# Patient Record
Sex: Female | Born: 1935 | Race: White | Hispanic: No | Smoking: Never smoker
Health system: Southern US, Community
[De-identification: ages and names within clinical notes are randomized; demographics above are authoritative.]

## PROBLEM LIST (undated history)

## (undated) DIAGNOSIS — I5032 Chronic diastolic (congestive) heart failure: Principal | ICD-10-CM

## (undated) DIAGNOSIS — N183 Chronic kidney disease, stage 3 (moderate): Secondary | ICD-10-CM

## (undated) DIAGNOSIS — G5600 Carpal tunnel syndrome, unspecified upper limb: Secondary | ICD-10-CM

## (undated) DIAGNOSIS — M75120 Complete rotator cuff tear or rupture of unspecified shoulder, not specified as traumatic: Secondary | ICD-10-CM

## (undated) DIAGNOSIS — S52023A Displaced fracture of olecranon process without intraarticular extension of unspecified ulna, initial encounter for closed fracture: Secondary | ICD-10-CM

## (undated) DIAGNOSIS — K5900 Constipation, unspecified: Secondary | ICD-10-CM

## (undated) DIAGNOSIS — K219 Gastro-esophageal reflux disease without esophagitis: Secondary | ICD-10-CM

## (undated) DIAGNOSIS — D649 Anemia, unspecified: Secondary | ICD-10-CM

## (undated) HISTORY — PX: SHOULDER SURGERY: SHX246

## (undated) HISTORY — PX: HERNIA REPAIR: SHX51

## (undated) HISTORY — DX: Constipation, unspecified: K59.00

## (undated) HISTORY — DX: Complete rotator cuff tear or rupture of unspecified shoulder, not specified as traumatic: M75.120

## (undated) HISTORY — DX: Chronic diastolic (congestive) heart failure: I50.32

## (undated) HISTORY — DX: Gastro-esophageal reflux disease without esophagitis: K21.9

## (undated) HISTORY — PX: OTHER SURGICAL HISTORY: SHX169

## (undated) HISTORY — PX: TONSILLECTOMY AND ADENOIDECTOMY: SUR1326

## (undated) HISTORY — DX: Carpal tunnel syndrome, unspecified upper limb: G56.00

## (undated) HISTORY — PX: CATARACT EXTRACTION: SUR2

## (undated) HISTORY — DX: Displaced fracture of olecranon process without intraarticular extension of unspecified ulna, initial encounter for closed fracture: S52.023A

## (undated) HISTORY — DX: Chronic kidney disease, stage 3 (moderate): N18.3

## (undated) HISTORY — PX: ELBOW SURGERY: SHX618

## (undated) HISTORY — DX: Anemia, unspecified: D64.9

## (undated) HISTORY — PX: HEMORROIDECTOMY: SUR656

## (undated) HISTORY — PX: APPENDECTOMY: SHX54

## (undated) HISTORY — PX: BREAST LUMPECTOMY: SHX2

---

## 1997-12-03 ENCOUNTER — Inpatient Hospital Stay (HOSPITAL_COMMUNITY): Admission: EM | Admit: 1997-12-03 | Discharge: 1997-12-08 | Payer: Self-pay | Admitting: Emergency Medicine

## 1997-12-08 ENCOUNTER — Inpatient Hospital Stay (HOSPITAL_COMMUNITY)
Admission: RE | Admit: 1997-12-08 | Discharge: 1997-12-15 | Payer: Self-pay | Admitting: Physical Medicine and Rehabilitation

## 1999-07-04 ENCOUNTER — Other Ambulatory Visit: Admission: RE | Admit: 1999-07-04 | Discharge: 1999-07-04 | Payer: Self-pay | Admitting: Emergency Medicine

## 1999-07-11 ENCOUNTER — Encounter: Admission: RE | Admit: 1999-07-11 | Discharge: 1999-07-11 | Payer: Self-pay | Admitting: Emergency Medicine

## 1999-07-11 ENCOUNTER — Encounter: Payer: Self-pay | Admitting: Emergency Medicine

## 2000-06-10 ENCOUNTER — Encounter: Payer: Self-pay | Admitting: Ophthalmology

## 2000-06-12 ENCOUNTER — Ambulatory Visit (HOSPITAL_COMMUNITY): Admission: RE | Admit: 2000-06-12 | Discharge: 2000-06-12 | Payer: Self-pay | Admitting: Ophthalmology

## 2001-01-22 ENCOUNTER — Ambulatory Visit (HOSPITAL_COMMUNITY): Admission: RE | Admit: 2001-01-22 | Discharge: 2001-01-23 | Payer: Self-pay | Admitting: Orthopaedic Surgery

## 2001-03-03 ENCOUNTER — Encounter (HOSPITAL_COMMUNITY): Admission: RE | Admit: 2001-03-03 | Discharge: 2001-04-02 | Payer: Self-pay | Admitting: Orthopaedic Surgery

## 2001-04-05 ENCOUNTER — Encounter (HOSPITAL_COMMUNITY): Admission: RE | Admit: 2001-04-05 | Discharge: 2001-05-05 | Payer: Self-pay | Admitting: Orthopaedic Surgery

## 2003-11-09 ENCOUNTER — Encounter: Admission: RE | Admit: 2003-11-09 | Discharge: 2003-11-09 | Payer: Self-pay | Admitting: Emergency Medicine

## 2003-11-21 ENCOUNTER — Encounter: Admission: RE | Admit: 2003-11-21 | Discharge: 2003-11-21 | Payer: Self-pay | Admitting: Emergency Medicine

## 2003-11-21 ENCOUNTER — Encounter (INDEPENDENT_AMBULATORY_CARE_PROVIDER_SITE_OTHER): Payer: Self-pay | Admitting: *Deleted

## 2003-11-30 ENCOUNTER — Ambulatory Visit (HOSPITAL_COMMUNITY): Admission: RE | Admit: 2003-11-30 | Discharge: 2003-11-30 | Payer: Self-pay | Admitting: General Surgery

## 2003-11-30 ENCOUNTER — Encounter (INDEPENDENT_AMBULATORY_CARE_PROVIDER_SITE_OTHER): Payer: Self-pay | Admitting: *Deleted

## 2003-11-30 ENCOUNTER — Encounter: Admission: RE | Admit: 2003-11-30 | Discharge: 2003-11-30 | Payer: Self-pay | Admitting: General Surgery

## 2003-11-30 ENCOUNTER — Ambulatory Visit (HOSPITAL_BASED_OUTPATIENT_CLINIC_OR_DEPARTMENT_OTHER): Admission: RE | Admit: 2003-11-30 | Discharge: 2003-11-30 | Payer: Self-pay | Admitting: General Surgery

## 2004-01-05 ENCOUNTER — Encounter: Admission: RE | Admit: 2004-01-05 | Discharge: 2004-01-05 | Payer: Self-pay | Admitting: Oncology

## 2004-02-14 ENCOUNTER — Encounter: Admission: RE | Admit: 2004-02-14 | Discharge: 2004-02-14 | Payer: Self-pay | Admitting: Emergency Medicine

## 2004-04-29 ENCOUNTER — Ambulatory Visit: Payer: Self-pay | Admitting: Oncology

## 2004-08-25 ENCOUNTER — Inpatient Hospital Stay (HOSPITAL_COMMUNITY): Admission: EM | Admit: 2004-08-25 | Discharge: 2004-08-26 | Payer: Self-pay | Admitting: Emergency Medicine

## 2004-11-11 ENCOUNTER — Encounter: Admission: RE | Admit: 2004-11-11 | Discharge: 2004-11-11 | Payer: Self-pay | Admitting: Oncology

## 2004-12-13 ENCOUNTER — Ambulatory Visit: Payer: Self-pay | Admitting: Oncology

## 2005-06-05 ENCOUNTER — Ambulatory Visit: Payer: Self-pay | Admitting: Oncology

## 2005-06-20 ENCOUNTER — Encounter: Admission: RE | Admit: 2005-06-20 | Discharge: 2005-06-20 | Payer: Self-pay | Admitting: Emergency Medicine

## 2005-09-14 ENCOUNTER — Ambulatory Visit: Payer: Self-pay | Admitting: Oncology

## 2005-09-19 LAB — PROTIME-INR
INR: 3.1 (ref 2.00–3.50)
Protime: 21.3 Seconds (ref 10.6–13.4)

## 2005-09-19 LAB — CBC WITH DIFFERENTIAL/PLATELET
BASO%: 0.8 % (ref 0.0–2.0)
Eosinophils Absolute: 0.1 10*3/uL (ref 0.0–0.5)
MCHC: 35.4 g/dL (ref 32.0–36.0)
MONO#: 0.5 10*3/uL (ref 0.1–0.9)
NEUT#: 3.3 10*3/uL (ref 1.5–6.5)
RBC: 3.78 10*6/uL (ref 3.70–5.32)
RDW: 11 % — ABNORMAL LOW (ref 11.3–14.5)
WBC: 5.4 10*3/uL (ref 3.9–10.0)
lymph#: 1.5 10*3/uL (ref 0.9–3.3)

## 2005-11-12 ENCOUNTER — Encounter: Admission: RE | Admit: 2005-11-12 | Discharge: 2005-11-12 | Payer: Self-pay | Admitting: Oncology

## 2005-11-21 ENCOUNTER — Encounter: Admission: RE | Admit: 2005-11-21 | Discharge: 2005-11-21 | Payer: Self-pay | Admitting: Emergency Medicine

## 2006-02-02 ENCOUNTER — Ambulatory Visit: Payer: Self-pay | Admitting: Oncology

## 2006-02-16 ENCOUNTER — Encounter: Admission: RE | Admit: 2006-02-16 | Discharge: 2006-02-16 | Payer: Self-pay | Admitting: Oncology

## 2006-08-10 ENCOUNTER — Ambulatory Visit: Payer: Self-pay | Admitting: Oncology

## 2006-08-13 LAB — LACTATE DEHYDROGENASE: LDH: 143 U/L (ref 94–250)

## 2006-08-13 LAB — CBC WITH DIFFERENTIAL/PLATELET
BASO%: 0.5 % (ref 0.0–2.0)
LYMPH%: 21.5 % (ref 14.0–48.0)
MCHC: 35.5 g/dL (ref 32.0–36.0)
MONO#: 0.6 10*3/uL (ref 0.1–0.9)
Platelets: 162 10*3/uL (ref 145–400)
RBC: 3.92 10*6/uL (ref 3.70–5.32)
RDW: 13.4 % (ref 11.3–14.5)
WBC: 7.8 10*3/uL (ref 3.9–10.0)
lymph#: 1.7 10*3/uL (ref 0.9–3.3)

## 2006-08-13 LAB — COMPREHENSIVE METABOLIC PANEL
ALT: 8 U/L (ref 0–35)
CO2: 25 mEq/L (ref 19–32)
Potassium: 4.2 mEq/L (ref 3.5–5.3)
Sodium: 138 mEq/L (ref 135–145)
Total Bilirubin: 0.5 mg/dL (ref 0.3–1.2)
Total Protein: 6.3 g/dL (ref 6.0–8.3)

## 2006-08-13 LAB — CANCER ANTIGEN 27.29: CA 27.29: 16 U/mL (ref 0–39)

## 2006-08-26 LAB — BASIC METABOLIC PANEL
Chloride: 107 mEq/L (ref 96–112)
Creatinine, Ser: 0.86 mg/dL (ref 0.40–1.20)
Potassium: 4.4 mEq/L (ref 3.5–5.3)
Sodium: 141 mEq/L (ref 135–145)

## 2006-11-18 ENCOUNTER — Encounter: Admission: RE | Admit: 2006-11-18 | Discharge: 2006-11-18 | Payer: Self-pay | Admitting: Oncology

## 2007-02-16 ENCOUNTER — Ambulatory Visit: Payer: Self-pay | Admitting: Oncology

## 2007-02-18 LAB — CBC WITH DIFFERENTIAL/PLATELET
Basophils Absolute: 0 10*3/uL (ref 0.0–0.1)
Eosinophils Absolute: 0.1 10*3/uL (ref 0.0–0.5)
HCT: 36.6 % (ref 34.8–46.6)
HGB: 12.8 g/dL (ref 11.6–15.9)
MCV: 90 fL (ref 81.0–101.0)
MONO%: 8.6 % (ref 0.0–13.0)
NEUT#: 2.6 10*3/uL (ref 1.5–6.5)
NEUT%: 59 % (ref 39.6–76.8)
Platelets: 196 10*3/uL (ref 145–400)
RDW: 13.2 % (ref 11.3–14.5)

## 2007-02-18 LAB — COMPREHENSIVE METABOLIC PANEL
Albumin: 4 g/dL (ref 3.5–5.2)
Alkaline Phosphatase: 50 U/L (ref 39–117)
BUN: 19 mg/dL (ref 6–23)
Calcium: 9.1 mg/dL (ref 8.4–10.5)
Glucose, Bld: 98 mg/dL (ref 70–99)
Potassium: 4.4 mEq/L (ref 3.5–5.3)

## 2007-02-23 LAB — VITAMIN D PNL(25-HYDRXY+1,25-DIHY)-BLD
Vit D, 1,25-Dihydroxy: 36 pg/mL (ref 6–62)
Vit D, 25-Hydroxy: 42 ng/mL (ref 30–89)

## 2007-08-17 ENCOUNTER — Ambulatory Visit: Payer: Self-pay | Admitting: Oncology

## 2007-08-19 LAB — CBC WITH DIFFERENTIAL/PLATELET
Basophils Absolute: 0 10*3/uL (ref 0.0–0.1)
Eosinophils Absolute: 0.1 10*3/uL (ref 0.0–0.5)
HCT: 34.8 % (ref 34.8–46.6)
HGB: 12.2 g/dL (ref 11.6–15.9)
MCV: 89.5 fL (ref 81.0–101.0)
NEUT#: 3 10*3/uL (ref 1.5–6.5)
RDW: 13.6 % (ref 11.3–14.5)
lymph#: 1.4 10*3/uL (ref 0.9–3.3)

## 2007-08-20 LAB — LACTATE DEHYDROGENASE: LDH: 140 U/L (ref 94–250)

## 2007-08-20 LAB — VITAMIN D 25 HYDROXY (VIT D DEFICIENCY, FRACTURES): Vit D, 25-Hydroxy: 36 ng/mL (ref 30–89)

## 2007-08-20 LAB — COMPREHENSIVE METABOLIC PANEL
Albumin: 3.7 g/dL (ref 3.5–5.2)
BUN: 16 mg/dL (ref 6–23)
Calcium: 8.8 mg/dL (ref 8.4–10.5)
Chloride: 108 mEq/L (ref 96–112)
Glucose, Bld: 120 mg/dL — ABNORMAL HIGH (ref 70–99)
Potassium: 4 mEq/L (ref 3.5–5.3)

## 2007-11-19 ENCOUNTER — Encounter: Admission: RE | Admit: 2007-11-19 | Discharge: 2007-11-19 | Payer: Self-pay | Admitting: Oncology

## 2008-02-18 ENCOUNTER — Encounter: Admission: RE | Admit: 2008-02-18 | Discharge: 2008-02-18 | Payer: Self-pay | Admitting: Oncology

## 2008-02-24 ENCOUNTER — Ambulatory Visit: Payer: Self-pay | Admitting: Oncology

## 2008-02-28 LAB — COMPREHENSIVE METABOLIC PANEL
AST: 20 U/L (ref 0–37)
BUN: 18 mg/dL (ref 6–23)
Calcium: 9.1 mg/dL (ref 8.4–10.5)
Chloride: 107 mEq/L (ref 96–112)
Creatinine, Ser: 0.74 mg/dL (ref 0.40–1.20)
Total Bilirubin: 0.7 mg/dL (ref 0.3–1.2)

## 2008-02-28 LAB — CBC WITH DIFFERENTIAL/PLATELET
BASO%: 0.6 % (ref 0.0–2.0)
EOS%: 1.4 % (ref 0.0–7.0)
HCT: 35.1 % (ref 34.8–46.6)
LYMPH%: 23.6 % (ref 14.0–48.0)
MCH: 29.5 pg (ref 26.0–34.0)
MCHC: 33.5 g/dL (ref 32.0–36.0)
MCV: 88.3 fL (ref 81.0–101.0)
MONO%: 9.1 % (ref 0.0–13.0)
NEUT%: 65.3 % (ref 39.6–76.8)
Platelets: 216 10*3/uL (ref 145–400)
lymph#: 1.8 10*3/uL (ref 0.9–3.3)

## 2008-02-28 LAB — LACTATE DEHYDROGENASE: LDH: 134 U/L (ref 94–250)

## 2008-05-14 ENCOUNTER — Inpatient Hospital Stay (HOSPITAL_COMMUNITY): Admission: EM | Admit: 2008-05-14 | Discharge: 2008-05-17 | Payer: Self-pay | Admitting: Emergency Medicine

## 2008-11-20 ENCOUNTER — Encounter: Admission: RE | Admit: 2008-11-20 | Discharge: 2008-11-20 | Payer: Self-pay | Admitting: Oncology

## 2008-12-09 ENCOUNTER — Emergency Department (HOSPITAL_COMMUNITY): Admission: EM | Admit: 2008-12-09 | Discharge: 2008-12-09 | Payer: Self-pay | Admitting: Emergency Medicine

## 2008-12-29 ENCOUNTER — Emergency Department (HOSPITAL_COMMUNITY): Admission: EM | Admit: 2008-12-29 | Discharge: 2008-12-29 | Payer: Self-pay | Admitting: Emergency Medicine

## 2009-03-02 ENCOUNTER — Ambulatory Visit: Payer: Self-pay | Admitting: Oncology

## 2009-03-06 LAB — CBC WITH DIFFERENTIAL/PLATELET
Eosinophils Absolute: 0.1 10*3/uL (ref 0.0–0.5)
MONO#: 0.4 10*3/uL (ref 0.1–0.9)
MONO%: 8.7 % (ref 0.0–14.0)
NEUT#: 3.3 10*3/uL (ref 1.5–6.5)
RBC: 3.9 10*6/uL (ref 3.70–5.45)
RDW: 14.8 % — ABNORMAL HIGH (ref 11.2–14.5)
WBC: 5.1 10*3/uL (ref 3.9–10.3)

## 2009-03-07 LAB — COMPREHENSIVE METABOLIC PANEL
Albumin: 4.1 g/dL (ref 3.5–5.2)
Alkaline Phosphatase: 57 U/L (ref 39–117)
CO2: 22 mEq/L (ref 19–32)
Glucose, Bld: 100 mg/dL — ABNORMAL HIGH (ref 70–99)
Potassium: 4.3 mEq/L (ref 3.5–5.3)
Sodium: 142 mEq/L (ref 135–145)
Total Protein: 6.8 g/dL (ref 6.0–8.3)

## 2009-03-07 LAB — VITAMIN D 25 HYDROXY (VIT D DEFICIENCY, FRACTURES): Vit D, 25-Hydroxy: 57 ng/mL (ref 30–89)

## 2009-10-30 ENCOUNTER — Encounter: Payer: Self-pay | Admitting: Orthopedic Surgery

## 2009-11-06 ENCOUNTER — Emergency Department (HOSPITAL_COMMUNITY): Admission: EM | Admit: 2009-11-06 | Discharge: 2009-11-06 | Payer: Self-pay | Admitting: Emergency Medicine

## 2009-11-07 ENCOUNTER — Ambulatory Visit: Payer: Self-pay | Admitting: Orthopedic Surgery

## 2009-11-07 ENCOUNTER — Encounter (INDEPENDENT_AMBULATORY_CARE_PROVIDER_SITE_OTHER): Payer: Self-pay | Admitting: *Deleted

## 2009-11-07 DIAGNOSIS — S52023A Displaced fracture of olecranon process without intraarticular extension of unspecified ulna, initial encounter for closed fracture: Secondary | ICD-10-CM | POA: Insufficient documentation

## 2009-11-07 HISTORY — DX: Displaced fracture of olecranon process without intraarticular extension of unspecified ulna, initial encounter for closed fracture: S52.023A

## 2009-11-08 ENCOUNTER — Encounter: Payer: Self-pay | Admitting: Orthopedic Surgery

## 2009-11-09 ENCOUNTER — Ambulatory Visit (HOSPITAL_COMMUNITY): Admission: RE | Admit: 2009-11-09 | Discharge: 2009-11-09 | Payer: Self-pay | Admitting: Orthopedic Surgery

## 2009-11-09 ENCOUNTER — Ambulatory Visit: Payer: Self-pay | Admitting: Orthopedic Surgery

## 2009-11-12 ENCOUNTER — Telehealth: Payer: Self-pay | Admitting: Orthopedic Surgery

## 2009-11-13 ENCOUNTER — Ambulatory Visit: Payer: Self-pay | Admitting: Orthopedic Surgery

## 2009-11-13 ENCOUNTER — Telehealth: Payer: Self-pay | Admitting: Orthopedic Surgery

## 2009-11-20 ENCOUNTER — Ambulatory Visit: Payer: Self-pay | Admitting: Orthopedic Surgery

## 2009-11-22 ENCOUNTER — Telehealth: Payer: Self-pay | Admitting: Orthopedic Surgery

## 2009-12-06 ENCOUNTER — Ambulatory Visit: Payer: Self-pay | Admitting: Orthopedic Surgery

## 2009-12-10 ENCOUNTER — Encounter (INDEPENDENT_AMBULATORY_CARE_PROVIDER_SITE_OTHER): Payer: Self-pay | Admitting: *Deleted

## 2010-01-03 ENCOUNTER — Ambulatory Visit: Payer: Self-pay | Admitting: Orthopedic Surgery

## 2010-02-14 ENCOUNTER — Ambulatory Visit: Payer: Self-pay | Admitting: Orthopedic Surgery

## 2010-02-27 ENCOUNTER — Telehealth: Payer: Self-pay | Admitting: Orthopedic Surgery

## 2010-03-19 ENCOUNTER — Ambulatory Visit: Payer: Self-pay | Admitting: Orthopedic Surgery

## 2010-03-19 DIAGNOSIS — G5600 Carpal tunnel syndrome, unspecified upper limb: Secondary | ICD-10-CM | POA: Insufficient documentation

## 2010-03-19 DIAGNOSIS — M75120 Complete rotator cuff tear or rupture of unspecified shoulder, not specified as traumatic: Secondary | ICD-10-CM

## 2010-03-19 HISTORY — DX: Complete rotator cuff tear or rupture of unspecified shoulder, not specified as traumatic: M75.120

## 2010-05-12 ENCOUNTER — Encounter: Payer: Self-pay | Admitting: Emergency Medicine

## 2010-05-12 ENCOUNTER — Encounter: Payer: Self-pay | Admitting: Oncology

## 2010-05-22 ENCOUNTER — Encounter: Payer: Self-pay | Admitting: Oncology

## 2010-05-23 NOTE — Miscellaneous (Signed)
Summary: elbow brace adjustment  Clinical Lists Changes    elbow brace was tightened up, it was loose where swelling had went down

## 2010-05-23 NOTE — Letter (Signed)
Summary: History form  History form   Imported By: Jacklynn Ganong 11/09/2009 07:57:14  _____________________________________________________________________  External Attachment:    Type:   Image     Comment:   External Document

## 2010-05-23 NOTE — Assessment & Plan Note (Signed)
Summary: 4 WK RE-CK/XRAYS POST OP RT ELBOW 11/09/09/[facility]   Visit Type:  Follow-up Referring Nazyia Gaugh:  ap er Primary Noble Bodie:  Dr. Hughie Closs  CC:  post op elbow recheck.  History of Present Illness: 77 year old female followup visit for previous fracture fixation RIGHT elbow  status post treatment internal fixation RIGHT elbow with Acumed plate 1/47/82.  Today xrays.  Currently in a hinged brace locked 40-120  No pain now, doing well.  The incision looks great.  Her range of motion is  20-100  X-ray AP lateral RIGHT elbow Fracture fixation is intact fracture is healing wel  impression healing fracture with plate internal fixation  Remove brace exercises with physical therapist active assisted range of motion exercises hand wrist and elbow exercises with passive resistance exercises hand and wrist  Followup 6 weeks    Allergies: 1)  ! Aspirin 2)  ! Penicillin 3)  ! Codeine   Impression & Recommendations:  Problem # 1:  AFTERCARE FOLLOWING SURGERY INJURY AND TRAUMA (ICD-V58.43) Assessment Improved  Orders: Post-Op Check (95621) Elbow x-ray, 2 views (30865)  Problem # 2:  CLOSED FRACTURE OF OLECRANON PROCESS OF ULNA (ICD-813.01) Assessment: Improved  Orders: Post-Op Check (78469) Elbow x-ray, 2 views (62952)  Patient Instructions: 1)  Please schedule a follow-up appointment in 6 weeks  2)  -xrays again  3)  -remove the brace

## 2010-05-23 NOTE — Assessment & Plan Note (Signed)
Summary: POST OP 2/RE-CK/XRAYS/SURG RT ELBOW/11/09/09/[facility]   Visit Type:  Follow-up Referring Provider:  ap er Primary Provider:  Dr. Hughie Closs  CC:  post op 2.  History of Present Illness: I saw Hannah Arias in the office today for a followup visit.  She is a 77 years old woman with the complaint of:  post op 1 otif rt elbow.  11/09/09 DOS  status post treatment internal fixation RIGHT elbow with Acumed plate  Postop day 11.  Today staples out, xrays and brace.  Tylenol helps.  No injuries, using cane today left hand.  Incision is looking very nice she does have some ecchymosis surrounding the arm upper arm and lower arm.  There is some swelling in her hand it is moderate  Her x-ray was taken today and lateral of the elbow the fracture looks great hardware looks great  Hinge brace at 50-120    Allergies: 1)  ! Aspirin 2)  ! Penicillin 3)  ! Codeine   Impression & Recommendations:  Problem # 1:  AFTERCARE FOLLOWING SURGERY INJURY AND TRAUMA (ICD-V58.43)  adv brace nxt visit  Orders: Post-Op Check (56433) Elbow x-ray, 2 views (29518)  Problem # 2:  CLOSED FRACTURE OF OLECRANON PROCESS OF ULNA (ICD-813.01) Assessment: Comment Only  Orders: Post-Op Check (84166) Elbow x-ray, 2 views (06301)  Patient Instructions: 1)  Please schedule a follow-up appointment in 2 weeks. 2)  xrays again

## 2010-05-23 NOTE — Letter (Signed)
Summary: surgery order RT elbow sched 11/09/09  surgery order RT elbow sched 11/09/09   Imported By: Cammie Sickle 11/08/2009 09:03:47  _____________________________________________________________________  External Attachment:    Type:   Image     Comment:   External Document

## 2010-05-23 NOTE — Progress Notes (Signed)
Summary: call to patient's faily RE: Adl order  Phone Note Other Incoming   Caller: [facility] Summary of Call: Aishya from [facility] called to relay that the order rec'd for daily Adl's is not a covered benefit under insurance, without nursing or therapy.  I have left a msg w/patient's son at Cell # [phone] to discuss options of caregiver contact ph#'s, which would be on a self pay basis.  Initial call taken by: Cammie Sickle,  November 13, 2009 6:11 PM  Follow-up for Phone Call        Advised son - he said that they will continue to do the best they can, and are managing okay.  Follow-up by: Cammie Sickle,  November 14, 2009 5:20 PM

## 2010-05-23 NOTE — Miscellaneous (Signed)
Summary: Order for homecare,Adl's  Order for homecare,Adl's   Imported By: Cammie Sickle 11/13/2009 17:56:14  _____________________________________________________________________  External Attachment:    Type:   Image     Comment:   External Document

## 2010-05-23 NOTE — Progress Notes (Signed)
Summary: wants fluid pills called in  Phone Note Call from Patient   Summary of Call: Hannah Arias (2010-11-11) says her hand,feet and ankles are swollen, wants to know if you will call in some fluid pills for her .  She called  Dr. Dwana Melena and was told to call you.  She uses [facility] Her # [phone] Initial call taken by: Jacklynn Ganong,  November 22, 2009 11:10 AM  Follow-up for Phone Call        call her primary care doctor back and ask him to call and fluid pills he knows that I do not take care of fluid pills Follow-up by: Fuller Canada MD,  November 22, 2009 11:21 AM  Additional Follow-up for Phone Call Additional follow up Details #1::        Advised the patient of doctor's reply Additional Follow-up by: Jacklynn Ganong,  November 22, 2009 11:27 AM

## 2010-05-23 NOTE — Miscellaneous (Signed)
Summary: Home therapy order update  Home therapy order update   Imported By: Cammie Sickle 01/04/2010 15:22:48  _____________________________________________________________________  External Attachment:    Type:   Image     Comment:   External Document

## 2010-05-23 NOTE — Miscellaneous (Signed)
Visit Type:  new patient Referring Provider:  ap er Primary Provider:  Dr. Hughie Closs  CC:  right elbow.  History of Present Illness: I saw Hannah Arias in the office today for an initial visit.  She is a 77 years old woman with the complaint of:  right elbow pain.  DOI 11/06/09 fell.  Xrays [facility] 11/06/09 right elbow.  Norco 5 for pain from er; Omeprazole for acid reflux.  This is an 77 year old female who's had multiple surgeries some of which he can't remember but include breast surgery LEFT rotator cuff repair appendectomy "female surgery" and presents now after falling yesterday injuring her RIGHT elbow sustaining a comminuted olecranon fracture.  She complains of dull intermittent moderate pain associated with bruising no numbness.  I gave her options of surgical vs. nonsurgical treatment with nonsurgical treatment and doing up with weakness and poor use of the RIGHT upper extremity.  She opted for the surgical treatment.      Allergies (verified): 1)  ! Aspirin 2)  ! Penicillin 3)  ! Codeine  Past History:  Past Medical History: acid reflux  Past Surgical History: patient could not remember surgeries please see history  Family History: na  Social History: Patient is widowed.  retired no smoking some alcohol use some caffeine use 12 grade ed.  Review of Systems Constitutional:  Denies weight loss, weight gain, fever, chills, and fatigue. Cardiovascular:  Denies chest pain, palpitations, fainting, and murmurs. Respiratory:  Denies short of breath, wheezing, couch, tightness, pain on inspiration, and snoring . Gastrointestinal:  Denies heartburn, nausea, vomiting, diarrhea, constipation, and blood in your stools. Genitourinary:  Denies frequency, urgency, difficulty urinating, painful urination, flank pain, and bleeding in urine. Neurologic:  Denies numbness, tingling, unsteady gait, dizziness, tremors, and seizure. Musculoskeletal:  Denies joint pain, swelling,  instability, stiffness, redness, heat, and muscle pain. Endocrine:  Denies excessive thirst, exessive urination, and heat or cold intolerance. Psychiatric:  Denies nervousness, depression, anxiety, and hallucinations. Skin:  Denies changes in the skin, poor healing, rash, itching, and redness. HEENT:  Denies blurred or double vision, eye pain, redness, and watering. Immunology:  Denies seasonal allergies, sinus problems, and allergic to bee stings. Hemoatologic:  Denies easy bleeding and brusing.  Physical Exam  Msk:  The patient is well developed and nourished, with normal grooming and hygiene. The body habitus is  mild obesity Pulses:  pulses normal in all 4 extremities Extremities:  there is no malalignment contracture atrophy or subluxation in the LEFT upper extremity or lower extremities  The RIGHT upper extremity has normal flexion severe weakness and extension tenderness over the olecranon no instability Neurologic:  The coordination and sensation were normal  The reflexes were normal   Skin:  bruising and ecchymosis is noted over the RIGHT elbow Cervical Nodes:  no significant adenopathy Psych:  alert and cooperative; normal mood and affect; normal attention span and concentration   Impression & Recommendations:  Problem # 1:  CLOSED FRACTURE OF OLECRANON PROCESS OF ULNA (ICD-813.01) Assessment New  The x-rays were done at [facility]. The report and the films have been reviewed. she basically has an olecranon avulsion fracture with a second fracture in the shaft of the ulna  Recommend OPEN TREATMENT INTERNAL FIXATION RIGHT ELBOW,  and I would like to use an Acumed plate with backup fixation if needed using tension band technique  Informed consent process: I have discussed the procedure with the patient. I have answered their questions. The risks of bleeding, infection, nerve and  vascualr injury have been discussed. The diagnosis and reason for surgery have been explained. The  patient demonstrates understanding of this discussion. Specific to this procedure risks include: hardware problems such as prominence requiring removal, weakness in extension  Orders: New Patient Level IV (16109) Long Arm Splint (60454)      Signed by Fuller Canada MD on 11/07/2009 at 11:34 AM

## 2010-05-23 NOTE — Assessment & Plan Note (Signed)
Summary: [facility] ER FX ELBOW/BSF   Visit Type:  new patient Referring Provider:  ap er Primary Provider:  Dr. Hughie Closs  CC:  right elbow.  History of Present Illness: I saw Hannah Arias in the office today for an initial visit.  She is a 77 years old woman with the complaint of:  right elbow pain.  DOI 11/06/09 fell.  Xrays [facility] 11/06/09 right elbow.  Norco 5 for pain from er; Omeprazole for acid reflux.  This is an 77 year old female who's had multiple surgeries some of which he can't remember but include breast surgery LEFT rotator cuff repair appendectomy "female surgery" and presents now after falling yesterday injuring her RIGHT elbow sustaining a comminuted olecranon fracture.  She complains of dull intermittent moderate pain associated with bruising no numbness.  I gave her options of surgical vs. nonsurgical treatment with nonsurgical treatment and doing up with weakness and poor use of the RIGHT upper extremity.  She opted for the surgical treatment.      Allergies (verified): 1)  ! Aspirin 2)  ! Penicillin 3)  ! Codeine  Past History:  Past Medical History: acid reflux  Past Surgical History: patient could not remember surgeries please see history  Family History: na  Social History: Patient is widowed.  retired no smoking some alcohol use some caffeine use 12 grade ed.  Review of Systems Constitutional:  Denies weight loss, weight gain, fever, chills, and fatigue. Cardiovascular:  Denies chest pain, palpitations, fainting, and murmurs. Respiratory:  Denies short of breath, wheezing, couch, tightness, pain on inspiration, and snoring . Gastrointestinal:  Denies heartburn, nausea, vomiting, diarrhea, constipation, and blood in your stools. Genitourinary:  Denies frequency, urgency, difficulty urinating, painful urination, flank pain, and bleeding in urine. Neurologic:  Denies numbness, tingling, unsteady gait, dizziness, tremors, and seizure. Musculoskeletal:   Denies joint pain, swelling, instability, stiffness, redness, heat, and muscle pain. Endocrine:  Denies excessive thirst, exessive urination, and heat or cold intolerance. Psychiatric:  Denies nervousness, depression, anxiety, and hallucinations. Skin:  Denies changes in the skin, poor healing, rash, itching, and redness. HEENT:  Denies blurred or double vision, eye pain, redness, and watering. Immunology:  Denies seasonal allergies, sinus problems, and allergic to bee stings. Hemoatologic:  Denies easy bleeding and brusing.  Physical Exam  Msk:  The patient is well developed and nourished, with normal grooming and hygiene. The body habitus is  mild obesity Pulses:  pulses normal in all 4 extremities Extremities:  there is no malalignment contracture atrophy or subluxation in the LEFT upper extremity or lower extremities  The RIGHT upper extremity has normal flexion severe weakness and extension tenderness over the olecranon no instability Neurologic:  The coordination and sensation were normal  The reflexes were normal   Skin:  bruising and ecchymosis is noted over the RIGHT elbow Cervical Nodes:  no significant adenopathy Psych:  alert and cooperative; normal mood and affect; normal attention span and concentration   Impression & Recommendations:  Problem # 1:  CLOSED FRACTURE OF OLECRANON PROCESS OF ULNA (ICD-813.01) Assessment New  The x-rays were done at [facility]. The report and the films have been reviewed. she basically has an olecranon avulsion fracture with a second fracture in the shaft of the ulna  Recommend internal fixation and I would like to use an Acumed plate with backup fixation if needed using tension band technique  Informed consent process: I have discussed the procedure with the patient. I have answered their questions. The risks of bleeding, infection,  nerve and vascualr injury have been discussed. The diagnosis and reason for surgery have been explained. The  patient demonstrates understanding of this discussion. Specific to this procedure risks include: hardware problems such as prominence requiring removal, weakness in extension  Orders: New Patient Level IV (16109) Long Arm Splint (60454)  Patient Instructions: 1)  DOS 11/09/09 2)  Preop: I will call you and let you know when to go to [place] short stay for preop, take packet with you when you go. 3)  Post op 1 in our office 11/13/09 4)  stop aspirin today and any other blood thinners

## 2010-05-23 NOTE — Assessment & Plan Note (Signed)
Summary: 1 mo f/u/rt elbowhumana/bsf   Visit Type:  Follow-up Referring Remo Kirschenmann:  ap er Primary Timber Marshman:  Dr. Hughie Closs  CC:  post op elbow.  History of Present Illness: 77 year old female followup visit for previous fracture fixation RIGHT elbow  Problem list  #1 RIGHT olecranon fracture #2 carpal tunnel syndrome #3 RIGHT shoulder pain    status post Open treatment internal fixation RIGHT elbow with Acumed plate 2/95/18.  Today is 1 month follow up after OT./4 month followup after her surgery  Medication:Tylenol as needed helps  Complaint:Still some soreness.  Using cane today.  Has note from OT.  Has some right shoulder pain since therapy and her right CTS is being relieved by bracing at night.            Allergies (verified): 1)  ! Aspirin 2)  ! Penicillin 3)  ! Codeine  Past History:  Past medical, surgical, family and social histories (including risk factors) reviewed, and no changes noted (except as noted below).  Past Medical History: Reviewed history from 11/07/2009 and no changes required. acid reflux  Past Surgical History: Reviewed history from 11/07/2009 and no changes required. patient could not remember surgeries please see history  Family History: Reviewed history from 11/07/2009 and no changes required. na  Social History: Reviewed history from 11/07/2009 and no changes required. Patient is widowed.  retired no smoking some alcohol use some caffeine use 12 grade ed.  Review of Systems Neurologic:  carpal tunnel syndrome improved with carpal tunnel splint at night.  Physical Exam  Additional Exam:  the patient is awake alert and oriented x3.  Her grooming is excellent.  She has range of motion in the RIGHT she elbow 10-115  She has active shoulder forward elevation of only 80 and abduction only 80.  Passive range of motion RIGHT shoulder is 150.  The drop arm test is negative but she has giving way when lowering the  arm on her own.  She has obvious weakness of the supraspinatus.   Impression & Recommendations:  Problem # 1:  AFTERCARE FOLLOWING SURGERY INJURY AND TRAUMA (ICD-V58.43) Assessment Improved  Orders: Est. Patient Level III (84166)  Problem # 2:  CLOSED FRACTURE OF OLECRANON PROCESS OF ULNA (ICD-813.01) Assessment: Improved  Orders: Est. Patient Level III (06301)  Problem # 3:  RUPTURE ROTATOR CUFF (ICD-727.61) Assessment: New  I think she has a ruptured rotator cuff and she is not a surgical candidate at 77 years old  Recommend subacromial injection RIGHT shoulder Verbal consent obtained/The right shoulder was injected with depomedrol 40mg /cc and sensorcaine .25% . There were no complications  Orders: Est. Patient Level III (60109) Joint Aspirate / Injection, Large (20610) Depo- Medrol 40mg  (J1030)  Patient Instructions: 1)  You have received an injection of cortisone today. You may experience increased pain at the injection site. Apply ice pack to the area for 20 minutes every 2 hours and take 2 xtra strength tylenol every 8 hours. This increased pain will usually resolve in 24 hours. The injection will take effect in 3-10 days.  2)  Please schedule a follow-up appointment as needed.   Orders Added: 1)  Est. Patient Level III [32355] 2)  Joint Aspirate / Injection, Large [20610] 3)  Depo- Medrol 40mg  [J1030]

## 2010-05-23 NOTE — Assessment & Plan Note (Signed)
Summary: RE-CK/XRAY/POST OP SURG RT ELBOW/11/09/09/[facility]   Visit Type:  post op Referring Provider:  ap er Primary Provider:  Dr. Hughie Closs  CC:  right elbow fracture.  History of Present Illness: 77 year old female followup visit for previous fracture fixation RIGHT elbow  status post treatment internal fixation RIGHT elbow with Acumed plate  Today xrays.  Tylenol helps.  Currently in a hinged brace locked 40-120  X-rays today taken.  X-ray show fracture consolidating well with good fixation noted and no change  In races can advance 0-1 20 plate.  Patient advised to use a walker at the note and orders given for that  The specific therapy needed on the RIGHT arm right now just active range of motion exercises which were encouraged.  Followup 4 weeks repeat x-rays  Her incision looks good today.  Allergies: 1)  ! Aspirin 2)  ! Penicillin 3)  ! Codeine   Impression & Recommendations:  Problem # 1:  AFTERCARE FOLLOWING SURGERY INJURY AND TRAUMA (ICD-V58.43) Assessment Improved  Orders: Post-Op Check (16109) Elbow x-ray, 2 views (60454)  Problem # 2:  CLOSED FRACTURE OF OLECRANON PROCESS OF ULNA (ICD-813.01) Assessment: Improved  Orders: Elbow x-ray, 2 views (09811)  Patient Instructions: 1)  4 weeks xrays right elbow

## 2010-05-23 NOTE — Assessment & Plan Note (Signed)
Summary: POST OP 1/SURG RT ELBOW FX SURG,11/09/09/[facility]F   Visit Type:  Follow-up Referring Provider:  ap er Primary Provider:  Dr. Hughie Closs  CC:  post op 1 rt elbow.  History of Present Illness: I saw Hannah Arias in the office today for a followup visit.  She is a 77 years old woman with the complaint of:  post op 1 otif rt elbow.  status post treatment internal fixation RIGHT elbow with Acumed plate  Postop day 4  Her dressing change today  Norco made her sick and woozy she takes Tylenol  Incision looks good passive range of motion is 30-90  In one week for staples out and application of a hinged elbow brace  D  Allergies: 1)  ! Aspirin 2)  ! Penicillin 3)  ! Codeine   Impression & Recommendations:  Problem # 1:  CLOSED FRACTURE OF OLECRANON PROCESS OF ULNA (ICD-813.01)  Orders: Home Health Referral (Home Health) Post-Op Check [phone]) Long Arm Splint (98119)  Problem # 2:  AFTERCARE FOLLOWING SURGERY INJURY AND TRAUMA (ICD-V58.43)  application of long-arm splint  Orders: Post-Op Check (14782) Long Arm Splint (95621)  Patient Instructions: 1)  return next tues for xrays and splint

## 2010-05-23 NOTE — Progress Notes (Signed)
Summary: Hand hurts a lot   Phone Note Call from Patient   Summary of Call: Sheniya Garciaperez says at night her hand hurts a lot and goes to sleep.  Want's to know "what is going on", is this coming from her elbow? [phone] Initial call taken by: Jacklynn Ganong,  February 27, 2010 9:09 AM     Appended Document: Hand hurts a lot  advised to pick up CT splint

## 2010-05-23 NOTE — Assessment & Plan Note (Signed)
Summary: 6 WE XR RT ELBOW/BSF   Visit Type:  Follow-up Referring Provider:  ap er Primary Provider:  Dr. Hughie Closs  CC:  post op elbow.  History of Present Illness: 77 year old female followup visit for previous fracture fixation RIGHT elbow  status post treatment internal fixation RIGHT elbow with Acumed plate 8/41/32.  This is postop week number 14  She has couple of complaints today #1 she says her elbow is still sore #2 she still has residual weakness in her RIGHT upper extremity.  #3 she has decreased range of motion in her RIGHT shoulder.  Her incision healed great her range of motion is 5-118 in terms of elbow flexion.  She does have decreased flexion of the index and long finger.  The anterior interosseous nerve however appears to be intact.  X-rays were obtained of the RIGHT elbow.  There is a fractured olecranon with plate fixation and near anatomic alignment with slight depression of a portion of the joint surface.  Impression stable fixation RIGHT elbow  Recommended occupational therapy for one month and recheck for a final visit         Allergies: 1)  ! Aspirin 2)  ! Penicillin 3)  ! Codeine   Impression & Recommendations:  Problem # 1:  AFTERCARE FOLLOWING SURGERY INJURY AND TRAUMA (ICD-V58.43) Assessment Improved  Orders: Occupational Therapy (OT) Post-Op Check (44010) Elbow x-ray, 2 views (27253)  Problem # 2:  CLOSED FRACTURE OF OLECRANON PROCESS OF ULNA (ICD-813.01) Assessment: Improved  Orders: Occupational Therapy (OT) Post-Op Check (66440) Elbow x-ray, 2 views (34742)  Patient Instructions: 1)  Please schedule a follow-up 1 MONTH  2)  Continue therapy for 1 month   Orders Added: 1)  Occupational Therapy [OT] 2)  Post-Op Check [99024] 3)  Elbow x-ray, 2 views [73070]

## 2010-05-23 NOTE — Progress Notes (Signed)
Summary: call from patient son Rx question   Phone Note Call from Patient   Caller: Son Summary of Call: Patient's son called for patient to relay that he thinks the Hydrocodone is making her "out of her head".  Her first post op appointment is scheduled tomorrow 11/13/09.  Her pharmacy is [facility].  Please advise. Initial call taken by: Cammie Sickle,  November 12, 2009 10:27 AM  Follow-up for Phone Call        stop the norco    'take tylenol 500 mg q 6 hrs for pain  Follow-up by: Fuller Canada MD,  November 12, 2009 10:30 AM  Additional Follow-up for Phone Call Additional follow up Details #1::        Called and advised; phone call completed. Additional Follow-up by: Cammie Sickle,  November 12, 2009 10:43 AM

## 2010-05-23 NOTE — Miscellaneous (Signed)
Summary: No pre-auth required for out-patient procedure  Clinical Lists Changes  Call to insurer [facility] [employer] PPO; spoke w/Princess at [employer] [facility] [id number],              CPT [phone] / [phone]. Spoke w/Princess. Ref #  [id number]

## 2010-06-05 ENCOUNTER — Other Ambulatory Visit: Payer: Self-pay | Admitting: Oncology

## 2010-06-05 DIAGNOSIS — Z9889 Other specified postprocedural states: Secondary | ICD-10-CM

## 2010-06-17 ENCOUNTER — Ambulatory Visit
Admission: RE | Admit: 2010-06-17 | Discharge: 2010-06-17 | Disposition: A | Discharge status: Home / Self Care | Payer: Medicare HMO | Source: Ambulatory Visit | Attending: Oncology | Admitting: Oncology

## 2010-06-17 DIAGNOSIS — Z9889 Other specified postprocedural states: Secondary | ICD-10-CM

## 2010-06-20 ENCOUNTER — Ambulatory Visit (INDEPENDENT_AMBULATORY_CARE_PROVIDER_SITE_OTHER): Payer: Medicare HMO | Admitting: Orthopedic Surgery

## 2010-06-20 ENCOUNTER — Encounter: Payer: Self-pay | Admitting: Orthopedic Surgery

## 2010-06-20 DIAGNOSIS — G5600 Carpal tunnel syndrome, unspecified upper limb: Secondary | ICD-10-CM

## 2010-06-21 ENCOUNTER — Telehealth: Payer: Self-pay | Admitting: Orthopedic Surgery

## 2010-06-27 NOTE — Progress Notes (Signed)
Summary: Referral for NCS to Dr. Gerilyn Pilgrim.  Phone Note Outgoing Call   Call placed by: Waldon Reining,  June 21, 2010 10:11 AM Call placed to: Specialist Action Taken: Information Sent Summary of Call: I faxed a referral for this patient to Dr. Gerilyn Pilgrim for a NCS of her upper extremities.

## 2010-06-27 NOTE — Assessment & Plan Note (Signed)
Summary: right cts, has brace not helping.cbt   Visit Type:  Follow-up Referring Provider:  [facility] Primary Provider:  Dr. Hughie Closs  CC:  cts.  History of Present Illness:   77 years old female, status post open treatment internal fixation, RIGHT elbow with plate presents back complaining of numbness in her RIGHT hand. Despite RIGHT wrist splint. She says the entire hand goes numb and she has pain. She is on Tylenol is not helping.  Review of systems she still has some discomfort over the RIGHT elbow when she sits it on the armrest.     Allergies: 1)  ! Aspirin 2)  ! Penicillin 3)  ! Codeine  Physical Exam  Additional Exam:  multiple deformities are noted in the RIGHT hand from arthritis. I cannot detect any sensory loss to soft touch. She has good color and capillary refill. She has decreased range of motion decreased grip strength.  Her RIGHT elbow has a small bursal sac over the olecranon.   Impression & Recommendations:  Problem # 1:  CARPAL TUNNEL SYNDROME, RIGHT (ICD-354.0) Assessment Unchanged nerve conduction study to confirm carpal tunnel syndrome and then make recommendations from there in the meantime start Neurontin 100 mg at bedtime continue bracing Orders: Neurology Referral (Neuro) Est. Patient Level III ([address])  Medications Added to Medication List This Visit: 1)  Neurontin 100 Mg Caps (Gabapentin) .Marland Kitchen.. 1 at bedtime  Patient Instructions: 1)  NCS for carpal tunnel  2)  take new meds for pain and numbness 3)  come back for results Prescriptions: NEURONTIN 100 MG CAPS (GABAPENTIN) 1 at bedtime  #30 x 0   Entered and Authorized by:   Fuller Canada MD   Signed by:   Fuller Canada MD on 06/20/2010   Method used:   Print then Give to Patient   RxID:   [id number] NEURONTIN 100 MG CAPS (GABAPENTIN) 1 at bedtime  #30 x 0   Entered and Authorized by:   Fuller Canada MD   Signed by:   Fuller Canada MD on 06/20/2010   Method used:   Print  then Give to Patient   RxID:   [id number]    Orders Added: 1)  Neurology Referral [Neuro] 2)  Est. Patient Level III [295[address]]

## 2010-07-06 LAB — CBC
MCH: 31 pg (ref 26.0–34.0)
RBC: 3.64 MIL/uL — ABNORMAL LOW (ref 3.87–5.11)

## 2010-07-06 LAB — BASIC METABOLIC PANEL
CO2: 26 mEq/L (ref 19–32)
Calcium: 9 mg/dL (ref 8.4–10.5)
Creatinine, Ser: 0.86 mg/dL (ref 0.4–1.2)
GFR calc Af Amer: >60 mL/min (ref >60)
GFR calc non Af Amer: >60 mL/min (ref >60)
Sodium: 132 mEq/L — ABNORMAL LOW (ref 135–145)

## 2010-07-06 LAB — GLUCOSE, CAPILLARY: Glucose-Capillary: 116 mg/dL — ABNORMAL HIGH (ref 70–99)

## 2010-07-06 LAB — SURGICAL PCR SCREEN
MRSA, PCR: NEGATIVE
Staphylococcus aureus: NEGATIVE

## 2010-07-22 ENCOUNTER — Other Ambulatory Visit: Payer: Self-pay | Admitting: *Deleted

## 2010-07-22 DIAGNOSIS — M79603 Pain in arm, unspecified: Secondary | ICD-10-CM

## 2010-07-22 MED ORDER — GABAPENTIN 100 MG PO CAPS: 100.0000 mg | ORAL_CAPSULE | Freq: Every day | ORAL | Status: DC

## 2010-08-05 ENCOUNTER — Encounter: Payer: Self-pay | Admitting: Orthopedic Surgery

## 2010-08-05 LAB — CBC
HCT: 33.9 % — ABNORMAL LOW (ref 36.0–46.0)
Hemoglobin: 11.4 g/dL — ABNORMAL LOW (ref 12.0–15.0)
MCHC: 32.9 g/dL (ref 30.0–36.0)
MCV: 89.3 fL (ref 78.0–100.0)
Platelets: 168 10*3/uL (ref 150–400)
RBC: 3.89 MIL/uL (ref 3.87–5.11)
RDW: 13.8 % (ref 11.5–15.5)

## 2010-08-05 LAB — URINALYSIS, ROUTINE W REFLEX MICROSCOPIC
Bilirubin Urine: NEGATIVE
Nitrite: NEGATIVE
Protein, ur: NEGATIVE mg/dL
Urobilinogen, UA: 0.2 mg/dL (ref 0.0–1.0)

## 2010-08-05 LAB — DIFFERENTIAL
Basophils Absolute: 0 10*3/uL (ref 0.0–0.1)
Basophils Relative: 0 % (ref 0–1)
Eosinophils Absolute: 0.1 10*3/uL (ref 0.0–0.7)
Eosinophils Relative: 0 % (ref 0–5)
Eosinophils Relative: 1 % (ref 0–5)
Lymphocytes Relative: 14 % (ref 12–46)
Monocytes Absolute: 1 10*3/uL (ref 0.1–1.0)
Monocytes Relative: 13 % — ABNORMAL HIGH (ref 3–12)

## 2010-08-05 LAB — BASIC METABOLIC PANEL
BUN: 17 mg/dL (ref 6–23)
CO2: 25 mEq/L (ref 19–32)
Calcium: 8.6 mg/dL (ref 8.4–10.5)
Chloride: 105 mEq/L (ref 96–112)
GFR calc Af Amer: >60 mL/min (ref >60)
GFR calc non Af Amer: >60 mL/min (ref >60)
Glucose, Bld: 118 mg/dL — ABNORMAL HIGH (ref 70–99)
Potassium: 4.1 mEq/L (ref 3.5–5.1)
Sodium: 136 mEq/L (ref 135–145)

## 2010-08-05 LAB — SEDIMENTATION RATE: Sed Rate: 58 mm/hr — ABNORMAL HIGH (ref 0–22)

## 2010-08-05 LAB — URINE MICROSCOPIC-ADD ON

## 2010-08-14 ENCOUNTER — Ambulatory Visit (INDEPENDENT_AMBULATORY_CARE_PROVIDER_SITE_OTHER): Payer: Medicare HMO | Admitting: Orthopedic Surgery

## 2010-08-14 DIAGNOSIS — G5600 Carpal tunnel syndrome, unspecified upper limb: Secondary | ICD-10-CM

## 2010-08-14 NOTE — Progress Notes (Signed)
77 years old status post open treatment internal fixation RIGHT elbow for olecranon fracture with plate and screw construct  Developed carpal tunnel syndrome had nerve conduction study which was positive for carpal tunnel syndrome as well as axonal myelin disease.  Also underlying mixed sensory demyelinating polyneuropathy  Review of systems currently negative  Patient does complain of severe pain at night sometimes causing her cry.  She says that when she does her crocheting her symptoms get worse.  I reviewed the report and the study as  Dictated by Dr. Donzetta Starch Discussed with patient options of continued nonoperative treatment vs. Surgical treatment.  She wants to go ahead with the surgery.  I explained the postoperative course and expected outcome with a 75% chance of improvement.  She will discuss this with her family and then they will get back to [place] on scheduling surgery

## 2010-08-14 NOTE — Patient Instructions (Signed)
Carpal Tunnel Syndrome Fact Sheet You are working at your desk, trying to ignore the tingling or numbness you have had for months in your hand and wrist. Suddenly, a sharp, piercing pain shoots through the wrist and up your arm. Just a passing cramp? More likely you have carpal tunnel syndrome. This is a painful progressive condition caused by compression of a key nerve in the wrist. WHAT IS CARPAL TUNNEL SYNDROME? Carpal tunnel syndrome occurs when the nerve that runs from the forearm into the hand (median nerve), becomes pressed or squeezed at the wrist. The median nerve controls sensations to the palm side of the thumb and fingers (although not the little finger). That nerve also controls impulses to some small muscles in the hand, that allow the fingers and thumb to move. The narrow, rigid passageway of ligament and bones at the base of the hand (carpal tunnel) houses the median nerve and tendons. Sometimes, thickening from irritated tendons or other swelling narrows the tunnel. The narrowing causes the median nerve to be compressed. The result may be pain, weakness, or numbness in the hand and wrist that moves (radiates) up the arm. Although painful sensations may indicate other conditions, carpal tunnel syndrome is the most common and widely known of the entrapment neuropathies. These are conditions in which the body's peripheral nerves are compressed or traumatized. SYMPTOMS  Symptoms usually start gradually, with:   Frequent burning.   Tingling.   Itching numbness in the palm of the hand and the fingers.  This is especially true in the thumb and the index and middle fingers.  Some carpal tunnel sufferers say their fingers feel useless and swollen. They may feel this way even though little or no swelling is apparent.   The symptoms often first appear in one or both hands during the night, since many people sleep with flexed wrists. A person with carpal tunnel syndrome may wake up feeling the  need to shake out the hand or wrist.   As symptoms worsen, people might feel tingling during the day.   Decreased grip strength may make it difficult to:   Form a fist.   Grasp small objects.   Perform other manual tasks.  In chronic and/or untreated cases, the muscles at the base of the thumb may waste away.  Some people are unable to tell between hot and cold by touch.  CAUSES  Carpal tunnel syndrome is often the result of a combination of factors that increase pressure on the median nerve and tendons in the carpal tunnel. It is usually not a problem with the nerve itself.   Most likely the disorder is due to a congenital predisposition. This means that the carpal tunnel is simply smaller in some people than in others.   Other factors include:   Trauma or injury to the wrist that cause swelling, such as sprain or fracture.   Overactivity of the pituitary gland.   Hypothyroidism.   Rheumatoid arthritis.   Mechanical problems in the wrist joint.   Work stress.   Repeated use of vibrating hand tools.   Fluid retention during pregnancy or menopause.   The development of a cyst or tumor in the canal.   In some cases, no cause can be identified.   There is little clinical data to prove whether repetitive and forceful movements of the hand and wrist during work or leisure activities can cause carpal tunnel syndrome. Repeated motions performed in the course of normal work or other daily activities can  result in repetitive motion disorders such as bursitis and tendonitis. Writer's cramp, a condition in which a lack of fine motor skill coordination and ache and pressure in the fingers, wrist, or forearm is brought on by repetitive activity, is not a symptom of carpal tunnel syndrome.  WHO IS AT RISK OF DEVELOPING CARPAL TUNNEL SYNDROME?  Women are three times more likely than men to develop carpal tunnel syndrome. This may be the result of the carpal tunnel being smaller in  women.   The dominant hand is usually affected first and produces the most severe pain.   Diabetes or other metabolic disorders that directly affect the body's nerves make people more susceptible to compression.   Carpal tunnel syndrome usually occurs only in adults.   The risk of developing carpal tunnel syndrome is not confined to people in a single industry or job. However, it is especially common in those performing assembly line work, such as:   Set designer.   Sewing.    Finishing.     Cleaning.   Meat packing.      Carpal tunnel syndrome is three times more common among assemblers than among data-entry personnel. A 2001 study by the [facility] found heavy computer use (up to 7 hours a day) did not increase a person's risk of developing carpal tunnel syndrome.  During 1998, an estimated three of every 10,000 workers lost time from work because of carpal tunnel syndrome. Half of these workers missed more than 10 days of work. The average lifetime cost of carpal tunnel syndrome is estimated to be about $30,000 for each injured worker. This includes medical bills and lost time from work  DIAGNOSIS  Early diagnosis and treatment are important to avoid permanent damage to the median nerve.   A physical examination of the hands, arms, shoulders, and neck can help determine if the patient's complaints are related to daily activities or to an underlying disorder. An exam can rule out other painful conditions that mimic carpal tunnel syndrome.   The wrist is examined for:   Tenderness.   Swelling.     Warmth.   Discoloration.      Each finger should be tested for sensation. The muscles at the base of the hand should be examined for strength and signs of shrinking (atrophy).   Routine laboratory tests and X-rays can reveal:   Diabetes.   Arthritis.   Fractures.   Physicians can use specific tests to try to produce the symptoms of carpal tunnel syndrome.   In the  Tinel test, the caregiver taps or presses on the median nerve in the patient's wrist. The test is positive when tingling in the fingers or a shock-like sensation occurs.   The Phalen or wrist-flexion test involves having the patient hold their forearms upright by pointing the fingers down and pressing the backs of the hands together. The presence of carpal tunnel syndrome is suggested if one or more symptoms is felt in the fingers within 1 minute.   Caregivers may also ask patients to try to make a movement that brings on symptoms.   Often it is necessary to confirm the diagnosis by use of electrodiagnostic tests.   In a nerve conduction study, electrodes are placed on the hand and wrist. Small electric shocks are applied and the speed with which nerves transmit impulses is measured.   In electromyography, a fine needle is inserted into a muscle. The electrical activity is viewed on a screen. The activity can determine the  severity of damage to the median nerve.   Ultrasound imaging can show impaired movement of the median nerve.  TREATMENT Treatments for carpal tunnel syndrome should begin as early as possible. It should be done under a caregiver's direction. Underlying causes such as diabetes or arthritis should be treated first. Initial treatment generally involves:  Resting the affected hand and wrist for at least 2 weeks.   Avoiding activities that may worsen symptoms.   Immobilizing (keeping from moving) the wrist in a splint.  These things are all done to avoid further damage from twisting or bending. If there is inflammation, applying cool packs can help reduce swelling. NON-SURGICAL  Drugs - In special circumstances, different drugs can ease the pain and swelling.   Nonsteroidal anti-inflammatory drugs, such as aspirin, and other non-prescription pain relievers, may ease symptoms.   Orally administered diuretics (water pills) can decrease swelling.   Corticosteroids such as  prednisone or lidocaine can be injected directly into the wrist or taken by mouth. This can relieve pressure on the median nerve. It may provide immediate, but temporary relief to persons with mild or intermittent symptoms. (Caution: persons with diabetes and those who may be predisposed to diabetes should note that long term use of corticosteroids can make it difficult to regulate insulin levels. Corticosterioids should not be taken without a prescription.)   Additionally, some studies show that vitamin B6 (pyridoxine) supplements may ease the symptoms of carpal tunnel syndrome.   Exercise - Stretching and strengthening exercises can be helpful in people whose symptoms have decreased. These exercises may be supervised by a physical therapist that is trained to use exercises to treat physical impairments. The exercises can also be supervised by an occupational therapist. This is someone who is trained in evaluating people with physical impairments. They will help them build skills to improve their health and well-being.   Alternative therapies - Acupuncture and chiropractic care have helped some patients. Their effectiveness remains unproved. An exception is yoga, which has been shown to reduce pain and improve grip strength among patients with carpal tunnel syndrome.  SURGERY Carpal tunnel release is a surgical procedure commonly used. It is generally recommended if symptoms last for 6 months or more. This surgery involves cutting the band of tissue around the wrist to reduce pressure on the median nerve. Surgery is done under local anesthesia. It does not require an overnight hospital stay. Many patients require surgery on both hands. The following are types of carpal tunnel release surgery:  Open release surgery. This is the traditional procedure used to correct carpal tunnel syndrome. It consists of making a cut (incision) up to 2 inches in the wrist. Then, the surgeon cuts the carpal ligament to  enlarge the carpal tunnel. The procedure is generally done under local anesthesia on an outpatient basis. The only exception is if there are unusual medical considerations.   Endoscopic surgery may allow faster recovery and less postoperative discomfort than traditional open release surgery. The surgeon makes two incisions (about 1/2" each) in the wrist and palm. The surgeon will:   Insert a camera attached to a tube.   Observe the tissue on a screen.   Cut the carpal ligament (the tissue that holds joints together).  This two-portal endoscopic surgery, generally performed under local anesthesia, is effective and minimizes scarring and scar tenderness. One-portal endoscopic surgery for carpal tunnel syndrome is also available. Although symptoms may be relieved immediately after surgery, full recovery from carpal tunnel surgery can take months. Some  patients may have:  Infection.   Nerve damage.     Stiffness.   Pain at the scar.     Occasionally, the wrist loses strength because the carpal ligament is cut. Patients should undergo physical therapy after surgery to restore wrist strength. Some patients may need to adjust job duties or even change jobs after recovery from surgery. Recurrence of carpal tunnel syndrome following treatment is rare. The majority of patients recover completely. PREVENTION At the workplace, workers can:    Do on-the-job conditioning, perform stretching exercises.   Take frequent rest breaks.   Wear splints to keep wrists straight.   Use correct posture and wrist position.   Wearing finger-less gloves can help keep hands warm and flexible.   Design workstations, tools and tool handles, and tasks to enable the worker's wrist to maintain a natural position.   Rotate jobs among workers.   Employers can develop programs in ergonomics. This is the process of adapting workplace conditions and job demands to the capabilities of workers. However, research has not  conclusively shown that these workplace changes prevent the occurrence of carpal tunnel syndrome.  WHAT RESEARCH IS BEING DONE? The [employer] of Neurological Disorders and Stroke (NINDS), a part of the [employer], is the federal government's leading supporter of research on carpal tunnel syndrome. Scientists are studying the order of events that occur with carpal tunnel syndrome. They do this to better understand, treat, and prevent this ailment.   WHAT IS PERCUTANEOUS BALLOON CARPAL TUNNEL-PLASTY? Percutaneous balloon carpal tunnel-plasty is an experimental technique that can ease carpal tunnel pain without cutting the carpal ligament. In this procedure, a 1/4 -inch cut is made at the base of the palm. The caregiver then inserts a balloon through a catheter under the carpal ligament and inflates the balloon to stretch the ligament and free the nerve. Patients in one small study of this procedure reported relief of symptoms with no complications after the surgery. Most of them were back to work within 2 weeks. This experimental technique is not yet widely available. WHERE CAN I GO FOR MORE INFORMATION? For more information about carpal tunnel syndrome or other neuropathies of the nervous system, you may wish to contact: American Academy of Orthopaedic Surgeons  [address] [place], [place] [address] [phone] 800) 346-AAOS ([phone]) ttp://www.aaos.org   Centers for Disease Control and Prevention (CDCP) S. Department of Health and [employer]   [address] [place].   [place], [place] [address]   inquiry@cdc .gov   [employer].co.uk Tel: [phone], [phone], [phone]   [facility] and [facility] and Skin Diseases (NIAMS) [employer] [place], [place]   [address] [place]   [place] [address]   [email or web address]/   http://www.niams.http://www.myers.net/ Tel: [phone], 877-22-NIAMS [phone])   American  Chronic Pain Association (ACPA)  [address] [place], [place] [address]   ACPA@pacbell .net   http://www.theacpa.org Tel: [phone])[phone], [phone] Fax: [phone]   National Chronic Pain Outreach Association (NCPOA) [address]   [place], [place] [address]   ncpoa@cfw .com   http://www.chronicpain.org Tel: [phone]   Fax: [phone]   Occupational Safety & Health Administration U.S. Department of Labor   [address] [place], [place], [place] [address]   http://www.osha.gov Tel: 938-182-XHBZ [phone])    Document Released: 11/20/2003 Document Re-Released: 07/02/2009 [facility] Patient Information 2011 [place], [place].

## 2010-08-21 ENCOUNTER — Other Ambulatory Visit: Payer: Self-pay | Admitting: *Deleted

## 2010-08-21 DIAGNOSIS — M79603 Pain in arm, unspecified: Secondary | ICD-10-CM

## 2010-08-21 MED ORDER — GABAPENTIN 100 MG PO CAPS: 100.0000 mg | ORAL_CAPSULE | Freq: Every day | ORAL | Status: DC

## 2010-09-03 NOTE — H&P (Signed)
NAMEMARISSAH, Arias NO.:  [id number]   MEDICAL RECORD NO.:  [id number]          PATIENT TYPE:  INP   LOCATION:  A319                          FACILITY:  [facility]   PHYSICIAN:  Catalina Pizza, M.D.        DATE OF BIRTH:  July 13, 1910   DATE OF ADMISSION:  05/15/2008  DATE OF DISCHARGE:  LH                              HISTORY & PHYSICAL   CHIEF COMPLAINT:  Right thigh pain and inability to walk.   HISTORY OF PRESENT ILLNESS:  Hannah Arias is a 77 year old white female  who was in her usual state of health up until approximately 1 day prior  to admission, when she states that she had worsening pain in her right  thigh sharp stabbing in nature, which did not allow her to get a good  night's sleep.  She states that she had pain with even ambulating on her  right leg.  She apparently called some family members and was brought to  the emergency department for further assessment with this.  She was  admitted at 6:00 p.m. by Dr. Renard Matter; no note was done at that time so  much of the history is unable to obtain as no family with her at this  time.  Although Hannah Arias is very with it, I only got her side of  the story.  She apparently has had other significant issues with a  previous pelvic fracture on the left, and has had a DVT in her left leg  previously.  I have only seen the patient 1 or 2 times in the office  over the last 1 or 2 months, and do not have all of her old records for  complete history sake.   PAST MEDICAL HISTORY AND SURGICAL HISTORY:  As mentioned above:  1. Previous history of pelvic fracture on the left.  2. Lumpectomy for breast cancer.  3. Osteoporosis.  4. Hiatal hernia.  5. Left shoulder repair due to severe rotator cuff tear, in 2002.   ALLERGIES:  PENICILLIN and CODEINE.   MEDICATIONS ON ADMISSION:  1. Acetaminophen 500 mg q.6 h. p.r.n. for pain.  2. Calcium 1200 mg once daily.  3. Guaifenesin DM 200 mg once daily.  4. Multivitamin once daily.  5. She is also on, I believe, Zometa, which she states has been given      to her by her oncologist in [place].   SOCIAL HISTORY:  Lives by herself.  Does not smoke or drink.  No other  drug use.  She is functional with most of ADLs.   FAMILY HISTORY:  Noncontributory.   REVIEW OF SYSTEMS:  The patient denies any chest pains.  No problems  with her breathing.  No abdominal pains.  No problems with constipation;  has noted darker urine recently.  Denies any neurologic complaints  related to numbness or tingling or decreased sensation in arms or legs.  Denies any other neurologic issues.   PHYSICAL EXAMINATION:  VITAL SIGNS: Temperature 98.6, blood pressure  129/70, pulse 85, respirations 18, saturations 92% on room air.  GENERAL:  This is an elderly female sitting on the side of the bed, in  no acute distress.  HEENT:  Unremarkable.  No scleral icterus.  Oropharynx is clear.  Mucous  membranes are moist.  LUNGS:  Clear to auscultation bilaterally.  No rhonchi or wheezing.  HEART:  Regular rate and rhythm.  No murmurs, gallops or rubs.  ABDOMEN:  Protuberant, but soft and nontender.  EXTREMITIES:  There are 2+ pulses in all extremities.  MUSCULOSKELETAL: On the right leg she has some decreased range of motion  secondary to pain in her mid thigh; unable to palpate any mass or cord  specifically in this area.  The patient states that for whatever reason  it is a little bit better today than it was when she came into the  emergency department yesterday.  She feels a little bit stronger and  feels that she can bear weight a little bit more on that right leg than  previous.  NEUROLOGIC:  Alert and oriented x3.  No deficits noted.  SKIN:  Does have varicose-type veins in the lower extremities, but no  specific edema.   LABORATORY DATA:  CBC:  White count 7.7, hemoglobin 11.4, platelet count  167.  BMET:  Sodium 136, potassium 4.0, chloride 105, CO2 25, glucose  110, BUN 19, creatinine  0.76, calcium 8.5.  UA showed a trace blood, but  negative for everything else.  Urine microscopy shows rare squamous  epithelial; a few wbc's, rbc's 11-20 and a few bacteria noted.  X-ray of hip compete showed degenerative changes of the hips  bilaterally, without evidence of acute fracture dislocation; shows  osteopenia.   IMPRESSION:  This is a 77 year old white female with new onset of right  upper thigh pain, with difficulty with ambulation.   ASSESSMENT AND PLAN:  1. Right upper thigh pain with difficulty with ambulation:  There is      no skeletal-type deformity.  This very well could be arthritis-type      related.  There are no signs of DVT, although she has had a history      of this before.  Unclear whether this is related to her breast      cancer or therapy related with that, with previous history of      tamoxifen before.  She also notes that she has had pain in this      same area periodically for the last 2 months, ever since she has      gotten a Zometa injection.  Question whether this could be causing      some of bone to arthritic-type pain.  Given the fact that she has      improved a little bit clinically, we will get physical therapy to      assess how her mobility is.  We will not get orthopedics involved      yet, unless continues to worsen.  We will look for any other signs      of causes of pain in this right thigh.  We will possibly get a D-      dimer to look for any signs of DVT.  Unclear exactly what else to      scan at this time, since nothing specific is palpable and she is      clinically mildly improved today compared to before.  She may      require physical therapy at discharge, once the patient gets out of  the hospital.  2. History of breast cancer:  As mentioned above, I do not have all of      her medical history currently; but will try and attempt to get that      from her oncologist as far as who has prescribed her Zometa.  3. Very mild  hematuria:  On microscopy shows very few bacteria with a      few white cells.  The patient apparently is asymptomatic from this,      but is not having any specific dysuria.  But, if continues with the      pain may give a short course of antibiotics and see if this helps      clear up some inflammation symptoms, which could be worsening      arthritic-type pain in her right hip as a possible etiology.      Questioned.  4. Mild anemia:  She does not have her old records to compare to, but      her hemoglobin being 11.4.  Unclear whether this is related to just      nutrition.   DISPOSITION:  Clinically I am not sure exactly what causes pain, but it  is improving.  She may need to get to see Dr. Ophelia Charter, her orthopedic  doctor, if continues for any further input on this unusual type pain.      Catalina Pizza, M.D.  Electronically Signed     ZH/MEDQ  D:  05/15/2008  T:  05/15/2008  Job:  [id number]

## 2010-09-03 NOTE — Group Therapy Note (Signed)
NAMESAFIYYA, STOKES NO.:  [id number]   MEDICAL RECORD NO.:  [id number]          PATIENT TYPE:  INP   LOCATION:  A319                          FACILITY:  [facility]   PHYSICIAN:  Catalina Pizza, M.D.        DATE OF BIRTH:  [birth year]   DATE OF PROCEDURE:  05/16/2008  DATE OF DISCHARGE:                                 PROGRESS NOTE   SUBJECTIVE:  Ms. Carita Pian is a 77 year old white female who presented  with right thigh pain with significant difficulty with ambulation.  Overall, she states she is feeling much better related to this and able  to lift her leg more at this time.  She states the pain is much improved  as well, did sleep well last night and is eating well.  States she is  pretty much at her baseline.   PHYSICAL EXAMINATION:  VITAL SIGNS:  Temperature is 97.7, blood pressure  145/92, pulse 81, respirations 20, saturating 95% on room air.  GENERAL:  This is an elderly female sitting on the side of the bed in no  acute distress.  HEENT:  Is unremarkable.  LUNGS:  Clear to auscultation bilaterally.  HEART:  Regular rate and rhythm.  No murmurs, gallops or rubs.  ABDOMEN:  Soft, nontender, nondistended, positive bowel sounds.  EXTREMITIES:  Does have a trace lower extremity edema bilaterally with  varicose veins which is stable.  Minimal pain to palpation in her right  thigh.  Currently is able to raise her right leg to gravity as well as  strength wise approximately 4/5 as compared to her left leg but much  improved.  No other neurologic deficits noted.   LABORATORY DATA:  CBC shows a white count 9.8, hemoglobin of 11.3,  platelet count of 168.  BMET shows sodium 139, potassium 4.1, chloride  105, CO2 28, glucose 118, BUN 17, creatinine 0.74, calcium of 8.6.  Sed  rate was elevated at 58.   ASSESSMENT/PLAN:  Right thigh pain.  This is much improved.  Given her  inability to stand up and the pain, there is question whether this could  be a sign of polymyalgia  rheumatica with elevated sed rate.  Was started  on steroids yesterday and dramatically improved with her functionality.  Will continue on the steroids for now and continue as an outpatient,  likely on decreased dose, and we will recheck a sed rate in several  weeks to reassess.  Likely will need physical therapy upon discharge.  Plan will be to discharge the patient back to home tomorrow if no  further issues and will get physical therapy to assess her in-home given  her age and mild debility.      Catalina Pizza, M.D.  Electronically Signed     ZH/MEDQ  D:  05/16/2008  T:  05/17/2008  Job:  [id number]

## 2010-09-06 NOTE — Discharge Summary (Signed)
NAMETREVIA, NOP                 ACCOUNT NO.:  [id number]   MEDICAL RECORD NO.:  [id number]          PATIENT TYPE:  INP   LOCATION:  A319                          FACILITY:  [facility]   PHYSICIAN:  Catalina Pizza, M.D.        DATE OF BIRTH:  10-May-1910   DATE OF ADMISSION:  05/14/2008  DATE OF DISCHARGE:  01/27/2010LH                               DISCHARGE SUMMARY   DISCHARGE DIAGNOSES:  1. Right thigh pain.  2. Mild anemia.  3. Weakness.   DISCHARGE MEDICATIONS:  1. Acetaminophen 500 mg q.6 h p.r.n. for pain.  2. Calcium 1200 mg once daily.  3. Guaifenesin 1 tablet q.6 h p.r.n.  4. Multivitamin once daily.  5. Prednisone taper 60, 50, 40, 30, 20, 10.   HISTORY OF PRESENT ILLNESS:  Ms. Sistare is a 77 year old who had right  thigh pain which acutely occurred and limit her mobility.  She came into  the emergency department given that she lives by herself and could not  adequately take care of herself given her immobility.   PHYSICAL EXAMINATION:  VITAL SIGNS:  Temperature is 97.2, blood pressure  is 160/78, pulse 64, respirations 20, sating 95% on room air.  GENERAL:  This elderly female sitting on side of bed in no acute  distress.  HEENT: Unremarkable.  LUNGS: Clear to auscultation bilaterally.  HEART:  Regular rate and rhythm.  No murmurs, gallops, or rubs.  ABDOMEN:  Soft, protuberant, positive bowel sounds.  EXTREMITIES: The patient has very minimal lower extremity edema, has 5/5  strength in her upper extremities, and 4+/5 in lower extremities where  much improved from admission as far as her right leg strength, did not  have any palpable pain at this time in her right thigh area as before.   LABORATORY DATA:  My initial CBC showed white count 7.7, hemoglobin  11.4, platelet count 167.  The day before discharge showed white count  9.8, hemoglobin 11.3, platelet count 168, BMET shows sodium 139,  potassium 4.1, chloride 105, CO2 28, glucose 118, BUN 17, creatinine  0.74,  calcium of 8.6.  UA was negative with trace amount of blood, did  have few bacteria as well.  Sed rate was elevated at 58.    X-ray of hip showed degenerative changes in his bilateral without any  evidence of acute fracture, dislocation, does have osteopenia.   HOSPITAL COURSE:  Right thigh pain is unclear exact cause of this, does  have some degenerative changes in her hip, but seen more muscular  related.  Muscle strain given the acuity of this unclear, did not have  any palpable pain at time of discharge, but did have elevated sed rate  along with inflammation that could just be like something similar to  polymyalgia rheumatica but did respond after a day of prednisone and we  will continue on prednisone taper for this.  The patient was able to  ambulate with minimal assistance as back to her baseline, but would  encourage her to have further physical therapy at home and will be set  up at  time of discharge.  She will continue on prednisone taper and  follow with me in approximately 1-2 weeks unless something changes.  We  will need to recheck sed rate once the patient returns.      Catalina Pizza, M.D.  Electronically Signed     ZH/MEDQ  D:  05/30/2008  T:  05/31/2008  Job:  [id number]

## 2010-09-06 NOTE — Op Note (Signed)
[facility]  Patient:    Hannah Arias, Hannah Arias Visit Number: [id number] MRN: [id number]          Service Type: DSU Location: 5000 [id number] [id number] Attending Physician:  Jacki Cones Dictated by:   Veverly Fells Ophelia Charter, M.D. Proc. Date: 01/22/01 Admit Date:  01/22/2001                             Operative Report  PREOPERATIVE DIAGNOSES:  Left shoulder labral tear, long head of the biceps tendon tear, and complete rotator cuff tear.  PROCEDURES:  Left shoulder arthroscopy, labral and biceps tendon debridement, open acromioplasty and rotator cuff repair.  SURGEON:  Mark C. Ophelia Charter, M.D.  ASSISTANT:  Zonia Kief, P.A.-C.  ANESTHESIA:  General plus Marcaine skin local.  ESTIMATED BLOOD LOSS:  Minimal.  DESCRIPTION OF PROCEDURE:  After satisfactory general anesthesia with the patient in beach chair position, appropriate padding and positioning, preoperative vancomycin was given.  The shoulder was prepped and the usual impervious stockinette, split sheets and drapes, and arthroscopic pouch were applied.  Arthroscope was introduced from a posterior portal.  Anterior portal was made with a long rod using an in-and-out technique anteriorly.  The long head of the biceps tendon was torn and a small stump remaining, which was debrided with the Cuda shaver.  There was a complex tear of the superior labrum at the biceps attachment with a flap tear, and this was trimmed away with the Cuda shaver.  There was grade 4 chondromalacia on both sides of the joint.  The areas were smoothed.  Superior aspect of the head showed severe pitting and corrosion, and looking superiorly, the tear in the rotator cuff and the large acromial spur were visualized.  The arthroscope was removed, joint suctioned dry, sterile towels used to dry the skin and arthroscopic fluid, and a Betadine Vi-Drape was applied.  Incision was made with the anterior aspect of the acromion, splitting the deltoid  a distance of 1.5 cm. The deltoid was peeled off the anterior aspect of the acromion.  The spur was present, and a 3/4 inch straight osteotome was used to remove the anterior inferior portion of the acromion, removing the spur.  There was some lateral sloughing of the acromion, and this was trimmed back as well.  Undersurface was rasped smooth.  There was an obvious large complete tear of the rotator cuff present.  Bursectomy was performed with pickups and Metzenbaums.  An Lavena Bullion was used to grasp the rotator cuff and for 10 minutes, mobilization of the cuff was performed.  Finger dissection was used, and using a half-inch osteotome, the bone was repaired to the greater tuberosity to a good bleeding surface.  Two Ultrafix anchors were placed and then large bites of 1 cm cuff were taken for repair.  The tear primarily involved the supraspinatus portion and subscapularis, and the posterior portion of the cuff showed some mild degenerative changes but was intact.  With the repair performed, the inferior aspect of the acromion was rasped a little bit more and trimmed away with the [facility] rongeur.  A little additional bone was taken due to the thickness of the acromion.  Distal aspect of the clavicle was palpated and was not prominent. The deltoid was repaired back with #2 Tycron sutures through holes in the acromion.  Vicryl 2-0 in the subcutaneous tissue, staples in the skin as well as arthroscopic portals.  Xeroform, 4 x 4s,  Marcaine in the subacromial space, and a shoulder immobilizer was applied.  Instrument count and needle count were correct. Dictated by:   Veverly Fells Ophelia Charter, M.D. Attending Physician:  Jacki Cones DD:  01/22/01 TD:  01/22/01 Job: [id number]

## 2010-09-06 NOTE — H&P (Signed)
[name], Arias NO.:  [id number]   MEDICAL RECORD NO.:  [id number]          PATIENT TYPE:  INP   LOCATION:  5014                         FACILITY:  [facility]   PHYSICIAN:  Burnard Bunting, M.D.    DATE OF BIRTH:  02-Dec-1910   DATE OF ADMISSION:  08/25/2004  DATE OF DISCHARGE:                                HISTORY & PHYSICAL   CHIEF COMPLAINT:  Right hip pain.   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  She reports right hip pain as well as right rib pain.  She fell on her way  walking to church.  She denies any loss of consciousness.  She denies any  other orthopedic complaints.   PAST MEDICAL AND SURGICAL HISTORY:  1.  History of prior pelvic fracture on the left.  2.  Lumpectomy for breast cancer.  3.  Osteoporosis.  4.  Hiatal hernia.   ALLERGIES:  1.  PENICILLIN..  2.  CODEINE.   MEDICATIONS ON ADMISSION:  1.  Tamoxifen.  2.  Calcium carbonate.  3.  Vitamin D.   EXAMINATION:  VITAL SIGNS:  Temperature 98.4; pulse 84; blood pressure  152/75; respirations 20.  GENERAL:  She is in no distress.  She has normal body mass index.  CHEST:  Clear to auscultation.  She has mild tenderness on the right side of  her rib cage.  HEART:  Regular rhythm.  ABDOMEN:  Benign.  EXTREMITIES:  Dorsalis pedis pulses 2+/4 bilaterally.  She has mild right  hip pain with range of motion.  There is no proximal lymphadenopathy.  Reflexes are normal.  She has 5/5 dorsiflex, plantar flex, quad and  hamstring strength.   Her hemoglobin is 12.7, platelets 158.  INR 1.1, PTT 33.  Sodium 135,  potassium 4, chloride 107, CO2 21, glucose 109, BUN 13, creatinine 0.9,  calcium 8.3.  Urinalysis is negative.  X-rays interpreted as showing a right  subcapital femoral neck fracture.  CT scan was obtained which did not  confirm that fracture.  Does suggest an anterior acetabular  fracture which  is nondisplaced near the junction of the ramus.  MRI scan shows no hip  fracture but a small anterior acetabular fracture.   IMPRESSION:  No hip fracture.  Anterior acetabular fracture.   PLAN:  At this time, the patient is going to be admitted and will mobilize  with physical therapy.  Once she is safe with physical therapy, she should  be able to be discharged home.  All in all, she does not have an operative  hip  fracture, but she will require physical therapy to make sure she is safe to  ambulate.  Once she is safe to ambulate, she can be discharged home.  All in  all, in the emergency room approximately two hours' time was spent figuring  out about the status of her hip.      GSD/MEDQ  D:  08/26/2004  T:  08/26/2004  Job:  [id number]

## 2010-09-06 NOTE — Op Note (Signed)
Hannah Arias, Hannah Arias                           ACCOUNT NO.:  [id number]   MEDICAL RECORD NO.:  [id number]                   PATIENT TYPE:  AMB   LOCATION:  DSC                                  FACILITY:  [facility]   PHYSICIAN:  Rose Phi. Maple Hudson, M.D.                DATE OF BIRTH:  1910-10-25   DATE OF PROCEDURE:  11/30/2003  DATE OF DISCHARGE:                                 OPERATIVE REPORT   PREOPERATIVE DIAGNOSIS:  Small cancer of the left breast.   POSTOPERATIVE DIAGNOSIS:  Small cancer of the left breast.   PROCEDURE:  Left partial mastectomy with needle localization and specimen  mammogram.   SURGEON:  Rose Phi. Maple Hudson, M.D.   ANESTHESIA:  General.   DESCRIPTION OF PROCEDURE:  The patient was placed on the operating table  with the arm extended on the arm board.  The left breast was prepped and  draped in the usual fashion.  The known lesion was at the 11:30-12 o'clock  position, and a preoperative localizing wire had been placed.  A curved  incision using that wire as a guide was then outlined with the marking  pencil and then the area thoroughly infiltrated with a local anesthetic  mixture.  Incision was made and with sharp dissection the wire and  surrounding tissue were excised.  Hemostasis was obtained with the cautery.  Specimen mammogram reported removal of the lesion.  Specimen was oriented  for the pathologist.  A single layer closure of interrupted 4-0 Monocryl and  Steri-Strips was carried out.  Dressing applied.  The patient was  transferred to the recovery room in satisfactory condition having tolerated  the procedure well.                                               Rose Phi. Maple Hudson, M.D.    PRY/MEDQ  D:  11/30/2003  T:  11/30/2003  Job:  [id number]

## 2010-09-11 ENCOUNTER — Telehealth: Payer: Self-pay | Admitting: Orthopedic Surgery

## 2010-09-11 NOTE — Telephone Encounter (Signed)
No new note. 

## 2010-10-25 ENCOUNTER — Emergency Department (HOSPITAL_COMMUNITY)
Admission: EM | Admit: 2010-10-25 | Discharge: 2010-10-26 | Disposition: A | Discharge status: Home / Self Care | Payer: Medicare HMO | Attending: Emergency Medicine | Admitting: Emergency Medicine

## 2010-10-25 DIAGNOSIS — I10 Essential (primary) hypertension: Secondary | ICD-10-CM | POA: Insufficient documentation

## 2011-02-28 NOTE — Telephone Encounter (Signed)
Hannah Arias is scheduled for 03/04/11 to discuss her surgery

## 2011-03-04 ENCOUNTER — Ambulatory Visit (INDEPENDENT_AMBULATORY_CARE_PROVIDER_SITE_OTHER): Payer: Medicare HMO | Admitting: Orthopedic Surgery

## 2011-03-04 ENCOUNTER — Encounter: Payer: Self-pay | Admitting: Orthopedic Surgery

## 2011-03-04 VITALS — BP 142/70 | Ht 62.0 in | Wt 172.0 lb

## 2011-03-04 DIAGNOSIS — G5600 Carpal tunnel syndrome, unspecified upper limb: Secondary | ICD-10-CM

## 2011-03-04 DIAGNOSIS — S52023A Displaced fracture of olecranon process without intraarticular extension of unspecified ulna, initial encounter for closed fracture: Secondary | ICD-10-CM

## 2011-03-04 HISTORY — DX: Carpal tunnel syndrome, unspecified upper limb: G56.00

## 2011-03-04 NOTE — Progress Notes (Signed)
77 year old female, who had open treatment internal fixation, RIGHT elbow several months ago did well. Presents now with signs and symptoms of carpal tunnel syndrome. She's had a previous test, which indicated   positive for carpal tunnel syndrome as well as axonal myelin disease. Also underlying mixed sensory demyelinating polyneuropathy.  She is also considering surgery, but based on the demyelinating portion of this neuropathy. It is unclear whether surgery will help her. I think we have a 70% chance of improvement. At this time. This is unsatisfactory. For her and she would like to put off surgery.  System review pain elbow, occasional  Examination RIGHT elbow and RIGHT upper extremity  She is ambulatory with a cane, normal range of motion is noted in the elbow except for a 15 loss of extension. Strength is normal. Incision healed. No swelling around the elbow joint. Elbow joint is stable. She has a normal pulse in the radial and ulnar artery. There is decreased sensation in the median nerve distribution. Tinel's sign is negative. Compression test at the ulnar nerve is negative. Compression test at the wrist is positive.  Radiograph is taken of her RIGHT elbow/pain   The elbow plate from Acumed is in good position. There are no signs of loosening of the hardware and the fracture of the olecranon healed well.  Impression stable fixation, RIGHT elbow.

## 2011-03-05 ENCOUNTER — Telehealth: Payer: Self-pay | Admitting: Orthopedic Surgery

## 2011-03-05 NOTE — Telephone Encounter (Signed)
Hannah Arias called this morning and said she wants more information about the nerve disorder you mentioned to her at last appointment. Asked if there is another doctor you can refer her to for this problem, and what kind of doctor it would be. Her # [phone]

## 2011-03-05 NOTE — Telephone Encounter (Signed)
Hannah Arias said either doctor will be ok as long as her insurance is accepted

## 2011-03-05 NOTE — Telephone Encounter (Signed)
Any orthopaedic surgeon or hand specialist of her choosing   sypher  kuzma  grammig

## 2011-03-06 ENCOUNTER — Encounter: Payer: Self-pay | Admitting: Orthopedic Surgery

## 2011-03-07 NOTE — Telephone Encounter (Signed)
03/06/11 Our office has been checking on participating orthopaedic hand surgeons and none are in [facility] network, however, Dr. Stark Jock office can see patient, and she would be responsible for any non-network copays. Patient has just called back to relay that she is getting new insurance and believes it will be in effect Dec. 1, 2012.  Hold on referrals until new coverage, new card received.

## 2011-03-10 ENCOUNTER — Encounter: Payer: Self-pay | Admitting: Orthopedic Surgery

## 2011-03-17 ENCOUNTER — Encounter: Payer: Self-pay | Admitting: Orthopedic Surgery

## 2012-12-10 ENCOUNTER — Emergency Department (HOSPITAL_COMMUNITY)
Admission: EM | Admit: 2012-12-10 | Discharge: 2012-12-10 | Disposition: A | Discharge status: Home / Self Care | Payer: Medicare Other | Attending: Emergency Medicine | Admitting: Emergency Medicine

## 2012-12-10 ENCOUNTER — Encounter (HOSPITAL_COMMUNITY): Payer: Self-pay

## 2012-12-10 DIAGNOSIS — Z23 Encounter for immunization: Secondary | ICD-10-CM | POA: Insufficient documentation

## 2012-12-10 DIAGNOSIS — W102XXA Fall (on)(from) incline, initial encounter: Secondary | ICD-10-CM

## 2012-12-10 DIAGNOSIS — Z880 Allergy status to penicillin: Secondary | ICD-10-CM | POA: Insufficient documentation

## 2012-12-10 DIAGNOSIS — Y93H2 Activity, gardening and landscaping: Secondary | ICD-10-CM | POA: Insufficient documentation

## 2012-12-10 DIAGNOSIS — Y9289 Other specified places as the place of occurrence of the external cause: Secondary | ICD-10-CM | POA: Insufficient documentation

## 2012-12-10 DIAGNOSIS — Z9889 Other specified postprocedural states: Secondary | ICD-10-CM | POA: Insufficient documentation

## 2012-12-10 DIAGNOSIS — W1809XA Striking against other object with subsequent fall, initial encounter: Secondary | ICD-10-CM | POA: Insufficient documentation

## 2012-12-10 DIAGNOSIS — Z79899 Other long term (current) drug therapy: Secondary | ICD-10-CM | POA: Insufficient documentation

## 2012-12-10 DIAGNOSIS — K219 Gastro-esophageal reflux disease without esophagitis: Secondary | ICD-10-CM | POA: Insufficient documentation

## 2012-12-10 DIAGNOSIS — S51809A Unspecified open wound of unspecified forearm, initial encounter: Secondary | ICD-10-CM | POA: Insufficient documentation

## 2012-12-10 MED ORDER — TETANUS-DIPHTH-ACELL PERTUSSIS 5-2.5-18.5 LF-MCG/0.5 IM SUSP
0.5000 mL | Freq: Once | INTRAMUSCULAR | Status: AC
  Administered 2012-12-10: 0.5 mL via INTRAMUSCULAR
  Filled 2012-12-10: qty 0.5

## 2012-12-10 NOTE — ED Notes (Signed)
Pt states she was watering her flowers and fell over. States she is always falling around. Denies other symptoms

## 2012-12-10 NOTE — ED Provider Notes (Signed)
CSN: [id number]     Arrival date & time 12/10/12  1626 History     First MD Initiated Contact with Patient 12/10/12 1717     Chief Complaint  Patient presents with  . Fall   (Consider location/radiation/quality/duration/timing/severity/associated sxs/prior Treatment) Patient is a 79 y.o. female presenting with fall. The history is provided by the patient (the pt states she fell on her patio,  she states she cut her right arm.  no loc).  Fall This is a new problem. The current episode started 6 to 12 hours ago. The problem occurs rarely. The problem has been resolved. Pertinent negatives include no chest pain, no abdominal pain and no headaches. Nothing aggravates the symptoms. Nothing relieves the symptoms. The treatment provided mild relief.    Past Medical History  Diagnosis Date  . GERD (gastroesophageal reflux disease)    Past Surgical History  Procedure Laterality Date  . Tonsillectomy and adenoidectomy      11 yr  . Appendectomy      79 yr old  . Tubal removal    . Hemorroidectomy    . Hernia repair    . Shoulder surgery      left rotator cuff  . Cataract extraction    . Elbow surgery    . Breast lumpectomy     Family History  Problem Relation Age of Onset  . Cancer    . Diabetes     History  Substance Use Topics  . Smoking status: Never Smoker   . Smokeless tobacco: Not on file  . Alcohol Use: Yes   OB History   Grav Para Term Preterm Abortions TAB SAB Ect Mult Living                 Review of Systems  Constitutional: Negative for appetite change and fatigue.  HENT: Negative for congestion, sinus pressure and ear discharge.   Eyes: Negative for discharge.  Respiratory: Negative for cough.   Cardiovascular: Negative for chest pain.  Gastrointestinal: Negative for abdominal pain and diarrhea.  Genitourinary: Negative for frequency and hematuria.  Musculoskeletal: Negative for back pain.  Skin: Negative for rash.       Skin tare right arm   Neurological: Negative for seizures and headaches.  Psychiatric/Behavioral: Negative for hallucinations.    Allergies  Aspirin; Codeine; and Penicillins  Home Medications   Current Outpatient Rx  Name  Route  Sig  Dispense  Refill  . calcium carbonate (OS-CAL - DOSED IN MG OF ELEMENTAL CALCIUM) 1250 MG tablet   Oral   Take 1 tablet by mouth.         . Multiple Vitamin (MULTIVITAMIN WITH MINERALS) TABS tablet   Oral   Take 1 tablet by mouth daily.         Marland Kitchen omeprazole (PRILOSEC) 20 MG capsule   Oral   Take 20 mg by mouth daily.           BP 124/106  Pulse 78  Temp(Src) 98.6 F (37 C) (Oral)  Resp 18  Ht 5\' 3"  (1.6 m)  Wt 160 lb (72.576 kg)  BMI 28.35 kg/m2  SpO2 94% Physical Exam  Constitutional: She is oriented to person, place, and time. She appears well-developed.  HENT:  Head: Normocephalic.  Eyes: Conjunctivae and EOM are normal. No scleral icterus.  Neck: Neck supple. No thyromegaly present.  Cardiovascular: Normal rate and regular rhythm.  Exam reveals no gallop and no friction rub.   No murmur heard. Pulmonary/Chest: No stridor.  She has no wheezes. She has no rales. She exhibits no tenderness.  Abdominal: She exhibits no distension. There is no tenderness. There is no rebound.  Musculoskeletal: Normal range of motion. She exhibits no edema.  Small skin tare right arm  Lymphadenopathy:    She has no cervical adenopathy.  Neurological: She is oriented to person, place, and time. Coordination normal.  Skin: No rash noted. No erythema.  Psychiatric: She has a normal mood and affect. Her behavior is normal.    ED Course   Procedures (including critical care time)  Labs Reviewed - No data to display No results found. 1. Fall (on)(from) incline, initial encounter     MDM    Benny Lennert, MD 12/10/12 [phone]

## 2013-02-15 ENCOUNTER — Ambulatory Visit: Payer: Self-pay

## 2013-02-22 ENCOUNTER — Ambulatory Visit (INDEPENDENT_AMBULATORY_CARE_PROVIDER_SITE_OTHER): Payer: Medicare Other

## 2013-02-22 VITALS — BP 161/69 | HR 70 | Resp 12

## 2013-02-22 DIAGNOSIS — B351 Tinea unguium: Secondary | ICD-10-CM

## 2013-02-22 DIAGNOSIS — M2010 Hallux valgus (acquired), unspecified foot: Secondary | ICD-10-CM

## 2013-02-22 DIAGNOSIS — M79609 Pain in unspecified limb: Secondary | ICD-10-CM

## 2013-02-22 DIAGNOSIS — M2040 Other hammer toe(s) (acquired), unspecified foot: Secondary | ICD-10-CM

## 2013-02-22 DIAGNOSIS — M1990 Unspecified osteoarthritis, unspecified site: Secondary | ICD-10-CM

## 2013-02-22 NOTE — Progress Notes (Signed)
  Subjective:    Patient ID: Hannah Arias, female    DOB: May 16, 1910, 79 y.o.   MRN: [id number]  HPI Comments: '' TOENAILS TRIM''  no changes in medication her health care at this time    Review of Systems  HENT: Positive for hearing loss.   Eyes: Positive for visual disturbance.  Respiratory: Positive for shortness of breath and wheezing.   Cardiovascular: Negative.   Gastrointestinal: Negative.   Endocrine: Negative.   Genitourinary: Positive for frequency.  Musculoskeletal: Negative.   Skin: Negative.   Allergic/Immunologic: Negative.   Neurological: Positive for weakness.  Hematological: Negative.   Psychiatric/Behavioral: Negative.        Objective:   Physical Exam Vascular status intact dorsalis pedis pulse one over 4 bilateral. PT pulse plus one over 4 bilateral. Refill time 3 seconds all digits. Epicritic and proprioceptive sensations intact and symmetric bilateral. Patient is significant digital contractures hammertoe deformity HAV deformity as well as Ostrow arthropathy the feet bilateral. Nails thick brittle discolored incurvated ingrowing painful and tender with ambulation and on attempted debridement. Painful nails 1 through 4 bilateral. No secondary infection at this time to       Assessment & Plan:  Onychomycosis with dystrophy discoloration and painful ingrowing nails 1 through 4 bilateral. Painful mycotic nails debridement at this time. Recheck in 3 months for continued palliative care and as-needed basis. Maintain a coming shoe gear as instructed. Contact us with any changes or exacerbations in the interim.  Alvan Dame DPM

## 2013-02-22 NOTE — Patient Instructions (Signed)

## 2013-02-25 ENCOUNTER — Ambulatory Visit: Payer: Self-pay

## 2013-05-11 ENCOUNTER — Inpatient Hospital Stay (HOSPITAL_COMMUNITY)
Admission: EM | Admit: 2013-05-11 | Discharge: 2013-05-15 | DRG: 291 | Disposition: A | Discharge status: Home / Self Care | Payer: Medicare Other | Attending: Internal Medicine | Admitting: Internal Medicine

## 2013-05-11 ENCOUNTER — Emergency Department (HOSPITAL_COMMUNITY): Payer: Medicare Other

## 2013-05-11 ENCOUNTER — Encounter (HOSPITAL_COMMUNITY): Payer: Self-pay | Admitting: Emergency Medicine

## 2013-05-11 DIAGNOSIS — I509 Heart failure, unspecified: Secondary | ICD-10-CM | POA: Diagnosis present

## 2013-05-11 DIAGNOSIS — J9600 Acute respiratory failure, unspecified whether with hypoxia or hypercapnia: Secondary | ICD-10-CM | POA: Diagnosis present

## 2013-05-11 DIAGNOSIS — J111 Influenza due to unidentified influenza virus with other respiratory manifestations: Secondary | ICD-10-CM | POA: Diagnosis present

## 2013-05-11 DIAGNOSIS — R0902 Hypoxemia: Secondary | ICD-10-CM | POA: Diagnosis present

## 2013-05-11 DIAGNOSIS — J9801 Acute bronchospasm: Secondary | ICD-10-CM

## 2013-05-11 DIAGNOSIS — I10 Essential (primary) hypertension: Secondary | ICD-10-CM

## 2013-05-11 DIAGNOSIS — J441 Chronic obstructive pulmonary disease with (acute) exacerbation: Secondary | ICD-10-CM | POA: Diagnosis present

## 2013-05-11 DIAGNOSIS — Z833 Family history of diabetes mellitus: Secondary | ICD-10-CM

## 2013-05-11 DIAGNOSIS — I5031 Acute diastolic (congestive) heart failure: Principal | ICD-10-CM | POA: Diagnosis present

## 2013-05-11 DIAGNOSIS — D649 Anemia, unspecified: Secondary | ICD-10-CM | POA: Diagnosis present

## 2013-05-11 DIAGNOSIS — K219 Gastro-esophageal reflux disease without esophagitis: Secondary | ICD-10-CM | POA: Diagnosis present

## 2013-05-11 LAB — URINE MICROSCOPIC-ADD ON

## 2013-05-11 LAB — CBC WITH DIFFERENTIAL/PLATELET
BASOS ABS: 0 10*3/uL (ref 0.0–0.1)
Basophils Relative: 1 % (ref 0–1)
Eosinophils Absolute: 0 10*3/uL (ref 0.0–0.7)
Eosinophils Relative: 0 % (ref 0–5)
HEMATOCRIT: 35.2 % — AB (ref 36.0–46.0)
HEMOGLOBIN: 11.8 g/dL — AB (ref 12.0–15.0)
LYMPHS PCT: 20 % (ref 12–46)
Lymphs Abs: 1.3 10*3/uL (ref 0.7–4.0)
MCH: 31.3 pg (ref 26.0–34.0)
MCHC: 33.5 g/dL (ref 30.0–36.0)
MCV: 93.4 fL (ref 78.0–100.0)
MONO ABS: 0.6 10*3/uL (ref 0.1–1.0)
Monocytes Relative: 9 % (ref 3–12)
NEUTROS PCT: 71 % (ref 43–77)
Neutro Abs: 4.6 10*3/uL (ref 1.7–7.7)
Platelets: 125 10*3/uL — ABNORMAL LOW (ref 150–400)
RBC: 3.77 MIL/uL — ABNORMAL LOW (ref 3.87–5.11)
RDW: 13.3 % (ref 11.5–15.5)
WBC: 6.5 10*3/uL (ref 4.0–10.5)

## 2013-05-11 LAB — TROPONIN I: Troponin I: <0.3 ng/mL (ref <0.30)

## 2013-05-11 LAB — URINALYSIS, ROUTINE W REFLEX MICROSCOPIC
Bilirubin Urine: NEGATIVE
GLUCOSE, UA: NEGATIVE mg/dL
HGB URINE DIPSTICK: NEGATIVE
KETONES UR: NEGATIVE mg/dL
NITRITE: NEGATIVE
PH: 5.5 (ref 5.0–8.0)
SPECIFIC GRAVITY, URINE: 1.025 (ref 1.005–1.030)
Urobilinogen, UA: 0.2 mg/dL (ref 0.0–1.0)

## 2013-05-11 LAB — COMPREHENSIVE METABOLIC PANEL
ALBUMIN: 3.5 g/dL (ref 3.5–5.2)
ALT: 10 U/L (ref 0–35)
AST: 22 U/L (ref 0–37)
Alkaline Phosphatase: 63 U/L (ref 39–117)
BILIRUBIN TOTAL: 0.6 mg/dL (ref 0.3–1.2)
BUN: 16 mg/dL (ref 6–23)
CHLORIDE: 99 meq/L (ref 96–112)
CO2: 25 meq/L (ref 19–32)
CREATININE: 0.75 mg/dL (ref 0.50–1.10)
Calcium: 8.8 mg/dL (ref 8.4–10.5)
GFR calc Af Amer: 78 mL/min — ABNORMAL LOW (ref >90)
GFR calc non Af Amer: 67 mL/min — ABNORMAL LOW (ref >90)
Glucose, Bld: 157 mg/dL — ABNORMAL HIGH (ref 70–99)
POTASSIUM: 3.5 meq/L — AB (ref 3.7–5.3)
Sodium: 137 mEq/L (ref 137–147)
Total Protein: 6.8 g/dL (ref 6.0–8.3)

## 2013-05-11 LAB — CG4 I-STAT (LACTIC ACID): Lactic Acid, Venous: 2.42 mmol/L — ABNORMAL HIGH (ref 0.5–2.2)

## 2013-05-11 LAB — PRO B NATRIURETIC PEPTIDE: Pro B Natriuretic peptide (BNP): 1875 pg/mL — ABNORMAL HIGH (ref 0–450)

## 2013-05-11 MED ORDER — FUROSEMIDE 10 MG/ML IJ SOLN
60.0000 mg | Freq: Once | INTRAMUSCULAR | Status: AC
  Administered 2013-05-11: 60 mg via INTRAVENOUS
  Filled 2013-05-11: qty 6

## 2013-05-11 MED ORDER — DM-GUAIFENESIN ER 30-600 MG PO TB12
1.0000 | ORAL_TABLET | Freq: Two times a day (BID) | ORAL | Status: DC
  Administered 2013-05-11 – 2013-05-15 (×8): 1 via ORAL
  Filled 2013-05-11 (×8): qty 1

## 2013-05-11 MED ORDER — PANTOPRAZOLE SODIUM 40 MG PO TBEC
40.0000 mg | DELAYED_RELEASE_TABLET | Freq: Every day | ORAL | Status: DC
  Administered 2013-05-12 – 2013-05-15 (×4): 40 mg via ORAL
  Filled 2013-05-11 (×4): qty 1

## 2013-05-11 MED ORDER — LEVOFLOXACIN 500 MG PO TABS
500.0000 mg | ORAL_TABLET | Freq: Every day | ORAL | Status: DC
  Administered 2013-05-12: 500 mg via ORAL
  Filled 2013-05-11: qty 1

## 2013-05-11 MED ORDER — FUROSEMIDE 10 MG/ML IJ SOLN
40.0000 mg | Freq: Every day | INTRAMUSCULAR | Status: DC
  Administered 2013-05-12 – 2013-05-13 (×2): 40 mg via INTRAVENOUS
  Filled 2013-05-11 (×2): qty 4

## 2013-05-11 MED ORDER — BENZONATATE 100 MG PO CAPS: 100.0000 mg | ORAL_CAPSULE | Freq: Two times a day (BID) | ORAL | Status: DC | PRN

## 2013-05-11 MED ORDER — POTASSIUM CHLORIDE CRYS ER 20 MEQ PO TBCR
40.0000 meq | EXTENDED_RELEASE_TABLET | Freq: Two times a day (BID) | ORAL | Status: AC
  Administered 2013-05-11 – 2013-05-12 (×2): 40 meq via ORAL
  Filled 2013-05-11 (×2): qty 2

## 2013-05-11 MED ORDER — LEVOFLOXACIN IN D5W 500 MG/100ML IV SOLN
500.0000 mg | Freq: Once | INTRAVENOUS | Status: AC
  Administered 2013-05-11: 500 mg via INTRAVENOUS
  Filled 2013-05-11: qty 100

## 2013-05-11 MED ORDER — ALBUTEROL SULFATE (2.5 MG/3ML) 0.083% IN NEBU
5.0000 mg | INHALATION_SOLUTION | Freq: Once | RESPIRATORY_TRACT | Status: AC
  Administered 2013-05-11: 5 mg via RESPIRATORY_TRACT
  Filled 2013-05-11: qty 6

## 2013-05-11 MED ORDER — ENOXAPARIN SODIUM 40 MG/0.4ML ~~LOC~~ SOLN
40.0000 mg | SUBCUTANEOUS | Status: DC
  Administered 2013-05-11 – 2013-05-12 (×2): 40 mg via SUBCUTANEOUS
  Filled 2013-05-11 (×2): qty 0.4

## 2013-05-11 MED ORDER — IPRATROPIUM BROMIDE 0.02 % IN SOLN
0.5000 mg | RESPIRATORY_TRACT | Status: DC | PRN
  Administered 2013-05-11 – 2013-05-13 (×2): 0.5 mg via RESPIRATORY_TRACT
  Filled 2013-05-11 (×2): qty 2.5

## 2013-05-11 MED ORDER — NITROGLYCERIN 2 % TD OINT
1.0000 [in_us] | TOPICAL_OINTMENT | Freq: Once | TRANSDERMAL | Status: AC
  Administered 2013-05-11: 1 [in_us] via TOPICAL
  Filled 2013-05-11: qty 1

## 2013-05-11 MED ORDER — ALBUTEROL SULFATE (2.5 MG/3ML) 0.083% IN NEBU
2.5000 mg | INHALATION_SOLUTION | RESPIRATORY_TRACT | Status: DC | PRN
  Administered 2013-05-11 – 2013-05-15 (×4): 2.5 mg via RESPIRATORY_TRACT
  Filled 2013-05-11 (×2): qty 3

## 2013-05-11 MED ORDER — IPRATROPIUM BROMIDE 0.02 % IN SOLN
0.5000 mg | Freq: Once | RESPIRATORY_TRACT | Status: AC
  Administered 2013-05-11: 0.5 mg via RESPIRATORY_TRACT
  Filled 2013-05-11: qty 2.5

## 2013-05-11 MED ORDER — SODIUM CHLORIDE 0.9 % IJ SOLN
3.0000 mL | Freq: Two times a day (BID) | INTRAMUSCULAR | Status: DC
  Administered 2013-05-11 – 2013-05-15 (×7): 3 mL via INTRAVENOUS

## 2013-05-11 NOTE — ED Notes (Signed)
Family at bedside. Patient eating at this time. Asked family to let staff know when she was finished so she can be carried to her room upstairs.

## 2013-05-11 NOTE — ED Notes (Signed)
To ED from home via EMS for 2d of cough, SOB, sats 87% on EMS arrival, gave 125 mg Solumedrol and 3 Alb nebs enroute, 20g LAC, denies pain on arrival, is A/O X4

## 2013-05-11 NOTE — ED Provider Notes (Signed)
CSN: [id number]     Arrival date & time 05/11/13  1429 History  This chart was scribed for Janice Norrie, MD by Elby Beck, ED Scribe. This patient was seen in room APA16A/APA16A and the patient's care was started at 3:20 PM.   Chief Complaint  Patient presents with  . Shortness of Breath    The history is provided by the patient and the EMS personnel. No language interpreter was used.    HPI Comments: Hannah Arias is a 80 y.o. Female brought by EMS and accompanied by relatives to the Emergency Department complaining of a gradually worsening cough starting yesterday. Relatives report associated congestion, voice hoarseness and wheezing. Pt lives alone, she became short of breath today, and pressed a panic button to call EMS to her home when she couldn't reach her relatives by phone. Relative states that pt's O2 stats upon EMS arrival were 86%, and that pt looked "bluish" upon EMS arrival. She was given 125 mg Solumedrol and 3 Albulterol nebulizer treatments by EMS, which she states have offered some relief. She denies SOB currently, however she still looks SOB. Relatives also reports that pt has felt warm to the touch today, but has not been diaphoretic. Pt also states that she had a mild frontal headache yesterday, which has subsided today. She further states that she has had mild rhinorrhea and sneezing, but that these are usual symptoms for her from allergies. Pt believes that she may have the flu. Pt denies any known sick contacts. She denies sore throat, nausea, emesis, abdominal pain, diarrhea, chest pain, swelling in legs, fever or any other symptoms. Pt denies use of tobacco or alcohol use. Pt states that she did receive the flu vaccine this season.   PCP- Dr. Delphina Cahill   Past Medical History  Diagnosis Date  . GERD (gastroesophageal reflux disease)    Past Surgical History  Procedure Laterality Date  . Tonsillectomy and adenoidectomy      11 yr  . Appendectomy      80 yr old  .  Tubal removal    . Hemorroidectomy    . Hernia repair    . Shoulder surgery      left rotator cuff  . Cataract extraction    . Elbow surgery    . Breast lumpectomy     Family History  Problem Relation Age of Onset  . Cancer    . Diabetes     History  Substance Use Topics  . Smoking status: Never Smoker   . Smokeless tobacco: Not on file  . Alcohol Use: Yes  Pt lives alone Denies alcohol use  OB History   Grav Para Term Preterm Abortions TAB SAB Ect Mult Living                 Review of Systems  Constitutional: Negative for diaphoresis.  HENT: Positive for congestion, rhinorrhea, sneezing and voice change ("hoarseness"). Negative for sore throat.   Respiratory: Positive for cough, shortness of breath (subsided) and wheezing.   Cardiovascular: Negative for chest pain.  Gastrointestinal: Negative for nausea, vomiting, abdominal pain and diarrhea.  Neurological: Positive for headaches (subsided).  All other systems reviewed and are negative.   Allergies  Aspirin; Codeine; and Penicillins  Home Medications   Current Outpatient Rx  Name  Route  Sig  Dispense  Refill  . benzonatate (TESSALON) 100 MG capsule   Oral   Take 100 mg by mouth 2 (two) times daily as needed for cough.         Marland Kitchen  calcium carbonate (OS-CAL - DOSED IN MG OF ELEMENTAL CALCIUM) 1250 MG tablet   Oral   Take 1 tablet by mouth.         . dextromethorphan-guaiFENesin (MUCINEX DM) 30-600 MG per 12 hr tablet   Oral   Take 1 tablet by mouth 2 (two) times daily.         . Multiple Vitamin (MULTIVITAMIN WITH MINERALS) TABS tablet   Oral   Take 1 tablet by mouth daily.         Marland Kitchen omeprazole (PRILOSEC) 20 MG capsule   Oral   Take 20 mg by mouth daily.           Triage Vitals: BP 112/58  Pulse 102  Temp(Src) 98.8 F (37.1 C) (Oral)  Resp 35  Ht 5\' 3"  (1.6 m)  Wt 160 lb (72.576 kg)  BMI 28.35 kg/m2  SpO2 96%  Vital signs normal  Except for tachycardia   Physical Exam  Nursing  note and vitals reviewed. Constitutional: She is oriented to person, place, and time. She appears well-developed and well-nourished.  Non-toxic appearance. She does not appear ill. No distress.  HENT:  Head: Normocephalic and atraumatic.  Right Ear: External ear normal.  Left Ear: External ear normal.  Nose: Nose normal. No mucosal edema or rhinorrhea.  Mouth/Throat: Oropharynx is clear and moist and mucous membranes are normal. No dental abscesses or uvula swelling.  Dry lips  Eyes: Conjunctivae and EOM are normal. Pupils are equal, round, and reactive to light.  Neck: Normal range of motion and full passive range of motion without pain. Neck supple.  Cardiovascular: Normal rate, regular rhythm and normal heart sounds.  Exam reveals no gallop and no friction rub.   No murmur heard. Pulmonary/Chest: Accessory muscle usage present. Tachypnea noted. She is in respiratory distress. She has wheezes. She has no rhonchi. She has no rales. She exhibits no tenderness and no crepitus.  Audible expiratory wheezing. Retractions. Tachypneic. Diffuse wheezing and rhonchi on lung exam.  Abdominal: Soft. Normal appearance and bowel sounds are normal. She exhibits no distension. There is no tenderness. There is no rebound and no guarding.  Musculoskeletal: Normal range of motion. She exhibits no edema and no tenderness.  No edema  Neurological: She is alert and oriented to person, place, and time. She has normal strength. No cranial nerve deficit.  Skin: Skin is warm, dry and intact. No rash noted. No erythema. No pallor.  Psychiatric: She has a normal mood and affect. Her speech is normal and behavior is normal. Her mood appears not anxious.    ED Course  Procedures (including critical care time)  Medications  levofloxacin (LEVAQUIN) IVPB 500 mg (not administered)  albuterol (PROVENTIL) (2.5 MG/3ML) 0.083% nebulizer solution 5 mg (5 mg Nebulization Given 05/11/13 1551)  ipratropium (ATROVENT) nebulizer  solution 0.5 mg (0.5 mg Nebulization Given 05/11/13 1551)  furosemide (LASIX) injection 60 mg (60 mg Intravenous Given 05/11/13 1721)  nitroGLYCERIN (NITROGLYN) 2 % ointment 1 inch (1 inch Topical Given 05/11/13 1727)    DIAGNOSTIC STUDIES: Oxygen Saturation is 96% on RA, normal by my interpretation.    COORDINATION OF CARE: 3:25 PM- Will order a breathing treatment in the ED. Discussed plan to obtain a CXR and diagnostic lab work. Pt advised of plan for treatment and pt agrees.  Patient RE had 3 nebulizer treatments by EMS and one dose of steroids.  5:06 PM- Recheck with pt and she states that she is feeling better after receiving a breathing  treatment. Upon re-exam, pt is still having expiratory wheezing, but this has improved some. She no longer has audible wheezing and appears less tachypneic. Her BNP and troponin are pending. She was started on lasix and NTB paste empirically for new onset of CHF.   Troponin is normal, BNP is elevated, will admit for CHF.   17:32 Dr Karleen Hampshire, admit to tele, team 2  Labs Review Results for orders placed during the hospital encounter of 05/11/13  CBC WITH DIFFERENTIAL      Result Value Range   WBC 6.5  4.0 - 10.5 K/uL   RBC 3.77 (*) 3.87 - 5.11 MIL/uL   Hemoglobin 11.8 (*) 12.0 - 15.0 g/dL   HCT 35.2 (*) 36.0 - 46.0 %   MCV 93.4  78.0 - 100.0 fL   MCH 31.3  26.0 - 34.0 pg   MCHC 33.5  30.0 - 36.0 g/dL   RDW 13.3  11.5 - 15.5 %   Platelets 125 (*) 150 - 400 K/uL   Neutrophils Relative % 71  43 - 77 %   Neutro Abs 4.6  1.7 - 7.7 K/uL   Lymphocytes Relative 20  12 - 46 %   Lymphs Abs 1.3  0.7 - 4.0 K/uL   Monocytes Relative 9  3 - 12 %   Monocytes Absolute 0.6  0.1 - 1.0 K/uL   Eosinophils Relative 0  0 - 5 %   Eosinophils Absolute 0.0  0.0 - 0.7 K/uL   Basophils Relative 1  0 - 1 %   Basophils Absolute 0.0  0.0 - 0.1 K/uL  COMPREHENSIVE METABOLIC PANEL      Result Value Range   Sodium 137  137 - 147 mEq/L   Potassium 3.5 (*) 3.7 - 5.3 mEq/L    Chloride 99  96 - 112 mEq/L   CO2 25  19 - 32 mEq/L   Glucose, Bld 157 (*) 70 - 99 mg/dL   BUN 16  6 - 23 mg/dL   Creatinine, Ser 0.75  0.50 - 1.10 mg/dL   Calcium 8.8  8.4 - 10.5 mg/dL   Total Protein 6.8  6.0 - 8.3 g/dL   Albumin 3.5  3.5 - 5.2 g/dL   AST 22  0 - 37 U/L   ALT 10  0 - 35 U/L   Alkaline Phosphatase 63  39 - 117 U/L   Total Bilirubin 0.6  0.3 - 1.2 mg/dL   GFR calc non Af Amer 67 (*) >90 mL/min   GFR calc Af Amer 78 (*) >90 mL/min  PRO B NATRIURETIC PEPTIDE      Result Value Range   Pro B Natriuretic peptide (BNP) 1875.0 (*) 0 - 450 pg/mL  TROPONIN I      Result Value Range   Troponin I <0.30  <0.30 ng/mL  URINALYSIS, ROUTINE W REFLEX MICROSCOPIC      Result Value Range   Color, Urine YELLOW  YELLOW   APPearance CLEAR  CLEAR   Specific Gravity, Urine 1.025  1.005 - 1.030   pH 5.5  5.0 - 8.0   Glucose, UA NEGATIVE  NEGATIVE mg/dL   Hgb urine dipstick NEGATIVE  NEGATIVE   Bilirubin Urine NEGATIVE  NEGATIVE   Ketones, ur NEGATIVE  NEGATIVE mg/dL   Protein, ur TRACE (*) NEGATIVE mg/dL   Urobilinogen, UA 0.2  0.0 - 1.0 mg/dL   Nitrite NEGATIVE  NEGATIVE   Leukocytes, UA TRACE (*) NEGATIVE  URINE MICROSCOPIC-ADD ON      Result  Value Range   Squamous Epithelial / LPF FEW (*) RARE   WBC, UA 0-2  <3 WBC/hpf   RBC / HPF 0-2  <3 RBC/hpf  CG4 I-STAT (LACTIC ACID)      Result Value Range   Lactic Acid, Venous 2.42 (*) 0.5 - 2.2 mmol/L   Laboratory interpretation all normal except mildly elevated lactic acid, stable mild anemia, mild hypokalemia and hyperglycemia   Imaging Review Dg Chest Portable 1 View  05/11/2013   CLINICAL DATA:  Cough and shortness of breath.  EXAM: PORTABLE CHEST - 1 VIEW  COMPARISON:  07/01/2012  FINDINGS: Chronic lung disease again noted. The lung volumes are low. There may be a component of mild interstitial edema. No pleural fluid is identified. There is stable mild cardiomegaly and moderate tortuosity of the thoracic aorta.  IMPRESSION:  Chronic lung disease, low lung volumes and possible component of mild interstitial edema.   Electronically Signed   By: Aletta Edouard M.D.   On: 05/11/2013 15:19    EKG Interpretation    Date/Time:  Wednesday May 11 2013 15:38:04 EST Ventricular Rate:  91 PR Interval:  190 QRS Duration: 116 QT Interval:  414 QTC Calculation: 509 R Axis:   78 Text Interpretation:  Normal sinus rhythm Left ventricular hypertrophy with QRS widening Nonspecific ST abnormality Prolonged QT When compared with ECG of 06-Nov-2009 20:18, QT has lengthened Confirmed by Charmika Macdonnell  MD-I, Serinity Ware ([id number]) on 05/11/2013 4:27:09 PM            MDM   patient appears to have new onset of congestive heart failure without chest pain. She was started on Lasix and nitroglycerin paste. She has had 4 nebulizer treatments for her bronchospasm with mild improvement.    1. Hypoxia   2. CHF (congestive heart failure)   3. Bronchospasm    Plan admission   CRITICAL CARE Performed by: Rolland Porter L Total critical care time: 31 min Critical care time was exclusive of separately billable procedures and treating other patients. Critical care was necessary to treat or prevent imminent or life-threatening deterioration. Critical care was time spent personally by me on the following activities: development of treatment plan with patient and/or surrogate as well as nursing, discussions with consultants, evaluation of patient's response to treatment, examination of patient, obtaining history from patient or surrogate, ordering and performing treatments and interventions, ordering and review of laboratory studies, ordering and review of radiographic studies, pulse oximetry and re-evaluation of patient's condition.    I personally performed the services described in this documentation, which was scribed in my presence. The recorded information has been reviewed and considered.  Rolland Porter, MD, Abram Sander    Janice Norrie, MD 05/11/13 [phone]

## 2013-05-11 NOTE — ED Notes (Signed)
Carlus Pavlov (son) - [phone] (home), [phone] (cell).

## 2013-05-11 NOTE — H&P (Signed)
[facility] History and Physical  Hannah Arias GEX:[id number] DOB: 10-08-1910 DOA: 05/11/2013  Referring physician: EDP PCP: Delphina Cahill, MD   Chief Complaint: feeling terrible since Monday evening.   HPI: Hannah Arias is a 80 y.o. female with prior h/o GERD, brought in by EMS for worsening sob, hypoxia. On arrival to ED, she was hypoxic, diffusely wheezing and her CXR revealed interstitial edema. She denies fever orchills. She denies tobacco smoking. She is referred to hospitalist service for evaluation and management of possible CHF.    Review of Systems:  POOR HISTORIAN. Most of the history avaialble from the son at bedside and EDP notes.  Past Medical History  Diagnosis Date  . GERD (gastroesophageal reflux disease)    Past Surgical History  Procedure Laterality Date  . Tonsillectomy and adenoidectomy      9 yr  . Appendectomy      80 yr old  . Tubal removal    . Hemorroidectomy    . Hernia repair    . Shoulder surgery      left rotator cuff  . Cataract extraction    . Elbow surgery    . Breast lumpectomy     Social History:  reports that she has never smoked. She does not have any smokeless tobacco history on file. She reports that she drinks alcohol. Her drug history is not on file.  Allergies  Allergen Reactions  . Aspirin     Stomach problem  . Codeine     hallucinate  . Penicillins Rash    Family History  Problem Relation Age of Onset  . Cancer    . Diabetes       Prior to Admission medications   Medication Sig Start Date End Date Taking? Authorizing Provider  benzonatate (TESSALON) 100 MG capsule Take 100 mg by mouth 2 (two) times daily as needed for cough.   Yes Historical Provider, MD  calcium carbonate (OS-CAL - DOSED IN MG OF ELEMENTAL CALCIUM) 1250 MG tablet Take 1 tablet by mouth.   Yes Historical Provider, MD  dextromethorphan-guaiFENesin (MUCINEX DM) 30-600 MG per 12 hr tablet Take 1 tablet by mouth 2 (two) times daily.   Yes Historical  Provider, MD  Multiple Vitamin (MULTIVITAMIN WITH MINERALS) TABS tablet Take 1 tablet by mouth daily.   Yes Historical Provider, MD  omeprazole (PRILOSEC) 20 MG capsule Take 20 mg by mouth daily.  07/22/10  Yes Historical Provider, MD   Physical Exam: Filed Vitals:   05/11/13 1803  BP: 113/75  Pulse: 87  Temp:   Resp: 24    BP 113/75  Pulse 87  Temp(Src) 98.8 F (37.1 C) (Oral)  Resp 24  Ht 5\' 3"  (1.6 m)  Wt 72.576 kg (160 lb)  BMI 28.35 kg/m2  SpO2 93%  General:  Appears calm and comfortable Eyes: PERRL, normal lids, irises & conjunctiva Cardiovascular: RRR, no m/r/g. No LE edema. Telemetry: SR, no arrhythmias  Respiratory:TACHYPNIC, bilateral wheezing heard.  Abdomen: soft, ntnd Skin: no rash or induration seen on limited exam Musculoskeletal: grossly normal tone BUE/BLE Psychiatric: grossly normal mood and affect, speech fluent and appropriate Neurologic: grossly non-focal.          Labs on Admission:  Basic Metabolic Panel:  Recent Labs Lab 05/11/13 1504  NA 137  K 3.5*  CL 99  CO2 25  GLUCOSE 157*  BUN 16  CREATININE 0.75  CALCIUM 8.8   Liver Function Tests:  Recent Labs Lab 05/11/13 1504  AST 22  ALT 10  ALKPHOS 63  BILITOT 0.6  PROT 6.8  ALBUMIN 3.5   No results found for this basename: LIPASE, AMYLASE,  in the last 168 hours No results found for this basename: AMMONIA,  in the last 168 hours CBC:  Recent Labs Lab 05/11/13 1504  WBC 6.5  NEUTROABS 4.6  HGB 11.8*  HCT 35.2*  MCV 93.4  PLT 125*   Cardiac Enzymes:  Recent Labs Lab 05/11/13 1504  TROPONINI <0.30    BNP (last 3 results)  Recent Labs  05/11/13 1504  PROBNP 1875.0*   CBG: No results found for this basename: GLUCAP,  in the last 168 hours  Radiological Exams on Admission: Dg Chest Portable 1 View  05/11/2013   CLINICAL DATA:  Cough and shortness of breath.  EXAM: PORTABLE CHEST - 1 VIEW  COMPARISON:  07/01/2012  FINDINGS: Chronic lung disease again noted.  The lung volumes are low. There may be a component of mild interstitial edema. No pleural fluid is identified. There is stable mild cardiomegaly and moderate tortuosity of the thoracic aorta.  IMPRESSION: Chronic lung disease, low lung volumes and possible component of mild interstitial edema.   Electronically Signed   By: Aletta Edouard M.D.   On: 05/11/2013 15:19    EKG: NSR with prolong ed QT.   Assessment/Plan Active Problems:   Hypoxia   CHF (congestive heart failure)   Hypoxia, Elevated pro bnp, and Interstitial edema: Admit to telemetry.  - evaluate for CHF, get serial troponins, echocardiogram and repeat EKG in am.  - oxygen to keep sats>90% - IV lasix 40 mg daily. And monitor renal function. Replete K as needed.  - bronchodilators as needed.  -levaquin for bronchitis.  - resp panel .    Mild anemia: continue to monitor.   DVT prophylaxis.     Code Status:  Presumed full code Family Communication: son at bedside, discussed the plan of care with the son. Disposition Plan: admit to telemetry.   Time spent: 75 min  [facility] Pager [phone]

## 2013-05-12 DIAGNOSIS — I10 Essential (primary) hypertension: Secondary | ICD-10-CM

## 2013-05-12 DIAGNOSIS — I059 Rheumatic mitral valve disease, unspecified: Secondary | ICD-10-CM

## 2013-05-12 DIAGNOSIS — K219 Gastro-esophageal reflux disease without esophagitis: Secondary | ICD-10-CM

## 2013-05-12 LAB — CBC
HCT: 32.2 % — ABNORMAL LOW (ref 36.0–46.0)
HEMOGLOBIN: 11.2 g/dL — AB (ref 12.0–15.0)
MCH: 32.3 pg (ref 26.0–34.0)
MCHC: 34.8 g/dL (ref 30.0–36.0)
MCV: 92.8 fL (ref 78.0–100.0)
PLATELETS: 119 10*3/uL — AB (ref 150–400)
RBC: 3.47 MIL/uL — ABNORMAL LOW (ref 3.87–5.11)
RDW: 13.3 % (ref 11.5–15.5)
WBC: 4 10*3/uL (ref 4.0–10.5)

## 2013-05-12 LAB — RESPIRATORY VIRUS PANEL
ADENOVIRUS: NOT DETECTED
INFLUENZA A H1: NOT DETECTED
INFLUENZA A: DETECTED — AB
Influenza A H3: DETECTED — AB
Influenza B: NOT DETECTED
Metapneumovirus: NOT DETECTED
PARAINFLUENZA 2 A: NOT DETECTED
Parainfluenza 1: NOT DETECTED
Parainfluenza 3: NOT DETECTED
RESPIRATORY SYNCYTIAL VIRUS B: NOT DETECTED
RHINOVIRUS: NOT DETECTED
Respiratory Syncytial Virus A: NOT DETECTED

## 2013-05-12 LAB — BASIC METABOLIC PANEL
BUN: 21 mg/dL (ref 6–23)
CO2: 26 mEq/L (ref 19–32)
Calcium: 8.4 mg/dL (ref 8.4–10.5)
Chloride: 101 mEq/L (ref 96–112)
Creatinine, Ser: 0.9 mg/dL (ref 0.50–1.10)
GFR calc Af Amer: 59 mL/min — ABNORMAL LOW (ref >90)
GFR, EST NON AFRICAN AMERICAN: 51 mL/min — AB (ref >90)
GLUCOSE: 137 mg/dL — AB (ref 70–99)
Potassium: 4.1 mEq/L (ref 3.7–5.3)
Sodium: 137 mEq/L (ref 137–147)

## 2013-05-12 LAB — LACTIC ACID, PLASMA: Lactic Acid, Venous: 1.5 mmol/L (ref 0.5–2.2)

## 2013-05-12 LAB — IRON AND TIBC
Iron: 20 ug/dL — ABNORMAL LOW (ref 42–135)
SATURATION RATIOS: 8 % — AB (ref 20–55)
TIBC: 242 ug/dL — ABNORMAL LOW (ref 250–470)
UIBC: 222 ug/dL (ref 125–400)

## 2013-05-12 LAB — FERRITIN: Ferritin: 58 ng/mL (ref 10–291)

## 2013-05-12 LAB — RETICULOCYTES
RBC.: 3.47 MIL/uL — AB (ref 3.87–5.11)
RETIC COUNT ABSOLUTE: 55.5 10*3/uL (ref 19.0–186.0)
RETIC CT PCT: 1.6 % (ref 0.4–3.1)

## 2013-05-12 LAB — VITAMIN B12: Vitamin B-12: 311 pg/mL (ref 211–911)

## 2013-05-12 LAB — FOLATE: Folate: >20 ng/mL

## 2013-05-12 MED ORDER — POLYETHYLENE GLYCOL 3350 17 G PO PACK
17.0000 g | PACK | Freq: Every day | ORAL | Status: DC
Start: 2013-05-12 — End: 2013-05-15
  Administered 2013-05-12 – 2013-05-14 (×3): 17 g via ORAL
  Filled 2013-05-12 (×4): qty 1

## 2013-05-12 MED ORDER — SENNOSIDES-DOCUSATE SODIUM 8.6-50 MG PO TABS
1.0000 | ORAL_TABLET | Freq: Two times a day (BID) | ORAL | Status: DC
  Administered 2013-05-12 – 2013-05-15 (×6): 1 via ORAL
  Filled 2013-05-12 (×6): qty 1

## 2013-05-12 MED ORDER — HYDRALAZINE HCL 20 MG/ML IJ SOLN
20.0000 mg | Freq: Four times a day (QID) | INTRAMUSCULAR | Status: DC | PRN
  Administered 2013-05-12: 20 mg via INTRAVENOUS
  Filled 2013-05-12: qty 1

## 2013-05-12 NOTE — Care Management Note (Signed)
    Page 1 of 1   05/12/2013     11:27:33 AM   CARE MANAGEMENT NOTE 05/12/2013  Patient:  SHERETTA, GRUMBINE   Account Number:  [id number]  Date Initiated:  05/12/2013  Documentation initiated by:  Theophilus Kinds  Subjective/Objective Assessment:   Pt admitted from home with CHF. Pt lives alone and has a son who lives close by that comes in twice a day to check on her. Pt is independent with ADL's and has a cane and walker for home use.     Action/Plan:   Pt is agreeable to [facility] RN with [facility] at discharge for CHF followup. Romualdo Bolk of [facility] is aware and will collect the pts information from the chart. Will continue to follow for discharge.   Anticipated DC Date:  05/14/2013   Anticipated DC Plan:  [place]  CM consult      Choice offered to / List presented to:             Status of service:  Completed, signed off Medicare Important Message given?   (If response is "NO", the following Medicare IM given date fields will be blank) Date Medicare IM given:   Date Additional Medicare IM given:    Discharge Disposition:  [place]  Per UR Regulation:    If discussed at Long Length of Stay Meetings, dates discussed:    Comments:  05/12/13 [place], RN BSN CM

## 2013-05-12 NOTE — Progress Notes (Signed)
Tech called and stated pt need a treatment I assessed pt and pt sat 94% on 1.5lpm cann and and bd diminished and clear. Pt in no distressed. No treatment needed at this time. Will continue to monitor pt though out the night.

## 2013-05-12 NOTE — Progress Notes (Signed)
[facility] PROGRESS NOTE  Hannah Arias OVF:[id number] DOB: December 06, 1910 DOA: 05/11/2013 PCP: Delphina Cahill, MD  Assessment/Plan: 1. Acute decompensated congestive heart failure with unknown ejection fraction. Patient presenting with some symptoms consistent with CHF exacerbation, have an elevated BNP and chest x-ray showing the presence of interstitial edema. Will continue with IV diuresis for the next 24 hours, followup on transthoracic echocardiogram, supportive care, monitor ins and outs. 2. Acute hypoxemic respiratory failure, evidence by increased respiratory rate of 24-28, new oxygen requirement, likely secondary to acute decompensated heart failure. Patient improved after the administration of IV Lasix. 3. Gastroesophageal reflux disease. Protonix 4. DVT prophylaxis. Lovenox  Code Status: Full code Family Communication: I spoke with patient's son present at bedside Disposition Plan: Continue IV diuresis, monitor ins and outs reassess in a.m.  Antibiotics:  Levaquin (started on 05/11/2013 discontinued on 05/10/2013)  HPI/Subjective: Patient is a pleasant 80 year old female with a past medical history of gastroesophageal reflux disease, presented to the emergency department overnight with complaints of increasing shortness of breath. Initial lab work revealed an elevated BNP of 1,875 with chest x-ray showing the presence of interstitial edema. She was treated for CHF exacerbation, started on IV Lasix 40 mg daily. This morning she reports feeling better stating "breathing easier." Patient having at fluid balance of 410 in the last 24 hours. He presently denies chest pain, tolerating by mouth intake.   Objective: Filed Vitals:   05/12/13 0501  BP: 144/67  Pulse: 69  Temp: 98.4 F (36.9 C)  Resp: 24    Intake/Output Summary (Last 24 hours) at 05/12/13 1102 Last data filed at 05/12/13 1052  Gross per 24 hour  Intake    360 ml  Output   1200 ml  Net   -840 ml   Filed  Weights   05/11/13 1434 05/11/13 2000  Weight: 72.576 kg (160 lb) 79.6 kg (175 lb 7.8 oz)    Exam:   General:  Patient is in no acute distress, awake alert oriented  Cardiovascular: Regular rate rhythm normal S1-S2  Respiratory: She has bibasilar crackles and rales, on supplemental oxygen, though does not appear to be in acute respiratory distress  Abdomen: Soft nontender nondistended  Musculoskeletal: Trace pedal edema  Data Reviewed: Basic Metabolic Panel:  Recent Labs Lab 05/11/13 1504 05/12/13 0451  NA 137 137  K 3.5* 4.1  CL 99 101  CO2 25 26  GLUCOSE 157* 137*  BUN 16 21  CREATININE 0.75 0.90  CALCIUM 8.8 8.4   Liver Function Tests:  Recent Labs Lab 05/11/13 1504  AST 22  ALT 10  ALKPHOS 63  BILITOT 0.6  PROT 6.8  ALBUMIN 3.5   No results found for this basename: LIPASE, AMYLASE,  in the last 168 hours No results found for this basename: AMMONIA,  in the last 168 hours CBC:  Recent Labs Lab 05/11/13 1504 05/12/13 0451  WBC 6.5 4.0  NEUTROABS 4.6  --   HGB 11.8* 11.2*  HCT 35.2* 32.2*  MCV 93.4 92.8  PLT 125* 119*   Cardiac Enzymes:  Recent Labs Lab 05/11/13 1504 05/11/13 2213  TROPONINI <0.30 <0.30   BNP (last 3 results)  Recent Labs  05/11/13 1504  PROBNP 1875.0*   CBG: No results found for this basename: GLUCAP,  in the last 168 hours  No results found for this or any previous visit (from the past 240 hour(s)).   Studies: Dg Chest Portable 1 View  05/11/2013   CLINICAL DATA:  Cough and shortness  of breath.  EXAM: PORTABLE CHEST - 1 VIEW  COMPARISON:  07/01/2012  FINDINGS: Chronic lung disease again noted. The lung volumes are low. There may be a component of mild interstitial edema. No pleural fluid is identified. There is stable mild cardiomegaly and moderate tortuosity of the thoracic aorta.  IMPRESSION: Chronic lung disease, low lung volumes and possible component of mild interstitial edema.   Electronically Signed   By:  Aletta Edouard M.D.   On: 05/11/2013 15:19    Scheduled Meds: . dextromethorphan-guaiFENesin  1 tablet Oral BID  . enoxaparin (LOVENOX) injection  40 mg Subcutaneous Q24H  . furosemide  40 mg Intravenous Daily  . levofloxacin  500 mg Oral Daily  . pantoprazole  40 mg Oral Q0600  . sodium chloride  3 mL Intravenous Q12H   Continuous Infusions:   Active Problems:   Hypoxia   CHF (congestive heart failure)    Time spent: 35 minutes    Kelvin Cellar  [facility] Pager [phone]. If 7PM-7AM, please contact night-coverage at [email or web address], password [email or web address] 05/12/2013, 11:02 AM  LOS: 1 day

## 2013-05-12 NOTE — Evaluation (Signed)
Physical Therapy Evaluation Patient Details Name: ZAMIAH TOLLETT MRN: [id number] DOB: 1910-08-08 Today's Date: 05/12/2013 Time: 2620-3559 PT Time Calculation (min): 35 min  PT Assessment / Plan / Recommendation History of Present Illness  This is a delightful 80 year old pt who is admitted with CHF.  She is normally independent with ADLs at home alone with support of family nearby.  Clinical Impression   Pt was seen for evaluation.  She had no c/o and felt "fine".  Her O2 sats on RA were WNL (97% at rest and 91% after gait).  She did have dyspnea throughout my visit.  Pt should be able to transition to home at d/c without any PT intervention.    PT Assessment  Patent does not need any further PT services    Follow Up Recommendations  No PT follow up    Does the patient have the potential to tolerate intense rehabilitation      Barriers to Discharge        Equipment Recommendations  None recommended by PT    Recommendations for Other Services     Frequency      Precautions / Restrictions Precautions Precautions: None Restrictions Weight Bearing Restrictions: No   Pertinent Vitals/Pain       Mobility  Bed Mobility Overal bed mobility: Modified Independent Transfers Overall transfer level: Modified independent Equipment used: None Ambulation/Gait Ambulation/Gait assistance: Modified independent (Device/Increase time) Ambulation Distance (Feet): 175 Feet Assistive device: Rolling walker (2 wheeled) Gait velocity: WNL    Exercises     PT Diagnosis:    PT Problem List:   PT Treatment Interventions:       PT Goals(Current goals can be found in the care plan section) Acute Rehab PT Goals PT Goal Formulation: No goals set, d/c therapy  Visit Information  Last PT Received On: 05/12/13 History of Present Illness: This is a delightful 80 year old pt who is admitted with CHF.  She is normally independent with ADLs at home alone with support of family nearby.        Prior [employer] expects to be discharged to:: Private residence Living Arrangements: Alone Available Help at Discharge: Family;Available PRN/intermittently Type of Home: House Home Access: Level entry Home Layout: One level Home Equipment: Walker - 2 wheels;Cane - single point Prior Function Level of Independence: Independent with assistive device(s) Comments: usually uses a walker in the home Communication Communication: HOH    Cognition  Cognition Arousal/Alertness: Awake/alert Behavior During Therapy: WFL for tasks assessed/performed Overall Cognitive Status: Within Functional Limits for tasks assessed    Extremity/Trunk Assessment Lower Extremity Assessment Lower Extremity Assessment: Overall WFL for tasks assessed   Balance Balance Overall balance assessment: No apparent balance deficits (not formally assessed)  End of Session PT - End of Session Equipment Utilized During Treatment: Gait belt Activity Tolerance: Patient tolerated treatment well Patient left: in bed;with call bell/phone within reach;with bed alarm set  GP     Demetrios Isaacs L 05/12/2013, 1:41 PM

## 2013-05-12 NOTE — Progress Notes (Signed)
UR chart review completed.  

## 2013-05-12 NOTE — Progress Notes (Signed)
*  PRELIMINARY RESULTS* Echocardiogram 2D Echocardiogram has been performed.  [place], [place] 05/12/2013, 5:01 PM

## 2013-05-13 ENCOUNTER — Inpatient Hospital Stay (HOSPITAL_COMMUNITY): Payer: Medicare Other

## 2013-05-13 LAB — BASIC METABOLIC PANEL
BUN: 29 mg/dL — AB (ref 6–23)
CHLORIDE: 101 meq/L (ref 96–112)
CO2: 26 mEq/L (ref 19–32)
Calcium: 8.5 mg/dL (ref 8.4–10.5)
Creatinine, Ser: 0.94 mg/dL (ref 0.50–1.10)
GFR calc Af Amer: 56 mL/min — ABNORMAL LOW (ref >90)
GFR calc non Af Amer: 48 mL/min — ABNORMAL LOW (ref >90)
Glucose, Bld: 96 mg/dL (ref 70–99)
POTASSIUM: 4.3 meq/L (ref 3.7–5.3)
Sodium: 136 mEq/L — ABNORMAL LOW (ref 137–147)

## 2013-05-13 LAB — CBC
HEMATOCRIT: 34 % — AB (ref 36.0–46.0)
HEMOGLOBIN: 11.5 g/dL — AB (ref 12.0–15.0)
MCH: 31.7 pg (ref 26.0–34.0)
MCHC: 33.8 g/dL (ref 30.0–36.0)
MCV: 93.7 fL (ref 78.0–100.0)
Platelets: 98 10*3/uL — ABNORMAL LOW (ref 150–400)
RBC: 3.63 MIL/uL — ABNORMAL LOW (ref 3.87–5.11)
RDW: 13.7 % (ref 11.5–15.5)
WBC: 4.7 10*3/uL (ref 4.0–10.5)

## 2013-05-13 MED ORDER — VITAMIN B-12 1000 MCG PO TABS
500.0000 ug | ORAL_TABLET | Freq: Every day | ORAL | Status: DC
  Administered 2013-05-13 – 2013-05-15 (×3): 500 ug via ORAL
  Filled 2013-05-13 (×3): qty 1

## 2013-05-13 MED ORDER — ACETAMINOPHEN 500 MG PO TABS
500.0000 mg | ORAL_TABLET | Freq: Once | ORAL | Status: AC
  Administered 2013-05-13: 500 mg via ORAL
  Filled 2013-05-13: qty 1

## 2013-05-13 MED ORDER — FERROUS SULFATE 325 (65 FE) MG PO TABS
325.0000 mg | ORAL_TABLET | Freq: Two times a day (BID) | ORAL | Status: DC
  Administered 2013-05-13 – 2013-05-14 (×3): 325 mg via ORAL
  Filled 2013-05-13 (×4): qty 1

## 2013-05-13 MED ORDER — FUROSEMIDE 40 MG PO TABS: 40.0000 mg | ORAL_TABLET | Freq: Every day | ORAL | Status: DC

## 2013-05-13 NOTE — Progress Notes (Signed)
[facility] PROGRESS NOTE  Hannah Arias EOF:[id number] DOB: 11/27/1910 DOA: 05/11/2013 PCP: Delphina Cahill, MD  Assessment/Plan: 1. Acute decompensated congestive heart failure with unknown ejection fraction. Patient presenting with some symptoms consistent with CHF exacerbation, have an elevated BNP and chest x-ray showing the presence of interstitial edema.  Echocardiogram showed preserved LV function but diastolic dysfunction. Changed IV lasix to po lasix.  2. Acute hypoxemic respiratory failure, evidence by increased respiratory rate of 24-28, new oxygen requirement, likely secondary to acute decompensated heart failure. Patient improved after the administration of IV Lasix. Her influenza pcr  Came positive . But it sbeen 5 days since her symptoms started, hence we will not start her on tamiflu.  3. Gastroesophageal reflux disease. Protonix 4. DVT prophylaxis. Lovenox  Code Status: Full code Family Communication: none at bedside.  Disposition Plan: possible discharge in am .  Antibiotics:  Levaquin (started on 05/11/2013 discontinued on 05/10/2013)  HPI/Subjective: Breathing better. Repeat CXR shows no interstitial edema.  Objective: Filed Vitals:   05/13/13 0443  BP: 126/54  Pulse: 64  Temp: 98.4 F (36.9 C)  Resp: 22    Intake/Output Summary (Last 24 hours) at 05/13/13 1444 Last data filed at 05/13/13 1230  Gross per 24 hour  Intake    720 ml  Output   1500 ml  Net   -780 ml   Filed Weights   05/11/13 1434 05/11/13 2000  Weight: 72.576 kg (160 lb) 79.6 kg (175 lb 7.8 oz)    Exam:   General:  Patient is in no acute distress, awake alert oriented  Cardiovascular: Regular rate rhythm normal S1-S2  Respiratory: good air entry bilateral.   Abdomen: Soft nontender nondistended  Musculoskeletal: Trace pedal edema  Data Reviewed: Basic Metabolic Panel:  Recent Labs Lab 05/11/13 1504 05/12/13 0451 05/13/13 0518  NA 137 137 136*  K 3.5* 4.1 4.3  CL 99  101 101  CO2 _0 GLUCOSE 157* 137* 96  BUN 16 21 29*  CREATININE 0.75 0.90 0.94  CALCIUM 8.8 8.4 8.5   Liver Function Tests:  Recent Labs Lab 05/11/13 1504  AST 22  ALT 10  ALKPHOS 63  BILITOT 0.6  PROT 6.8  ALBUMIN 3.5   No results found for this basename: LIPASE, AMYLASE,  in the last 168 hours No results found for this basename: AMMONIA,  in the last 168 hours CBC:  Recent Labs Lab 05/11/13 1504 05/12/13 0451 05/13/13 0518  WBC 6.5 4.0 4.7  NEUTROABS 4.6  --   --   HGB 11.8* 11.2* 11.5*  HCT 35.2* 32.2* 34.0*  MCV 93.4 92.8 93.7  PLT 125* 119* 98*   Cardiac Enzymes:  Recent Labs Lab 05/11/13 1504 05/11/13 2213  TROPONINI <0.30 <0.30   BNP (last 3 results)  Recent Labs  05/11/13 1504  PROBNP 1875.0*   CBG: No results found for this basename: GLUCAP,  in the last 168 hours  Recent Results (from the past 240 hour(s))  RESPIRATORY VIRUS PANEL     Status: Abnormal   Collection Time    05/11/13  6:50 PM      Result Value Range Status   Source - [facility] NASOPHARYNGEAL SWAB   Corrected   Comment: CORRECTED ON 01/22 AT 2006: PREVIOUSLY REPORTED AS NASOPHARYNGEAL SWAB   Respiratory Syncytial Virus A NOT DETECTED   Final   Respiratory Syncytial Virus B NOT DETECTED   Final   Influenza A DETECTED (*)  Final   Influenza B NOT DETECTED  [facility] PROGRESS NOTE  Hannah Arias EOF:[id number] DOB: 11/27/1910 DOA: 05/11/2013 PCP: Delphina Cahill, MD  Assessment/Plan: 1. Acute decompensated congestive heart failure with unknown ejection fraction. Patient presenting with some symptoms consistent with CHF exacerbation, have an elevated BNP and chest x-ray showing the presence of interstitial edema.  Echocardiogram showed preserved LV function but diastolic dysfunction. Changed IV lasix to po lasix.  2. Acute hypoxemic respiratory failure, evidence by increased respiratory rate of 24-28, new oxygen requirement, likely secondary to acute decompensated heart failure. Patient improved after the administration of IV Lasix. Her influenza pcr  Came positive . But it sbeen 5 days since her symptoms started, hence we will not start her on tamiflu.  3. Gastroesophageal reflux disease. Protonix 4. DVT prophylaxis. Lovenox  Code Status: Full code Family Communication: none at bedside.  Disposition Plan: possible discharge in am .  Antibiotics:  Levaquin (started on 05/11/2013 discontinued on 05/10/2013)  HPI/Subjective: Breathing better. Repeat CXR shows no interstitial edema.  Objective: Filed Vitals:   05/13/13 0443  BP: 126/54  Pulse: 64  Temp: 98.4 F (36.9 C)  Resp: 22    Intake/Output Summary (Last 24 hours) at 05/13/13 1444 Last data filed at 05/13/13 1230  Gross per 24 hour  Intake    720 ml  Output   1500 ml  Net   -780 ml   Filed Weights   05/11/13 1434 05/11/13 2000  Weight: 72.576 kg (160 lb) 79.6 kg (175 lb 7.8 oz)    Exam:   General:  Patient is in no acute distress, awake alert oriented  Cardiovascular: Regular rate rhythm normal S1-S2  Respiratory: good air entry bilateral.   Abdomen: Soft nontender nondistended  Musculoskeletal: Trace pedal edema  Data Reviewed: Basic Metabolic Panel:  Recent Labs Lab 05/11/13 1504 05/12/13 0451 05/13/13 0518  NA 137 137 136*  K 3.5* 4.1 4.3  CL 99  101 101  CO2 _0 GLUCOSE 157* 137* 96  BUN 16 21 29*  CREATININE 0.75 0.90 0.94  CALCIUM 8.8 8.4 8.5   Liver Function Tests:  Recent Labs Lab 05/11/13 1504  AST 22  ALT 10  ALKPHOS 63  BILITOT 0.6  PROT 6.8  ALBUMIN 3.5   No results found for this basename: LIPASE, AMYLASE,  in the last 168 hours No results found for this basename: AMMONIA,  in the last 168 hours CBC:  Recent Labs Lab 05/11/13 1504 05/12/13 0451 05/13/13 0518  WBC 6.5 4.0 4.7  NEUTROABS 4.6  --   --   HGB 11.8* 11.2* 11.5*  HCT 35.2* 32.2* 34.0*  MCV 93.4 92.8 93.7  PLT 125* 119* 98*   Cardiac Enzymes:  Recent Labs Lab 05/11/13 1504 05/11/13 2213  TROPONINI <0.30 <0.30   BNP (last 3 results)  Recent Labs  05/11/13 1504  PROBNP 1875.0*   CBG: No results found for this basename: GLUCAP,  in the last 168 hours  Recent Results (from the past 240 hour(s))  RESPIRATORY VIRUS PANEL     Status: Abnormal   Collection Time    05/11/13  6:50 PM      Result Value Range Status   Source - [facility] NASOPHARYNGEAL SWAB   Corrected   Comment: CORRECTED ON 01/22 AT 2006: PREVIOUSLY REPORTED AS NASOPHARYNGEAL SWAB   Respiratory Syncytial Virus A NOT DETECTED   Final   Respiratory Syncytial Virus B NOT DETECTED   Final   Influenza A DETECTED (*)  Final   Influenza B NOT DETECTED

## 2013-05-13 NOTE — Progress Notes (Signed)
OT Cancellation Note  Patient Details Name: Hannah Arias MRN: [id number] DOB: Feb 15, 1911   Cancelled Treatment:    Reason Eval/Treat Not Completed: OT screened, no needs identified, will sign off (Patient reports being at baseline wtih ADL tasks and states she utilizes AE (sock aide,shoehorn) at home.  no skilled OT services recommended, pt states she does not need )  Donney Rankins, OTR/L   05/13/2013, 4:42 PM

## 2013-05-14 MED ORDER — IPRATROPIUM-ALBUTEROL 0.5-2.5 (3) MG/3ML IN SOLN
3.0000 mL | Freq: Four times a day (QID) | RESPIRATORY_TRACT | Status: DC
  Administered 2013-05-14 – 2013-05-15 (×5): 3 mL via RESPIRATORY_TRACT
  Filled 2013-05-14 (×6): qty 3

## 2013-05-14 NOTE — Progress Notes (Signed)
[facility] PROGRESS NOTE  Hannah Arias ZOX:[id number] DOB: [birth year] DOA: 05/11/2013 PCP: Hannah Pizza, MD  Assessment/Plan: 1. Acute decompensated congestive heart failure with unknown ejection fraction. Patient presenting with some symptoms consistent with CHF exacerbation, have an elevated BNP and chest x-ray showing the presence of interstitial edema.  Echocardiogram showed preserved LV function but diastolic dysfunction. Changed IV lasix to po lasix. Repeat CXR shows resolution of the interstitial edema, but emphysema seen. She is also wheezing currently, hence will start on bronchodilators.  2. Acute hypoxemic respiratory failure, evidence by increased respiratory rate of 24-28, new oxygen requirement, likely secondary to acute decompensated heart failure. Patient improved after the administration of IV Lasix. Her influenza pcr  Came positive . But it sbeen 5 days since her symptoms started, hence we will not start her on tamiflu.  3. Gastroesophageal reflux disease. Protonix 4. DVT prophylaxis. Lovenox  Code Status: Full code Family Communication: none at bedside.  Disposition Plan: possible discharge in am .  Antibiotics:  Levaquin (started on 05/09/2013 discontinued on 05/10/2013)  HPI/Subjective: Breathing better. Repeat CXR shows no interstitial edema.  Objective: Filed Vitals:   05/14/13 1320  BP: 113/58  Pulse: 85  Temp: 97.4 F (36.3 C)  Resp: 20    Intake/Output Summary (Last 24 hours) at 05/14/13 1505 Last data filed at 05/14/13 1240  Gross per 24 hour  Intake    453 ml  Output   1326 ml  Net   -873 ml   Filed Weights   05/11/13 1434 05/11/13 2000  Weight: 72.576 kg (160 lb) 79.6 kg (175 lb 7.8 oz)    Exam:   General:  Patient is in no acute distress, awake alert oriented  Cardiovascular: Regular rate rhythm normal S1-S2  Respiratory: good air entry bilateral.   Abdomen: Soft nontender nondistended  Musculoskeletal: Trace pedal  edema  Data Reviewed: Basic Metabolic Panel:  Recent Labs Lab 05/11/13 1504 05/12/13 0451 05/13/13 0518  NA 137 137 136*  K 3.5* 4.1 4.3  CL 99 101 101  CO2 25 26 26   GLUCOSE 157* 137* 96  BUN 16 21 29*  CREATININE 0.75 0.90 0.94  CALCIUM 8.8 8.4 8.5   Liver Function Tests:  Recent Labs Lab 05/11/13 1504  AST 22  ALT 10  ALKPHOS 63  BILITOT 0.6  PROT 6.8  ALBUMIN 3.5   No results found for this basename: LIPASE, AMYLASE,  in the last 168 hours No results found for this basename: AMMONIA,  in the last 168 hours CBC:  Recent Labs Lab 05/11/13 1504 05/12/13 0451 05/13/13 0518  WBC 6.5 4.0 4.7  NEUTROABS 4.6  --   --   HGB 11.8* 11.2* 11.5*  HCT 35.2* 32.2* 34.0*  MCV 93.4 92.8 93.7  PLT 125* 119* 98*   Cardiac Enzymes:  Recent Labs Lab 05/11/13 1504 05/11/13 2213  TROPONINI <0.30 <0.30   BNP (last 3 results)  Recent Labs  05/11/13 1504  PROBNP 1875.0*   CBG: No results found for this basename: GLUCAP,  in the last 168 hours  Recent Results (from the past 240 hour(s))  RESPIRATORY VIRUS PANEL     Status: Abnormal   Collection Time    05/11/13  6:50 PM      Result Value Range Status   Source - [facility] NASOPHARYNGEAL SWAB   Corrected   Comment: CORRECTED ON 01/22 AT 2006: PREVIOUSLY REPORTED AS NASOPHARYNGEAL SWAB   Respiratory Syncytial Virus A NOT DETECTED   Final   Respiratory Syncytial  Virus B NOT DETECTED   Final   Influenza A DETECTED (*)  Final   Influenza B NOT DETECTED   Final   Parainfluenza 1 NOT DETECTED   Final   Parainfluenza 2 NOT DETECTED   Final   Parainfluenza 3 NOT DETECTED   Final   Metapneumovirus NOT DETECTED   Final   Rhinovirus NOT DETECTED   Final   Adenovirus NOT DETECTED   Final   Influenza A H1 NOT DETECTED   Final   Influenza A H3 DETECTED (*)  Final   Comment: (NOTE)           Normal Reference Range for each Analyte: NOT DETECTED     Testing performed using the [employer] xTAG Respiratory Viral Panel test      kit.     This test was developed and its performance characteristics determined     by [facility]. It has not been cleared or approved by the [place]     Food and Drug Administration. This test is used for clinical purposes.     It should not be regarded as investigational or for research. This     laboratory is certified under the Clinical Laboratory Improvement     Amendments of 1988 (CLIA) as qualified to perform high complexity     clinical laboratory testing.     Performed at [facility]     Studies: Dg Chest 2 View  05/13/2013   CLINICAL DATA:  Cough.  EXAM: CHEST  2 VIEW  COMPARISON:  Chest x-rays dated 05/11/2013, 07/01/2012, and 08/25/2004  FINDINGS: Heart size is normal. There is tortuosity and calcification of the thoracic aorta. There is slight chronic scarring at the lung bases. Pulmonary vascularity is now normal. Chronic peribronchial thickening. No effusions. Lungs are somewhat hyperinflated suggesting emphysema. Thoracolumbar scoliosis. Degenerative changes of both shoulders. Tortuous brachiocephalic vessels, stable.  IMPRESSION: No acute abnormality. The pulmonary vascularity is now normal. Probable emphysema.   Electronically Signed   By: Geanie Cooley M.D.   On: 05/13/2013 13:35    Scheduled Meds: . dextromethorphan-guaiFENesin  1 tablet Oral BID  . ferrous sulfate  325 mg Oral BID WC  . ipratropium-albuterol  3 mL Nebulization Q6H WA  . pantoprazole  40 mg Oral Q0600  . polyethylene glycol  17 g Oral Daily  . senna-docusate  1 tablet Oral BID  . sodium chloride  3 mL Intravenous Q12H  . vitamin B-12  500 mcg Oral Daily   Continuous Infusions:   Active Problems:   Hypoxia   CHF (congestive heart failure)    Time spent: 35 minutes    [name]  [facility] Pager [phone]. If 7PM-7AM, please contact night-coverage at [email or web address], password [facility] 05/14/2013, 3:05 PM  LOS: 3 days

## 2013-05-15 DIAGNOSIS — J9801 Acute bronchospasm: Secondary | ICD-10-CM

## 2013-05-15 MED ORDER — CYANOCOBALAMIN 500 MCG PO TABS: 500.0000 ug | ORAL_TABLET | Freq: Every day | ORAL | Status: DC

## 2013-05-15 MED ORDER — SENNOSIDES-DOCUSATE SODIUM 8.6-50 MG PO TABS: 1.0000 | ORAL_TABLET | Freq: Two times a day (BID) | ORAL | Status: DC

## 2013-05-15 MED ORDER — ALBUTEROL SULFATE (2.5 MG/3ML) 0.083% IN NEBU: 2.5000 mg | INHALATION_SOLUTION | RESPIRATORY_TRACT | Status: DC | PRN

## 2013-05-15 MED ORDER — PREDNISONE 20 MG PO TABS: ORAL_TABLET | ORAL | Status: DC

## 2013-05-15 MED ORDER — PREDNISONE 20 MG PO TABS: 40.0000 mg | ORAL_TABLET | Freq: Every day | ORAL | Status: DC

## 2013-05-15 MED ORDER — FERROUS SULFATE 325 (65 FE) MG PO TABS: 325.0000 mg | ORAL_TABLET | Freq: Two times a day (BID) | ORAL | Status: DC

## 2013-05-15 NOTE — Discharge Summary (Addendum)
Physician Discharge Summary  Hannah Arias ZOX:[id number] DOB: 07/01/1910 DOA: 05/11/2013  PCP: Catalina Pizza, MD  Admit date: 05/11/2013 Discharge date: 05/15/2013  Time spent: 30 minutes  Recommendations for Outpatient Follow-up:  1. Follow up with PCP in one week.   Discharge Diagnoses:  Active Problems:   Hypoxia   CHF (congestive heart failure) acute diastolic heart failure Influenza positive.  Copd exacerbation   Discharge Condition: improved.   Diet recommendation: low sodium diet  Filed Weights   05/11/13 1434 05/11/13 2000  Weight: 72.576 kg (160 lb) 79.6 kg (175 lb 7.8 oz)    History of present illness:  Hannah Arias is a 80 y.o. female with prior h/o GERD, brought in by EMS for worsening sob, hypoxia. On arrival to ED, she was hypoxic, diffusely wheezing and her CXR revealed interstitial edema. She denies fever orchills. She denies tobacco smoking. She is referred to hospitalist service for evaluation and management of possible CHF.      Hospital Course:  Acute decompensated congestive heart failure with unknown ejection fraction. Patient presenting with some symptoms consistent with CHF exacerbation, have an elevated BNP and chest x-ray showing the presence of interstitial edema. Echocardiogram showed preserved LV function but diastolic dysfunction. Her symptoms improved and we Changed IV lasix to po lasix. Repeat CXR shows resolution of the interstitial edema, but emphysema seen. She is also wheezing currently, hence will start on bronchodilators.   Acute hypoxemic respiratory failure, evidence by increased respiratory rate of 24-28, new oxygen requirement, likely secondary to acute decompensated heart failure and mild copd exacerbation. . Patient improved after the administration of IV Lasix. Her influenza pcr Came positive . But it sbeen 5 days since her symptoms started, hence we will not start her on tamiflu.   Gastroesophageal reflux disease.  Protonix   Procedures:  ECHO  Consultations:  NONE  Discharge Exam: Filed Vitals:   05/15/13 0628  BP: 138/54  Pulse: 64  Temp: 97.8 F (36.6 C)  Resp: 20   General: Patient is in no acute distress, awake alert oriented Cardiovascular: Regular rate rhythm normal S1-S2 Respiratory: good air entry bilateral. Abdomen: Soft nontender nondistended Musculoskeletal: Trace pedal edema    Discharge Instructions  Discharge Orders   Future Appointments Provider Department Dept Phone   05/18/2013 1:30 PM Alvan Dame, DPM [facility] [phone]   Future Orders Complete By Expires   Discharge instructions  As directed    Comments:     Follow u pwithPCP in one week.       Medication List         albuterol (2.5 MG/3ML) 0.083% nebulizer solution  Commonly known as:  PROVENTIL  Take 3 mLs (2.5 mg total) by nebulization every 4 (four) hours as needed for wheezing or shortness of breath.     benzonatate 100 MG capsule  Commonly known as:  TESSALON  Take 100 mg by mouth 2 (two) times daily as needed for cough.     calcium carbonate 1250 MG tablet  Commonly known as:  OS-CAL - dosed in mg of elemental calcium  Take 1 tablet by mouth.     cyanocobalamin 500 MCG tablet  Take 1 tablet (500 mcg total) by mouth daily.     dextromethorphan-guaiFENesin 30-600 MG per 12 hr tablet  Commonly known as:  MUCINEX DM  Take 1 tablet by mouth 2 (two) times daily.     ferrous sulfate 325 (65 FE) MG tablet  Take 1 tablet (325 mg  total) by mouth 2 (two) times daily with a meal.     multivitamin with minerals Tabs tablet  Take 1 tablet by mouth daily.     omeprazole 20 MG capsule  Commonly known as:  PRILOSEC  Take 20 mg by mouth daily.     predniSONE 20 MG tablet  Commonly known as:  DELTASONE  - Prednisone 30 mg daily for2 days followed by  - Prednisone 20 mg daily for 2 days followed by   - Prednisone 10 mg daily for 2 days.     senna-docusate 8.6-50 MG  per tablet  Commonly known as:  Senokot-S  Take 1 tablet by mouth 2 (two) times daily.       Allergies  Allergen Reactions  . Aspirin     Stomach problem  . Codeine     hallucinate  . Penicillins Rash       Follow-up Information   Follow up with [facility].   Contact information:   [address] [place] [address] [phone]      Follow up with Catalina Pizza, MD. Schedule an appointment as soon as possible for a visit in 1 week.   Specialty:  Internal Medicine   Contact information:    [address]  [place] [address] [phone]        The results of significant diagnostics from this hospitalization (including imaging, microbiology, ancillary and laboratory) are listed below for reference.    Significant Diagnostic Studies: Dg Chest 2 View  05/13/2013   CLINICAL DATA:  Cough.  EXAM: CHEST  2 VIEW  COMPARISON:  Chest x-rays dated 05/11/2013, 07/01/2012, and 08/25/2004  FINDINGS: Heart size is normal. There is tortuosity and calcification of the thoracic aorta. There is slight chronic scarring at the lung bases. Pulmonary vascularity is now normal. Chronic peribronchial thickening. No effusions. Lungs are somewhat hyperinflated suggesting emphysema. Thoracolumbar scoliosis. Degenerative changes of both shoulders. Tortuous brachiocephalic vessels, stable.  IMPRESSION: No acute abnormality. The pulmonary vascularity is now normal. Probable emphysema.   Electronically Signed   By: Geanie Cooley M.D.   On: 05/13/2013 13:35   Dg Chest Portable 1 View  05/11/2013   CLINICAL DATA:  Cough and shortness of breath.  EXAM: PORTABLE CHEST - 1 VIEW  COMPARISON:  07/01/2012  FINDINGS: Chronic lung disease again noted. The lung volumes are low. There may be a component of mild interstitial edema. No pleural fluid is identified. There is stable mild cardiomegaly and moderate tortuosity of the thoracic aorta.  IMPRESSION: Chronic lung disease, low lung volumes and possible  component of mild interstitial edema.   Electronically Signed   By: Irish Lack M.D.   On: 05/11/2013 15:19    Microbiology: Recent Results (from the past 240 hour(s))  RESPIRATORY VIRUS PANEL     Status: Abnormal   Collection Time    05/11/13  6:50 PM      Result Value Range Status   Source - RVPAN NASOPHARYNGEAL SWAB   Corrected   Comment: CORRECTED ON 01/22 AT 2006: PREVIOUSLY REPORTED AS NASOPHARYNGEAL SWAB   Respiratory Syncytial Virus A NOT DETECTED   Final   Respiratory Syncytial Virus B NOT DETECTED   Final   Influenza A DETECTED (*)  Final   Influenza B NOT DETECTED   Final   Parainfluenza 1 NOT DETECTED   Final   Parainfluenza 2 NOT DETECTED   Final   Parainfluenza 3 NOT DETECTED   Final   Metapneumovirus NOT DETECTED  Final   Rhinovirus NOT DETECTED   Final   Adenovirus NOT DETECTED   Final   Influenza A H1 NOT DETECTED   Final   Influenza A H3 DETECTED (*)  Final   Comment: (NOTE)           Normal Reference Range for each Analyte: NOT DETECTED     Testing performed using the Luminex xTAG Respiratory Viral Panel test     kit.     This test was developed and its performance characteristics determined     by [employer] [facility]. It has not been cleared or approved by the [place]     Food and Drug Administration. This test is used for clinical purposes.     It should not be regarded as investigational or for research. This     laboratory is certified under the Clinical Laboratory Improvement     Amendments of 1988 (CLIA) as qualified to perform high complexity     clinical laboratory testing.     Performed at [facility]: Basic Metabolic Panel:  Recent Labs Lab 05/11/13 1504 05/12/13 0451 05/13/13 0518  NA 137 137 136*  K 3.5* 4.1 4.3  CL 99 101 101  CO2 25 26 26   GLUCOSE 157* 137* 96  BUN 16 21 29*  CREATININE 0.75 0.90 0.94  CALCIUM 8.8 8.4 8.5   Liver Function Tests:  Recent Labs Lab 05/11/13 1504  AST 22  ALT 10  ALKPHOS 63   BILITOT 0.6  PROT 6.8  ALBUMIN 3.5   No results found for this basename: LIPASE, AMYLASE,  in the last 168 hours No results found for this basename: AMMONIA,  in the last 168 hours CBC:  Recent Labs Lab 05/11/13 1504 05/12/13 0451 05/13/13 0518  WBC 6.5 4.0 4.7  NEUTROABS 4.6  --   --   HGB 11.8* 11.2* 11.5*  HCT 35.2* 32.2* 34.0*  MCV 93.4 92.8 93.7  PLT 125* 119* 98*   Cardiac Enzymes:  Recent Labs Lab 05/11/13 1504 05/11/13 2213  TROPONINI <0.30 <0.30   BNP: BNP (last 3 results)  Recent Labs  05/11/13 1504  PROBNP 1875.0*   CBG: No results found for this basename: GLUCAP,  in the last 168 hours     Signed:  Arren Laminack  [facility] 05/15/2013, 12:49 PM

## 2013-05-15 NOTE — Plan of Care (Signed)
Problem: Discharge Progression Outcomes Goal: Activity appropriate for discharge plan Outcome: Completed/Met Date Met:  05/15/13 Ambulated in hallway without difficulty, saturation prior to ambulating was 94 percent on room air,while ambulating sats were 91 percent.

## 2013-05-17 NOTE — Progress Notes (Signed)
Patient discharged with instructions given on medications,and follow up visits,patient verbalized understanding. Prescriptions sent with patient. No c/o pain or discomfort noted. Accompanied by staff to an awaiting vehicle. 

## 2013-05-18 ENCOUNTER — Ambulatory Visit: Payer: Medicare Other

## 2013-05-23 NOTE — Progress Notes (Signed)
UR chart review completed.  

## 2013-07-27 ENCOUNTER — Ambulatory Visit (INDEPENDENT_AMBULATORY_CARE_PROVIDER_SITE_OTHER): Payer: Medicare Other

## 2013-07-27 VITALS — BP 136/74 | HR 68 | Resp 18

## 2013-07-27 DIAGNOSIS — M79609 Pain in unspecified limb: Secondary | ICD-10-CM

## 2013-07-27 DIAGNOSIS — Q828 Other specified congenital malformations of skin: Secondary | ICD-10-CM

## 2013-07-27 DIAGNOSIS — M1990 Unspecified osteoarthritis, unspecified site: Secondary | ICD-10-CM

## 2013-07-27 DIAGNOSIS — B351 Tinea unguium: Secondary | ICD-10-CM

## 2013-07-27 NOTE — Patient Instructions (Signed)

## 2013-07-27 NOTE — Progress Notes (Signed)
   Subjective:    Patient ID: Hannah Arias, female    DOB: Jul 27, 1910, 80 y.o.   MRN: [id number]  HPI I am here to get my toenails trimmed up and was in the hospital and had fluid in my chest and was there for four days and went in May 11, 2013     Review of Systems used any changes or findings of the be hospitalized for excess fluid in her chest and lungs.    Objective:   Physical Exam 80 year old white female persists this time for followup nail care and painful corn fourth toe left foot. Vascular findings as follows patient has pedal pulses DP plus one over 4 bilateral PT one over 4 bilateral capillary refill time 3 seconds all digits epicritic and proprioceptive sensations intact and symmetric bilateral there is normal plantar response DTRs not elicited dermatologically skin color pigment normal hair growth absent nails show thickening discoloration and proptosis incurvation particular hallux nails bilateral painful on palpation with enclosed shoe wear and ambulation nails brittle friable discolored patient also has little HAV deformity and rigid digital contractures 2 through 5 associated keratoses HD for left to the digital contracture keratotic lesion is debrided and treated with lumicain Neosporin. No other open wounds ulcerations no secondary infections patient ambulating with the assistance of a walker.       Assessment & Plan:  Assessment this time is onychomycosis painful dystrophic friable criptotic nails 1 through 5 bilateral are debrided at this time also single keratotic lesion fourth toe proximal IP joint left foot is also debrided this time returned for future palliative care is needed maintain accommodative shoes as instructed. Reappointed 3 months  Harriet Masson DPM

## 2013-11-01 ENCOUNTER — Ambulatory Visit: Payer: Medicare Other

## 2013-12-06 ENCOUNTER — Ambulatory Visit (INDEPENDENT_AMBULATORY_CARE_PROVIDER_SITE_OTHER): Payer: Medicare Other

## 2013-12-06 DIAGNOSIS — M2010 Hallux valgus (acquired), unspecified foot: Secondary | ICD-10-CM

## 2013-12-06 DIAGNOSIS — B351 Tinea unguium: Secondary | ICD-10-CM

## 2013-12-06 DIAGNOSIS — M79609 Pain in unspecified limb: Secondary | ICD-10-CM

## 2013-12-06 DIAGNOSIS — Q828 Other specified congenital malformations of skin: Secondary | ICD-10-CM

## 2013-12-06 DIAGNOSIS — M2040 Other hammer toe(s) (acquired), unspecified foot: Secondary | ICD-10-CM

## 2013-12-06 DIAGNOSIS — M79606 Pain in leg, unspecified: Secondary | ICD-10-CM

## 2013-12-06 NOTE — Patient Instructions (Signed)

## 2013-12-06 NOTE — Progress Notes (Signed)
   Subjective:    Patient ID: Hannah Arias, female    DOB: 12/29/1910, 80 y.o.   MRN: [id number]  HPI Comments: Pt states she needs her toenails clipped and it's been a long time between visits.     Review of Systems     Objective:   Physical Exam        Assessment & Plan:

## 2014-03-14 ENCOUNTER — Ambulatory Visit: Payer: Medicare Other

## 2014-03-23 ENCOUNTER — Ambulatory Visit: Payer: Medicare Other | Admitting: Podiatrist

## 2014-04-05 ENCOUNTER — Ambulatory Visit: Payer: Medicare Other | Admitting: Podiatrist

## 2014-06-09 ENCOUNTER — Ambulatory Visit (INDEPENDENT_AMBULATORY_CARE_PROVIDER_SITE_OTHER): Payer: Medicare Other | Admitting: Podiatrist

## 2014-06-09 ENCOUNTER — Encounter: Payer: Self-pay | Admitting: Podiatrist

## 2014-06-09 DIAGNOSIS — B351 Tinea unguium: Secondary | ICD-10-CM

## 2014-06-09 DIAGNOSIS — M79606 Pain in leg, unspecified: Secondary | ICD-10-CM

## 2014-06-09 NOTE — Progress Notes (Signed)
  Subjective:    Patient ID: Hannah Arias, female    DOB: [birth year], 81 y.o.   MRN: [id number]  HPI Comments: '' TOENAILS TRIM''     Objective:   Physical Exam Neurovascular status intact and unchanged with palpable pedal pulses at 1/4 DP and PT bilateral. Neurological sensation intact and unchanged. Patient is significant digital contractures hammertoe deformity HAV deformity as well as osteo- arthropathy the feet bilateral. Nails thick brittle discolored incurvated ingrowing painful and tender with ambulation and on attempted debridement. Painful nails 1 through 4 bilateral. Bilateral hallux nails are significantly ingrown on the corners and no sign of bacterial infection is noted. Corn is noted left fourth toe dorsally     Assessment & Plan:  Onychomycosis with painful ingrowing nails 1 through 4 bilateral. , Corn left fourth toe dorsally  Debridement of symptomatic toenails and corn carried out today without complication she'll be seen back in 3 months for routine follow-up and care.

## 2014-10-05 ENCOUNTER — Ambulatory Visit (INDEPENDENT_AMBULATORY_CARE_PROVIDER_SITE_OTHER): Payer: Medicare Other | Admitting: Podiatry

## 2014-10-05 DIAGNOSIS — B351 Tinea unguium: Secondary | ICD-10-CM

## 2014-10-05 DIAGNOSIS — M79673 Pain in unspecified foot: Secondary | ICD-10-CM | POA: Diagnosis not present

## 2014-10-05 DIAGNOSIS — L851 Acquired keratosis [keratoderma] palmaris et plantaris: Secondary | ICD-10-CM | POA: Diagnosis not present

## 2014-10-05 DIAGNOSIS — M79606 Pain in leg, unspecified: Secondary | ICD-10-CM

## 2014-10-05 NOTE — Progress Notes (Signed)
Patient ID: Hannah Arias, female   DOB: 04/22/1910, 81 y.o.   MRN: [id number] Complaint:  Visit Type: Patient returns to my office for continued preventative foot care services. Complaint: Patient states" my nails have grown long and thick and become painful to walk and wear shoes" . He presents for preventative foot care services. No changes to ROS.  She has painful corns on her fourth toe left and second toe right.  Podiatric Exam: Vascular: dorsalis pedis and posterior tibial pulses are palpable bilateral. Capillary return is immediate. Temperature gradient is WNL. Skin turgor WNL  Sensorium: Normal Semmes Weinstein monofilament test. Normal tactile sensation bilaterally. Nail Exam: Pt has thick disfigured discolored nails with subungual debris noted bilateral entire nail hallux through fourth  toenails Ulcer Exam: There is no evidence of ulcer or pre-ulcerative changes or infection. Orthopedic Exam: Muscle tone and strength are WNL. No limitations in general ROM. No crepitus or effusions noted. Foot type and digits show no abnormalities. Bony prominences are unremarkable. Skin: No Porokeratosis. No infection or ulcers  Diagnosis:  Tinea unguium, Pain in right toe, pain in left toes, Corns B/L  Treatment & Plan Procedures and Treatment: Consent by patient was obtained for treatment procedures. The patient understood the discussion of treatment and procedures well. All questions were answered thoroughly reviewed. Debridement of mycotic and hypertrophic toenails, 1 through 5 bilateral and clearing of subungual debris. No ulceration, no infection noted. Debridement of corns Return Visit-Office Procedure: Patient instructed to return to the office for a follow up visit 3 months for continued evaluation and treatment.

## 2014-10-10 ENCOUNTER — Ambulatory Visit: Payer: Medicaid Other

## 2015-01-18 ENCOUNTER — Ambulatory Visit: Payer: Medicare Other | Admitting: Podiatry

## 2015-01-22 ENCOUNTER — Ambulatory Visit (INDEPENDENT_AMBULATORY_CARE_PROVIDER_SITE_OTHER): Payer: Medicare Other | Admitting: Podiatry

## 2015-01-22 ENCOUNTER — Encounter: Payer: Self-pay | Admitting: Podiatry

## 2015-01-22 VITALS — BP 164/78 | HR 61 | Resp 16

## 2015-01-22 DIAGNOSIS — L84 Corns and callosities: Secondary | ICD-10-CM

## 2015-01-22 DIAGNOSIS — B351 Tinea unguium: Secondary | ICD-10-CM

## 2015-01-22 DIAGNOSIS — M79676 Pain in unspecified toe(s): Secondary | ICD-10-CM

## 2015-01-22 DIAGNOSIS — M2040 Other hammer toe(s) (acquired), unspecified foot: Secondary | ICD-10-CM

## 2015-01-23 NOTE — Progress Notes (Signed)
Patient ID: Hannah Arias, female   DOB: 1910/10/13, 81 y.o.   MRN: [id number]  Subjective: 81 y.o. returns the office today for painful, elongated, thickened toenails which she is unable to trim herself. Denies any redness or drainage around the nails. Denies any acute changes since last appointment and no new complaints today. Denies any systemic complaints such as fevers, chills, nausea, vomiting.   Objective: AAO 3, NAD DP/PT pulses palpable, CRT less than 3 seconds Nails hypertrophic, dystrophic, elongated, brittle, discolored 10. There is tenderness overlying the nails 1-5 bilaterally. There is no surrounding erythema or drainage along the nail sites. Hyperkeratotic lesion distal aspect right second toe. Upon debridement no underlying ulceration, drainage or other signs of infection. No open lesions or other pre-ulcerative lesions are identified. No other areas of tenderness bilateral lower extremities. No overlying edema, erythema, increased warmth. Hammertoe contractures are present. No pain with calf compression, swelling, warmth, erythema.  Assessment: Patient presents with symptomatic onychomycosis, hyperkeratotic lesion  Plan: -Treatment options including alternatives, risks, complications were discussed -Nails sharply debrided 10 without complication/bleeding. -Hyperkeratotic lesion sharply debrided 1 without complication/bleeding. -Discussed daily foot inspection. If there are any changes, to call the office immediately.  -Follow-up in 3 months or sooner if any problems are to arise. In the meantime, encouraged to call the office with any questions, concerns, changes symptoms.  Celesta Gentile, DPM

## 2015-01-24 ENCOUNTER — Ambulatory Visit: Payer: Medicare Other | Admitting: Podiatry

## 2015-04-30 ENCOUNTER — Ambulatory Visit: Payer: Medicare Other | Admitting: Podiatry

## 2015-06-11 ENCOUNTER — Ambulatory Visit (INDEPENDENT_AMBULATORY_CARE_PROVIDER_SITE_OTHER): Payer: Medicare Other | Admitting: Podiatry

## 2015-06-11 ENCOUNTER — Encounter: Payer: Self-pay | Admitting: Podiatry

## 2015-06-11 DIAGNOSIS — M79676 Pain in unspecified toe(s): Secondary | ICD-10-CM | POA: Diagnosis not present

## 2015-06-11 DIAGNOSIS — L84 Corns and callosities: Secondary | ICD-10-CM | POA: Diagnosis not present

## 2015-06-11 DIAGNOSIS — B351 Tinea unguium: Secondary | ICD-10-CM | POA: Diagnosis not present

## 2015-06-11 NOTE — Progress Notes (Signed)
Patient ID: Hannah Arias, female   DOB: 06/09/1910, 82 y.o.   MRN: [id number]  Subjective: 82 y.o. returns the office today for painful, elongated, thickened toenails which she is unable to trim herself. Denies any redness or drainage around the nails. Denies any acute changes since last appointment and no new complaints today. Denies any systemic complaints such as fevers, chills, nausea, vomiting.   Objective: AAO 3, NAD DP/PT pulses palpable, CRT less than 3 seconds Nails hypertrophic, dystrophic, elongated, brittle, discolored 10. There is tenderness overlying the nails 1-5 bilaterally. There is no surrounding erythema or drainage along the nail sites. Hyperkeratotic lesion distal aspect right second toe. Upon debridement no underlying ulceration, drainage or other signs of infection. No open lesions or other pre-ulcerative lesions are identified. No other areas of tenderness bilateral lower extremities. No overlying edema, erythema, increased warmth. Hammertoe contractures are present. No pain with calf compression, swelling, warmth, erythema.  Assessment: Patient presents with symptomatic onychomycosis, hyperkeratotic lesion  Plan: -Treatment options including alternatives, risks, complications were discussed -Nails sharply debrided 10 without complication/bleeding. -Hyperkeratotic lesion sharply debrided 1 without complication/bleeding. -Discussed daily foot inspection. If there are any changes, to call the office immediately.  -Follow-up in 3 months or sooner if any problems are to arise. In the meantime, encouraged to call the office with any questions, concerns, changes symptoms.  Matthew Wagoner, DPM  

## 2015-06-21 ENCOUNTER — Emergency Department (HOSPITAL_COMMUNITY)
Admission: EM | Admit: 2015-06-21 | Discharge: 2015-06-21 | Disposition: A | Discharge status: Home / Self Care | Payer: Medicare Other | Attending: Emergency Medicine | Admitting: Emergency Medicine

## 2015-06-21 ENCOUNTER — Emergency Department (HOSPITAL_COMMUNITY): Payer: Medicare Other

## 2015-06-21 ENCOUNTER — Encounter (HOSPITAL_COMMUNITY): Payer: Self-pay | Admitting: Cardiology

## 2015-06-21 DIAGNOSIS — Y9389 Activity, other specified: Secondary | ICD-10-CM | POA: Diagnosis not present

## 2015-06-21 DIAGNOSIS — Z79899 Other long term (current) drug therapy: Secondary | ICD-10-CM | POA: Insufficient documentation

## 2015-06-21 DIAGNOSIS — S3992XA Unspecified injury of lower back, initial encounter: Secondary | ICD-10-CM | POA: Insufficient documentation

## 2015-06-21 DIAGNOSIS — Y9289 Other specified places as the place of occurrence of the external cause: Secondary | ICD-10-CM | POA: Insufficient documentation

## 2015-06-21 DIAGNOSIS — S0990XA Unspecified injury of head, initial encounter: Secondary | ICD-10-CM | POA: Insufficient documentation

## 2015-06-21 DIAGNOSIS — W010XXA Fall on same level from slipping, tripping and stumbling without subsequent striking against object, initial encounter: Secondary | ICD-10-CM | POA: Diagnosis not present

## 2015-06-21 DIAGNOSIS — K219 Gastro-esophageal reflux disease without esophagitis: Secondary | ICD-10-CM | POA: Insufficient documentation

## 2015-06-21 DIAGNOSIS — Y998 Other external cause status: Secondary | ICD-10-CM | POA: Insufficient documentation

## 2015-06-21 DIAGNOSIS — R531 Weakness: Secondary | ICD-10-CM | POA: Diagnosis not present

## 2015-06-21 DIAGNOSIS — W19XXXA Unspecified fall, initial encounter: Secondary | ICD-10-CM

## 2015-06-21 DIAGNOSIS — Z880 Allergy status to penicillin: Secondary | ICD-10-CM | POA: Diagnosis not present

## 2015-06-21 NOTE — ED Notes (Signed)
Room air sats 89-91%.  Placed on nasal cannula 2 liters oxygen.

## 2015-06-21 NOTE — Discharge Instructions (Signed)
As discussed, your evaluation today has been largely reassuring.  But, it is important that you monitor your condition carefully, and do not hesitate to return to the ED if you develop new, or concerning changes in your condition. ? ?Otherwise, please follow-up with your physician for appropriate ongoing care. ? ?

## 2015-06-21 NOTE — ED Provider Notes (Signed)
CSN: [id number]     Arrival date & time 06/21/15  1436 History   First MD Initiated Contact with Patient 06/21/15 1439     Chief Complaint  Patient presents with  . Fall     (Consider location/radiation/quality/duration/timing/severity/associated sxs/prior Treatment) HPI Patient resents after a fall. Patient seems to have recall of the entirety of the event. She thinks that she fell straight backward, landing on her back, head. She complains of pain focally in the lower back, though this has improved after being provided morphine, by EMS providers. Pain is sore, diffuse throughout the lower back, worse with motion of her legs. Patient denies loss of consciousness, confusion, disorientation, vision changes. She acknowledges difficulty with hearing, unchanged.  Past Medical History  Diagnosis Date  . GERD (gastroesophageal reflux disease)    Past Surgical History  Procedure Laterality Date  . Tonsillectomy and adenoidectomy      11 yr  . Appendectomy      82 yr old  . Tubal removal    . Hemorroidectomy    . Hernia repair    . Shoulder surgery      left rotator cuff  . Cataract extraction    . Elbow surgery    . Breast lumpectomy     Family History  Problem Relation Age of Onset  . Cancer    . Diabetes     Social History  Substance Use Topics  . Smoking status: Never Smoker   . Smokeless tobacco: None  . Alcohol Use: No   OB History    No data available     Review of Systems  Constitutional:       Per HPI, otherwise negative  HENT:       Per HPI, otherwise negative  Respiratory:       Per HPI, otherwise negative  Cardiovascular:       Per HPI, otherwise negative  Gastrointestinal: Negative for vomiting.  Endocrine:       Negative aside from HPI  Genitourinary:       Neg aside from HPI   Musculoskeletal:       Per HPI, otherwise negative  Skin: Negative for wound.  Neurological: Positive for weakness and light-headedness. Negative for syncope.       Allergies  Aspirin; Codeine; and Penicillins  Home Medications   Prior to Admission medications   Medication Sig Start Date End Date Taking? Authorizing Provider  calcium carbonate (OS-CAL - DOSED IN MG OF ELEMENTAL CALCIUM) 1250 MG tablet Take 1 tablet by mouth daily.    Yes Historical Provider, MD  Multiple Vitamin (MULTIVITAMIN WITH MINERALS) TABS tablet Take 1 tablet by mouth daily.   Yes Historical Provider, MD  omeprazole (PRILOSEC) 20 MG capsule Take 20 mg by mouth daily.  07/22/10  Yes Historical Provider, MD  vitamin B-12 500 MCG tablet Take 1 tablet (500 mcg total) by mouth daily. 05/15/13  Yes Hosie Poisson, MD  albuterol (PROVENTIL) (2.5 MG/3ML) 0.083% nebulizer solution Take 3 mLs (2.5 mg total) by nebulization every 4 (four) hours as needed for wheezing or shortness of breath. 05/15/13   Hosie Poisson, MD  benzonatate (TESSALON) 100 MG capsule Take 100 mg by mouth 2 (two) times daily as needed for cough.    Historical Provider, MD  ferrous sulfate 325 (65 FE) MG tablet Take 1 tablet (325 mg total) by mouth 2 (two) times daily with a meal. Patient not taking: Reported on 06/21/2015 05/15/13   Hosie Poisson, MD  senna-docusate (SENOKOT-S) 8.6-50  MG per tablet Take 1 tablet by mouth 2 (two) times daily. 05/15/13   Hosie Poisson, MD   BP 147/68 mmHg  Pulse 85  Temp(Src) 98.4 F (36.9 C) (Oral)  Resp 20  Ht 5\' 3"  (1.6 m)  Wt 160 lb (72.576 kg)  BMI 28.35 kg/m2  SpO2 96% Physical Exam  Constitutional: She is oriented to person, place, and time. She appears well-developed and well-nourished. She has a sickly appearance. No distress.  HENT:  Head: Normocephalic and atraumatic.    Eyes: Conjunctivae and EOM are normal.  Neck:  Patient denies obvious pain, but does not move her neck fully  Cardiovascular: Normal rate and regular rhythm.   Pulmonary/Chest: Effort normal and breath sounds normal. No stridor. No respiratory distress.  Abdominal: She exhibits no distension.   Musculoskeletal: She exhibits no edema.       Legs: Neurological: She is alert and oriented to person, place, and time. No cranial nerve deficit.  Very poor hearing, otherwise unremarkable neurologically  Skin: Skin is warm and dry.  Psychiatric: She has a normal mood and affect.  Nursing note and vitals reviewed.   ED Course  Procedures (including critical care time) Labs Review Labs Reviewed - No data to display  Imaging Review Ct Head Wo Contrast  06/21/2015  CLINICAL DATA:  Fall today, head and neck pain. EXAM: CT HEAD WITHOUT CONTRAST CT CERVICAL SPINE WITHOUT CONTRAST TECHNIQUE: Multidetector CT imaging of the head and cervical spine was performed following the standard protocol without intravenous contrast. Multiplanar CT image reconstructions of the cervical spine were also generated. COMPARISON:  Head CT dated 12/09/2008 and cervical spine CT dated 11/06/2009. FINDINGS: CT HEAD FINDINGS There is mild generalized brain atrophy with commensurate dilatation of the ventricles and sulci. Mild chronic small vessel ischemic change noted within the deep periventricular white matter regions bilaterally. There is no mass, hemorrhage, edema or other evidence of acute parenchymal abnormality. No extra-axial hemorrhage. No osseous fracture or dislocation. Mild soft tissue thickening/edema overlies the left frontal bone. Perhaps mild scalp edema overlying the left occipital bone. No underlying fracture. Chronic-appearing mucosal thickening noted within the left maxillary sinus. Additional chronic-appearing mucosal thickening noted within the sphenoid sinus. CT CERVICAL SPINE FINDINGS Osseous alignment is stable, with stable dextroscoliosis. No fracture line or displaced fracture fragment identified. Facet joints appear well aligned throughout. Mild degenerative change again noted throughout the cervical spine, without evidence of significant central canal stenosis. Atherosclerotic changes again noted at  each carotid bulb region. Bilateral thyroid lobe masses, right greater than left, appear grossly stable, incompletely imaged. Paravertebral soft tissues are otherwise unremarkable. IMPRESSION: 1. Mild soft tissue thickening overlying the left frontal bone, perhaps chronic and related to a previous fall. Questionable mild scalp edema overlying the left occipital bone. No underlying skull fracture in either of these areas. No evidence of acute intracranial abnormality. No intracranial hemorrhage or edema. 2. No fracture or acute subluxation within the cervical spine. Degenerative/chronic findings detailed above. Electronically Signed   By: Franki Cabot M.D.   On: 06/21/2015 16:15   Ct Cervical Spine Wo Contrast  06/21/2015  CLINICAL DATA:  Fall today, head and neck pain. EXAM: CT HEAD WITHOUT CONTRAST CT CERVICAL SPINE WITHOUT CONTRAST TECHNIQUE: Multidetector CT imaging of the head and cervical spine was performed following the standard protocol without intravenous contrast. Multiplanar CT image reconstructions of the cervical spine were also generated. COMPARISON:  Head CT dated 12/09/2008 and cervical spine CT dated 11/06/2009. FINDINGS: CT HEAD FINDINGS There is  mild generalized brain atrophy with commensurate dilatation of the ventricles and sulci. Mild chronic small vessel ischemic change noted within the deep periventricular white matter regions bilaterally. There is no mass, hemorrhage, edema or other evidence of acute parenchymal abnormality. No extra-axial hemorrhage. No osseous fracture or dislocation. Mild soft tissue thickening/edema overlies the left frontal bone. Perhaps mild scalp edema overlying the left occipital bone. No underlying fracture. Chronic-appearing mucosal thickening noted within the left maxillary sinus. Additional chronic-appearing mucosal thickening noted within the sphenoid sinus. CT CERVICAL SPINE FINDINGS Osseous alignment is stable, with stable dextroscoliosis. No fracture line  or displaced fracture fragment identified. Facet joints appear well aligned throughout. Mild degenerative change again noted throughout the cervical spine, without evidence of significant central canal stenosis. Atherosclerotic changes again noted at each carotid bulb region. Bilateral thyroid lobe masses, right greater than left, appear grossly stable, incompletely imaged. Paravertebral soft tissues are otherwise unremarkable. IMPRESSION: 1. Mild soft tissue thickening overlying the left frontal bone, perhaps chronic and related to a previous fall. Questionable mild scalp edema overlying the left occipital bone. No underlying skull fracture in either of these areas. No evidence of acute intracranial abnormality. No intracranial hemorrhage or edema. 2. No fracture or acute subluxation within the cervical spine. Degenerative/chronic findings detailed above. Electronically Signed   By: Franki Cabot M.D.   On: 06/21/2015 16:15   Ct Lumbar Spine Wo Contrast  06/21/2015  CLINICAL DATA:  Acute lower back pain after fall today. EXAM: CT LUMBAR SPINE WITHOUT CONTRAST TECHNIQUE: Multidetector CT imaging of the lumbar spine was performed without intravenous contrast administration. Multiplanar CT image reconstructions were also generated. COMPARISON:  None. FINDINGS: No fracture or significant spondylolisthesis is noted. Multilevel degenerative disc disease is noted throughout the lumbar spine. Moderate levoscoliosis of lumbar spine is noted. Atherosclerosis of abdominal aorta is noted. Diffuse osteopenia is noted. IMPRESSION: Multilevel degenerative disc disease is noted. No acute fracture is noted. Electronically Signed   By: Marijo Conception, M.D.   On: 06/21/2015 16:13   I have personally reviewed and evaluated these images and lab results as part of my medical decision-making.  4:53 PM Patient in no distress on repeat exam. She and family members are aware of all findings. She is aware of home care instructions,   MDM  This elderly F presents following a fall.  Patient is >89.  No evidence for new fracture, no new neurologic deficits. Patient made hemodynamically stable 4 hours in the emergency department. Was largely reassuring findings, the patient was discharged with family members back to her house.    Carmin Muskrat, MD 06/21/15 [phone]

## 2015-06-21 NOTE — ED Notes (Signed)
Pt states her pain is ok.  Pt asked to take off oxygen.  Removed nasal cannula

## 2015-06-21 NOTE — ED Notes (Signed)
Fall today,  States she fell straight back.  C/o lower back,  Sacral pain.  Ems gave 2 mg morphine with relief.

## 2015-09-03 ENCOUNTER — Ambulatory Visit: Payer: Medicare Other | Admitting: Podiatry

## 2015-10-19 ENCOUNTER — Encounter: Payer: Self-pay | Admitting: Podiatry

## 2015-10-19 ENCOUNTER — Ambulatory Visit (INDEPENDENT_AMBULATORY_CARE_PROVIDER_SITE_OTHER): Payer: Medicare Other | Admitting: Podiatry

## 2015-10-19 DIAGNOSIS — M79676 Pain in unspecified toe(s): Secondary | ICD-10-CM

## 2015-10-19 DIAGNOSIS — B351 Tinea unguium: Secondary | ICD-10-CM | POA: Diagnosis not present

## 2015-10-19 DIAGNOSIS — L84 Corns and callosities: Secondary | ICD-10-CM

## 2015-10-20 NOTE — Progress Notes (Signed)
Patient ID: Hannah Arias, female   DOB: Dec 12, 1910, 82 y.o.   MRN: [id number]  Subjective: 82 y.o. returns the office today for painful, elongated, thickened toenails which she is unable to trim herself. Denies any redness or drainage around the nails. Denies any acute changes since last appointment and no new complaints today. Denies any systemic complaints such as fevers, chills, nausea, vomiting.   Objective: AAO 3, NAD DP/PT pulses palpable, CRT less than 3 seconds Nails hypertrophic, dystrophic, elongated, brittle, discolored 10. There is tenderness overlying the nails 1-5 bilaterally. There is no surrounding erythema or drainage along the nail sites. Hyperkeratotic lesion distal aspect right second toe. Upon debridement no underlying ulceration, drainage or other signs of infection. No open lesions or other pre-ulcerative lesions are identified. No other areas of tenderness bilateral lower extremities. No overlying edema, erythema, increased warmth. Hammertoe contractures are present. No pain with calf compression, swelling, warmth, erythema.  Assessment: Patient presents with symptomatic onychomycosis, hyperkeratotic lesion  Plan: -Treatment options including alternatives, risks, complications were discussed -Nails sharply debrided 10 without complication/bleeding. -Hyperkeratotic lesion sharply debrided 1 without complication/bleeding. -Discussed daily foot inspection. If there are any changes, to call the office immediately.  -Follow-up in 3 months or sooner if any problems are to arise. In the meantime, encouraged to call the office with any questions, concerns, changes symptoms.  Celesta Gentile, DPM

## 2016-01-18 ENCOUNTER — Ambulatory Visit: Payer: Medicare Other | Admitting: Podiatry

## 2016-03-07 ENCOUNTER — Ambulatory Visit (INDEPENDENT_AMBULATORY_CARE_PROVIDER_SITE_OTHER): Payer: Medicare Other | Admitting: Podiatry

## 2016-03-07 ENCOUNTER — Encounter: Payer: Self-pay | Admitting: Podiatry

## 2016-03-07 DIAGNOSIS — M79676 Pain in unspecified toe(s): Secondary | ICD-10-CM

## 2016-03-07 DIAGNOSIS — B351 Tinea unguium: Secondary | ICD-10-CM

## 2016-03-07 DIAGNOSIS — L84 Corns and callosities: Secondary | ICD-10-CM | POA: Diagnosis not present

## 2016-03-10 NOTE — Progress Notes (Signed)
Patient ID: STARSHA SCHOENE, female   DOB: 1910/05/29, 82 y.o.   MRN: [id number]  Subjective: 82 y.o. returns the office today for painful, elongated, thickened toenails which she is unable to trim herself. Denies any redness or drainage around the nails. Denies any acute changes since last appointment and no new complaints today. Denies any systemic complaints such as fevers, chills, nausea, vomiting.   Objective: AAO 3, NAD DP/PT pulses palpable, CRT less than 3 seconds Nails hypertrophic, dystrophic, elongated, brittle, discolored 10. There is tenderness overlying the nails 1-5 bilaterally. There is no surrounding erythema or drainage along the nail sites. Hyperkeratotic lesion distal aspect right second toe. Upon debridement no underlying ulceration, drainage or other signs of infection. No open lesions or other pre-ulcerative lesions are identified. No other areas of tenderness bilateral lower extremities. No overlying edema, erythema, increased warmth. Hammertoe contractures are present. No pain with calf compression, swelling, warmth, erythema.  Assessment: Patient presents with symptomatic onychomycosis, hyperkeratotic lesion  Plan: -Treatment options including alternatives, risks, complications were discussed -Nails sharply debrided 10 without complication/bleeding. -Hyperkeratotic lesion sharply debrided 1 without complication/bleeding. -Discussed daily foot inspection. If there are any changes, to call the office immediately.  -Follow-up in 3 months or sooner if any problems are to arise. In the meantime, encouraged to call the office with any questions, concerns, changes symptoms.  Celesta Gentile, DPM

## 2016-06-06 ENCOUNTER — Ambulatory Visit: Payer: Medicare Other | Admitting: Podiatry

## 2016-07-24 DIAGNOSIS — J069 Acute upper respiratory infection, unspecified: Secondary | ICD-10-CM | POA: Diagnosis not present

## 2016-07-24 DIAGNOSIS — J309 Allergic rhinitis, unspecified: Secondary | ICD-10-CM | POA: Diagnosis not present

## 2016-08-01 ENCOUNTER — Encounter: Payer: Self-pay | Admitting: Podiatry

## 2016-08-01 ENCOUNTER — Ambulatory Visit (INDEPENDENT_AMBULATORY_CARE_PROVIDER_SITE_OTHER): Payer: Medicare HMO | Admitting: Podiatry

## 2016-08-01 DIAGNOSIS — L84 Corns and callosities: Secondary | ICD-10-CM

## 2016-08-01 DIAGNOSIS — B351 Tinea unguium: Secondary | ICD-10-CM | POA: Diagnosis not present

## 2016-08-01 DIAGNOSIS — M79676 Pain in unspecified toe(s): Secondary | ICD-10-CM | POA: Diagnosis not present

## 2016-08-01 DIAGNOSIS — M79675 Pain in left toe(s): Secondary | ICD-10-CM | POA: Diagnosis not present

## 2016-08-01 DIAGNOSIS — M79674 Pain in right toe(s): Secondary | ICD-10-CM

## 2016-08-04 NOTE — Progress Notes (Signed)
Patient ID: Hannah Arias, female   DOB: 10/07/1910, 83 y.o.   MRN: [id number]  Subjective: 83 y.o. returns the office today for painful, elongated, thickened toenails which she is unable to trim herself. Denies any redness or drainage around the nails. Denies any acute changes since last appointment and no new complaints today. Denies any systemic complaints such as fevers, chills, nausea, vomiting.   Objective: AAO 3, NAD DP/PT pulses palpable, CRT less than 3 seconds Nails hypertrophic, dystrophic, elongated, brittle, discolored 10. There is tenderness overlying the nails 1-5 bilaterally. There is no surrounding erythema or drainage along the nail sites. Hyperkeratotic lesion distal aspect right second toe. Upon debridement no underlying ulceration, drainage or other signs of infection. No open lesions or other pre-ulcerative lesions are identified. No other areas of tenderness bilateral lower extremities. No overlying edema, erythema, increased warmth. Hammertoe contractures are present. No pain with calf compression, swelling, warmth, erythema.  Assessment: Patient presents with symptomatic onychomycosis, hyperkeratotic lesion  Plan: -Treatment options including alternatives, risks, complications were discussed -Nails sharply debrided 10 without complication/bleeding. -Hyperkeratotic lesion sharply debrided 1 without complication/bleeding. -Discussed daily foot inspection. If there are any changes, to call the office immediately.  -Follow-up in 3 months or sooner if any problems are to arise. In the meantime, encouraged to call the office with any questions, concerns, changes symptoms.  Buel Molder, DPM  

## 2016-10-30 DIAGNOSIS — K219 Gastro-esophageal reflux disease without esophagitis: Secondary | ICD-10-CM | POA: Diagnosis not present

## 2016-10-30 DIAGNOSIS — M20009 Unspecified deformity of unspecified finger(s): Secondary | ICD-10-CM | POA: Diagnosis not present

## 2016-10-30 DIAGNOSIS — Z0000 Encounter for general adult medical examination without abnormal findings: Secondary | ICD-10-CM | POA: Diagnosis not present

## 2016-10-30 DIAGNOSIS — M2550 Pain in unspecified joint: Secondary | ICD-10-CM | POA: Diagnosis not present

## 2016-10-30 DIAGNOSIS — Z972 Presence of dental prosthetic device (complete) (partial): Secondary | ICD-10-CM | POA: Diagnosis not present

## 2016-12-03 DIAGNOSIS — J069 Acute upper respiratory infection, unspecified: Secondary | ICD-10-CM | POA: Diagnosis not present

## 2017-01-09 ENCOUNTER — Ambulatory Visit (INDEPENDENT_AMBULATORY_CARE_PROVIDER_SITE_OTHER): Payer: Medicare HMO | Admitting: Podiatry

## 2017-01-09 ENCOUNTER — Encounter: Payer: Self-pay | Admitting: Podiatry

## 2017-01-09 DIAGNOSIS — L84 Corns and callosities: Secondary | ICD-10-CM | POA: Diagnosis not present

## 2017-01-09 DIAGNOSIS — B351 Tinea unguium: Secondary | ICD-10-CM

## 2017-01-09 DIAGNOSIS — M79676 Pain in unspecified toe(s): Secondary | ICD-10-CM

## 2017-01-13 NOTE — Progress Notes (Signed)
Patient ID: Hannah Arias, female   DOB: 1910-08-04, 83 y.o.   MRN: [id number]  Subjective: 83 y.o. returns the office today for painful, elongated, thickened toenails which she is unable to trim herself. Denies any redness or drainage around the nails. Denies any acute changes since last appointment and no new complaints today. Denies any systemic complaints such as fevers, chills, nausea, vomiting.   Objective: AAO 3, NAD DP/PT pulses palpable, CRT less than 3 seconds Nails hypertrophic, dystrophic, elongated, brittle, discolored 10. There is tenderness overlying the nails 1-5 bilaterally. There is no surrounding erythema or drainage along the nail sites. Hyperkeratotic lesion distal aspect right second toe. Upon debridement no underlying ulceration, drainage or other signs of infection. No open lesions or other pre-ulcerative lesions are identified. No other areas of tenderness bilateral lower extremities. No overlying edema, erythema, increased warmth. Hammertoe contractures are present. No pain with calf compression, swelling, warmth, erythema.  Assessment: Patient presents with symptomatic onychomycosis, hyperkeratotic lesion  Plan: -Treatment options including alternatives, risks, complications were discussed -Nails sharply debrided 10 without complication/bleeding. -Hyperkeratotic lesion sharply debrided 1 without complication/bleeding. -Discussed daily foot inspection. If there are any changes, to call the office immediately.  -Follow-up in 3 months or sooner if any problems are to arise. In the meantime, encouraged to call the office with any questions, concerns, changes symptoms.  Celesta Gentile, DPM

## 2017-01-30 DIAGNOSIS — H6123 Impacted cerumen, bilateral: Secondary | ICD-10-CM | POA: Diagnosis not present

## 2017-01-30 DIAGNOSIS — Z23 Encounter for immunization: Secondary | ICD-10-CM | POA: Diagnosis not present

## 2017-01-30 DIAGNOSIS — Z6826 Body mass index (BMI) 26.0-26.9, adult: Secondary | ICD-10-CM | POA: Diagnosis not present

## 2017-04-22 ENCOUNTER — Encounter (HOSPITAL_COMMUNITY): Payer: Self-pay | Admitting: *Deleted

## 2017-04-22 ENCOUNTER — Other Ambulatory Visit: Payer: Self-pay

## 2017-04-22 ENCOUNTER — Emergency Department (HOSPITAL_COMMUNITY): Payer: Medicare HMO

## 2017-04-22 ENCOUNTER — Emergency Department (HOSPITAL_COMMUNITY)
Admission: EM | Admit: 2017-04-22 | Discharge: 2017-04-22 | Disposition: A | Discharge status: Home / Self Care | Payer: Medicare HMO | Attending: Emergency Medicine | Admitting: Emergency Medicine

## 2017-04-22 DIAGNOSIS — R0789 Other chest pain: Secondary | ICD-10-CM | POA: Diagnosis not present

## 2017-04-22 DIAGNOSIS — R079 Chest pain, unspecified: Secondary | ICD-10-CM | POA: Diagnosis not present

## 2017-04-22 DIAGNOSIS — Z79899 Other long term (current) drug therapy: Secondary | ICD-10-CM | POA: Insufficient documentation

## 2017-04-22 DIAGNOSIS — I509 Heart failure, unspecified: Secondary | ICD-10-CM | POA: Diagnosis not present

## 2017-04-22 DIAGNOSIS — R0781 Pleurodynia: Secondary | ICD-10-CM | POA: Diagnosis not present

## 2017-04-22 LAB — CBC WITH DIFFERENTIAL/PLATELET
BASOS ABS: 0 10*3/uL (ref 0.0–0.1)
BASOS PCT: 1 %
EOS ABS: 0.1 10*3/uL (ref 0.0–0.7)
Eosinophils Relative: 2 %
HCT: 33.8 % — ABNORMAL LOW (ref 36.0–46.0)
Hemoglobin: 11 g/dL — ABNORMAL LOW (ref 12.0–15.0)
Lymphocytes Relative: 27 %
Lymphs Abs: 1.2 10*3/uL (ref 0.7–4.0)
MCH: 31.9 pg (ref 26.0–34.0)
MCHC: 32.5 g/dL (ref 30.0–36.0)
MCV: 98 fL (ref 78.0–100.0)
MONO ABS: 0.5 10*3/uL (ref 0.1–1.0)
MONOS PCT: 10 %
NEUTROS PCT: 60 %
Neutro Abs: 2.6 10*3/uL (ref 1.7–7.7)
Platelets: 141 10*3/uL — ABNORMAL LOW (ref 150–400)
RBC: 3.45 MIL/uL — ABNORMAL LOW (ref 3.87–5.11)
RDW: 13.7 % (ref 11.5–15.5)
WBC: 4.4 10*3/uL (ref 4.0–10.5)

## 2017-04-22 LAB — BASIC METABOLIC PANEL
ANION GAP: 10 (ref 5–15)
BUN: 23 mg/dL — ABNORMAL HIGH (ref 6–20)
CO2: 26 mmol/L (ref 22–32)
CREATININE: 0.72 mg/dL (ref 0.44–1.00)
Calcium: 9 mg/dL (ref 8.9–10.3)
Chloride: 104 mmol/L (ref 101–111)
Glucose, Bld: 94 mg/dL (ref 65–99)
Potassium: 4 mmol/L (ref 3.5–5.1)
SODIUM: 140 mmol/L (ref 135–145)

## 2017-04-22 LAB — TROPONIN I: Troponin I: <0.03 ng/mL (ref <0.03)

## 2017-04-22 NOTE — ED Provider Notes (Addendum)
[facility] Provider Note   CSN: [id number] Arrival date & time: 04/22/17  9242     History   Chief Complaint Chief Complaint  Patient presents with  . Chest Pain    HPI Hannah Arias is a 84 y.o. female.  Complains of left anterior "rib pain" onset "several hours ago" she denies any shortness of breath denies nausea denies sweatiness.  Nothing makes symptoms better or worse.  Discomfort is minimal at present.  Constant.  Nothing makes symptoms better or worse.  No other associated symptoms.  No treatment prior to coming here.  Discomfort is constant, nonradiating.  HPI  Past Medical History:  Diagnosis Date  . GERD (gastroesophageal reflux disease)     Patient Active Problem List   Diagnosis Date Noted  . Hypoxia 05/11/2013  . CHF (congestive heart failure) ([place]) 05/11/2013  . CTS (carpal tunnel syndrome) 03/04/2011  . CARPAL TUNNEL SYNDROME, RIGHT 03/19/2010  . RUPTURE ROTATOR CUFF 03/19/2010  . CLOSED FRACTURE OF OLECRANON PROCESS OF ULNA 11/07/2009    Past Surgical History:  Procedure Laterality Date  . APPENDECTOMY     84 yr old  . BREAST LUMPECTOMY    . CATARACT EXTRACTION    . ELBOW SURGERY    . HEMORROIDECTOMY    . HERNIA REPAIR    . SHOULDER SURGERY     left rotator cuff  . TONSILLECTOMY AND ADENOIDECTOMY     9 yr  . tubal removal      OB History    No data available       Home Medications    Prior to Admission medications   Medication Sig Start Date End Date Taking? Authorizing Provider  albuterol (PROVENTIL) (2.5 MG/3ML) 0.083% nebulizer solution Take 3 mLs (2.5 mg total) by nebulization every 4 (four) hours as needed for wheezing or shortness of breath. 05/15/13   Hosie Poisson, MD  benzonatate (TESSALON) 100 MG capsule Take 100 mg by mouth 2 (two) times daily as needed for cough.    [provider]  calcium carbonate (OS-CAL - DOSED IN MG OF ELEMENTAL CALCIUM) 1250 MG tablet Take 1 tablet by mouth daily.      [provider]  ferrous sulfate 325 (65 FE) MG tablet Take 1 tablet (325 mg total) by mouth 2 (two) times daily with a meal. 05/15/13   Hosie Poisson, MD  Multiple Vitamin (MULTIVITAMIN WITH MINERALS) TABS tablet Take 1 tablet by mouth daily.    [provider]  omeprazole (PRILOSEC) 20 MG capsule Take 20 mg by mouth daily.  07/22/10   [provider]  senna-docusate (SENOKOT-S) 8.6-50 MG per tablet Take 1 tablet by mouth 2 (two) times daily. 05/15/13   Hosie Poisson, MD  vitamin B-12 500 MCG tablet Take 1 tablet (500 mcg total) by mouth daily. 05/15/13   Hosie Poisson, MD    Family History Family History  Problem Relation Age of Onset  . Cancer Unknown   . Diabetes Unknown     Social History Social History   Tobacco Use  . Smoking status: Never Smoker  . Smokeless tobacco: Never Used  Substance Use Topics  . Alcohol use: No  . Drug use: Not on file     Allergies   Aspirin; Codeine; and Penicillins   Review of Systems Review of Systems  Constitutional: Negative.   HENT: Negative.   Respiratory: Negative.   Cardiovascular: Positive for chest pain.  Gastrointestinal: Negative.   Musculoskeletal: Positive for gait problem.  Walks with walker  Skin: Negative.   Psychiatric/Behavioral: Negative.   All other systems reviewed and are negative.    Physical Exam Updated Vital Signs BP (!) 191/82   Pulse 64   Temp 98.1 F (36.7 C) (Oral)   Resp 16   Ht 5\' 3"  (1.6 m)   Wt 68 kg (150 lb)   SpO2 95%   BMI 26.57 kg/m   Physical Exam  Constitutional: She appears well-developed and well-nourished. No distress.  HENT:  Head: Normocephalic and atraumatic.  Eyes: Conjunctivae are normal. Pupils are equal, round, and reactive to light.  Neck: Neck supple. No tracheal deviation present. No thyromegaly present.  Cardiovascular: Normal rate and regular rhythm.  No murmur heard. Pulmonary/Chest: Effort normal and breath sounds normal. She  exhibits no tenderness.  Abdominal: Soft. Bowel sounds are normal. She exhibits no distension. There is no tenderness.  Musculoskeletal: Normal range of motion. She exhibits no edema or tenderness.  Neurological: She is alert. No cranial nerve deficit. Coordination normal.  Skin: Skin is warm and dry. No rash noted.  Psychiatric: She has a normal mood and affect.  Nursing note and vitals reviewed.    ED Treatments / Results  Labs (all labs ordered are listed, but only abnormal results are displayed) Labs Reviewed  CBC WITH DIFFERENTIAL/PLATELET  BASIC METABOLIC PANEL  TROPONIN I  TROPONIN I    EKG  EKG Interpretation  Date/Time:  Wednesday April 22 2017 06:45:13 EST Ventricular Rate:  65 PR Interval:    QRS Duration: 101 QT Interval:  398 QTC Calculation: 414 R Axis:   61 Text Interpretation:  Sinus or ectopic atrial rhythm Abnormal R-wave progression, early transition No significant change since last tracing Confirmed by Orlie Dakin [phone]) on 04/22/2017 7:01:50 AM      Chest x-ray  Results for orders placed or performed during the hospital encounter of 04/22/17  CBC with Differential/Platelet  Result Value Ref Range   WBC 4.4 4.0 - 10.5 K/uL   RBC 3.45 (L) 3.87 - 5.11 MIL/uL   Hemoglobin 11.0 (L) 12.0 - 15.0 g/dL   HCT 33.8 (L) 36.0 - 46.0 %   MCV 98.0 78.0 - 100.0 fL   MCH 31.9 26.0 - 34.0 pg   MCHC 32.5 30.0 - 36.0 g/dL   RDW 13.7 11.5 - 15.5 %   Platelets 141 (L) 150 - 400 K/uL   Neutrophils Relative % 60 %   Neutro Abs 2.6 1.7 - 7.7 K/uL   Lymphocytes Relative 27 %   Lymphs Abs 1.2 0.7 - 4.0 K/uL   Monocytes Relative 10 %   Monocytes Absolute 0.5 0.1 - 1.0 K/uL   Eosinophils Relative 2 %   Eosinophils Absolute 0.1 0.0 - 0.7 K/uL   Basophils Relative 1 %   Basophils Absolute 0.0 0.0 - 0.1 K/uL  Basic metabolic panel  Result Value Ref Range   Sodium 140 135 - 145 mmol/L   Potassium 4.0 3.5 - 5.1 mmol/L   Chloride 104 101 - 111 mmol/L   CO2 26 22 -  32 mmol/L   Glucose, Bld 94 65 - 99 mg/dL   BUN 23 (H) 6 - 20 mg/dL   Creatinine, Ser 0.72 0.44 - 1.00 mg/dL   Calcium 9.0 8.9 - 10.3 mg/dL   GFR calc non Af Amer >60 >60 mL/min   GFR calc Af Amer >60 >60 mL/min   Anion gap 10 5 - 15  Troponin I  Result Value Ref Range   Troponin I <  0.03 <0.03 ng/mL  Troponin I  Result Value Ref Range   Troponin I <0.03 <0.03 ng/mL   Dg Chest 2 View  Result Date: 04/22/2017 CLINICAL DATA:  Left-sided chest pain EXAM: CHEST  2 VIEW COMPARISON:  05/13/2013 chest x-ray, 06/21/2015 CT of the cervical spine FINDINGS: Cardiac shadow is enlarged. Aortic calcifications and tortuosity are noted. Soft tissue density is noted to the right of the midline overlying the medial aspect of the right apex. Previous CT suggests this represents tortuous vascularity is well as a prominent right lobe of the thyroid. The overall density is more prominent than that seen on the prior exam. Lungs are otherwise clear. Chronic changes in the shoulder joints are noted bilaterally. No acute bony abnormality is seen. IMPRESSION: More prominent soft tissue density in the anterior mediastinum extending over the right lung apex. Previous CT suggests this is related to vascularity and prominent right lobe of the thyroid. Nonemergent CT may be helpful for further evaluation. No acute bony abnormality is noted to correspond with patient's clinical symptomatology. Electronically Signed   By: Inez Catalina M.D.   On: 04/22/2017 07:13  viewed by me Radiology No results found.  Procedures Procedures (including critical care time)  Medications Ordered in ED Medications - No data to display  Chest x-ray viewed by me Initial Impression / Assessment and Plan / ED Course  I have reviewed the triage vital signs and the nursing notes.  Pertinent labs & imaging results that were available during my care of the patient were reviewed by me and considered in my medical decision making (see chart for  details).     8:50 AM resting comfortably.  Asymptomatic I do not feel the patient warrants further imaging of her chest as she is 84 years old and healthy.  11:40 AM remains asymptomatic.  Denies any dyspnea.  Lungs clear to auscultation. Plan follow-up with Dr. Nevada Crane.  Heart score equals 2 Final Clinical Impressions(s) / ED Diagnoses  Diagnosis atypical chest pain Final diagnoses:  None    ED Discharge Orders    None       Orlie Dakin, MD 04/22/17 [place], MD 04/22/17 1556

## 2017-04-22 NOTE — ED Triage Notes (Signed)
Pt c/o left side rib pain that started last night; pt states she was sleeping and the pain woke her up; pt states she has some sob

## 2017-04-22 NOTE — Discharge Instructions (Signed)
Call Dr. Nevada Crane today to schedule an office visit.  Tell office staff that you were seen in the emergency department

## 2017-06-24 ENCOUNTER — Encounter: Payer: Self-pay | Admitting: Podiatry

## 2017-06-24 ENCOUNTER — Ambulatory Visit (INDEPENDENT_AMBULATORY_CARE_PROVIDER_SITE_OTHER): Payer: Medicare HMO | Admitting: Podiatry

## 2017-06-24 DIAGNOSIS — M79676 Pain in unspecified toe(s): Secondary | ICD-10-CM | POA: Diagnosis not present

## 2017-06-24 DIAGNOSIS — B351 Tinea unguium: Secondary | ICD-10-CM | POA: Diagnosis not present

## 2017-06-24 DIAGNOSIS — Q828 Other specified congenital malformations of skin: Secondary | ICD-10-CM | POA: Diagnosis not present

## 2017-06-24 NOTE — Progress Notes (Signed)
Patient ID: SRI CLEGG, female   DOB: May 29, 1910, 84 y.o.   MRN: [id number]  Subjective: 84 y.o. returns the office today for painful, elongated, thickened toenails which she is unable to trim herself. Denies any redness or drainage around the nails. Also has a callus on the right 3rd toe that is painful. Denies any acute changes since last appointment and no new complaints today. Denies any systemic complaints such as fevers, chills, nausea, vomiting.   Objective: AAO 3, NAD DP/PT pulses palpable, CRT less than 3 seconds Nails hypertrophic, dystrophic, elongated, brittle, discolored 10. There is tenderness overlying the nails 1-5 bilaterally. There is no surrounding erythema or drainage along the nail sites. Hyperkeratotic lesion distal aspect right 3rd toe. Upon debridement no underlying ulceration, drainage or other signs of infection. No open lesions or other pre-ulcerative lesions are identified. No other areas of tenderness bilateral lower extremities. No overlying edema, erythema, increased warmth. Hammertoe contractures are present. No pain with calf compression, swelling, warmth, erythema.  Assessment: Patient presents with symptomatic onychomycosis, hyperkeratotic lesion  Plan: -Treatment options including alternatives, risks, complications were discussed -Nails sharply debrided 10 without complication/bleeding. -Hyperkeratotic lesion sharply debrided 1 without complication/bleeding. -Discussed daily foot inspection. If there are any changes, to call the office immediately.  -Follow-up in 3 months or sooner if any problems are to arise. In the meantime, encouraged to call the office with any questions, concerns, changes symptoms.  Celesta Gentile, DPM

## 2017-08-31 DIAGNOSIS — J060 Acute laryngopharyngitis: Secondary | ICD-10-CM | POA: Diagnosis not present

## 2017-10-23 DIAGNOSIS — Z8249 Family history of ischemic heart disease and other diseases of the circulatory system: Secondary | ICD-10-CM | POA: Diagnosis not present

## 2017-10-23 DIAGNOSIS — Z880 Allergy status to penicillin: Secondary | ICD-10-CM | POA: Diagnosis not present

## 2017-10-23 DIAGNOSIS — Z823 Family history of stroke: Secondary | ICD-10-CM | POA: Diagnosis not present

## 2017-10-23 DIAGNOSIS — Z833 Family history of diabetes mellitus: Secondary | ICD-10-CM | POA: Diagnosis not present

## 2017-10-23 DIAGNOSIS — J309 Allergic rhinitis, unspecified: Secondary | ICD-10-CM | POA: Diagnosis not present

## 2017-10-23 DIAGNOSIS — K08109 Complete loss of teeth, unspecified cause, unspecified class: Secondary | ICD-10-CM | POA: Diagnosis not present

## 2017-10-23 DIAGNOSIS — K219 Gastro-esophageal reflux disease without esophagitis: Secondary | ICD-10-CM | POA: Diagnosis not present

## 2017-10-23 DIAGNOSIS — Z853 Personal history of malignant neoplasm of breast: Secondary | ICD-10-CM | POA: Diagnosis not present

## 2017-12-08 DIAGNOSIS — Z6827 Body mass index (BMI) 27.0-27.9, adult: Secondary | ICD-10-CM | POA: Diagnosis not present

## 2017-12-08 DIAGNOSIS — J060 Acute laryngopharyngitis: Secondary | ICD-10-CM | POA: Diagnosis not present

## 2017-12-08 DIAGNOSIS — R300 Dysuria: Secondary | ICD-10-CM | POA: Diagnosis not present

## 2018-01-07 DIAGNOSIS — H9193 Unspecified hearing loss, bilateral: Secondary | ICD-10-CM | POA: Diagnosis not present

## 2018-01-07 DIAGNOSIS — H6123 Impacted cerumen, bilateral: Secondary | ICD-10-CM | POA: Diagnosis not present

## 2018-01-07 DIAGNOSIS — Z6827 Body mass index (BMI) 27.0-27.9, adult: Secondary | ICD-10-CM | POA: Diagnosis not present

## 2018-01-07 DIAGNOSIS — R600 Localized edema: Secondary | ICD-10-CM | POA: Diagnosis not present

## 2018-01-21 DIAGNOSIS — R69 Illness, unspecified: Secondary | ICD-10-CM | POA: Diagnosis not present

## 2018-02-16 ENCOUNTER — Ambulatory Visit (INDEPENDENT_AMBULATORY_CARE_PROVIDER_SITE_OTHER): Payer: Medicare HMO | Admitting: Podiatry

## 2018-02-16 ENCOUNTER — Encounter: Payer: Self-pay | Admitting: Podiatry

## 2018-02-16 DIAGNOSIS — M79676 Pain in unspecified toe(s): Secondary | ICD-10-CM

## 2018-02-16 DIAGNOSIS — B351 Tinea unguium: Secondary | ICD-10-CM | POA: Diagnosis not present

## 2018-02-16 NOTE — Progress Notes (Signed)
Patient ID: Hannah Arias, female   DOB: 1910/09/20, 84 y.o.   MRN: [id number]  Subjective: 84 y.o. returns the office today for painful, elongated, thickened toenails which she is unable to trim herself. Denies any redness or drainage around the nails.  Denies any acute changes since last appointment and no new complaints today. Denies any systemic complaints such as fevers, chills, nausea, vomiting.   Objective: AAO 3, NAD DP/PT pulses palpable, CRT less than 3 seconds Nails hypertrophic, dystrophic, elongated, brittle, discolored 10. There is tenderness overlying the nails 1-5 bilaterally. There is no surrounding erythema or drainage along the nail sites. Hyperkeratotic lesion distal aspect right 3rd toe. Upon debridement no underlying ulceration, drainage or other signs of infection. No open lesions or other pre-ulcerative lesions are identified. No pain with calf compression, swelling, warmth, erythema.  Assessment: Patient presents with symptomatic onychomycosis  Plan: -Treatment options including alternatives, risks, complications were discussed -Nails sharply debrided 10 without complication/bleeding. -Discussed daily foot inspection. If there are any changes, to call the office immediately.  -Follow-up in 3 months or sooner if any problems are to arise. In the meantime, encouraged to call the office with any questions, concerns, changes symptoms.  Celesta Gentile, DPM

## 2018-03-31 ENCOUNTER — Ambulatory Visit (INDEPENDENT_AMBULATORY_CARE_PROVIDER_SITE_OTHER): Payer: Medicare HMO

## 2018-03-31 ENCOUNTER — Encounter: Payer: Self-pay | Admitting: Orthopedic Surgery

## 2018-03-31 ENCOUNTER — Ambulatory Visit (INDEPENDENT_AMBULATORY_CARE_PROVIDER_SITE_OTHER): Payer: Medicare HMO | Admitting: Orthopedic Surgery

## 2018-03-31 VITALS — BP 137/67 | HR 73 | Ht 63.0 in | Wt 159.0 lb

## 2018-03-31 DIAGNOSIS — M25512 Pain in left shoulder: Secondary | ICD-10-CM

## 2018-03-31 DIAGNOSIS — M25511 Pain in right shoulder: Secondary | ICD-10-CM

## 2018-03-31 DIAGNOSIS — G8929 Other chronic pain: Secondary | ICD-10-CM | POA: Diagnosis not present

## 2018-03-31 DIAGNOSIS — M12811 Other specific arthropathies, not elsewhere classified, right shoulder: Secondary | ICD-10-CM | POA: Diagnosis not present

## 2018-03-31 DIAGNOSIS — M12812 Other specific arthropathies, not elsewhere classified, left shoulder: Secondary | ICD-10-CM | POA: Diagnosis not present

## 2018-03-31 NOTE — Patient Instructions (Signed)
Suezanne Jacquet gay as needed

## 2018-03-31 NOTE — Progress Notes (Signed)
NEW PATIENT OFFICE VISIT  Chief Complaint  Patient presents with  . Shoulder Pain    Bilateral arm and shoulder pain.  . Arm Pain    84 years old white female status post left rotator cuff tear many years ago with repair presents with bilateral shoulder pain for evaluation and treatment.  Pain located in the deltoid region of both shoulders duration greater than a year quality dull ache severity moderate associated with decreased range of motion weakness unrelieved by Tylenol and moist heat   Review of Systems  Musculoskeletal: Positive for joint pain and myalgias.  Neurological: Negative for tingling.     Past Medical History:  Diagnosis Date  . GERD (gastroesophageal reflux disease)     Past Surgical History:  Procedure Laterality Date  . APPENDECTOMY     84 yr old  . BREAST LUMPECTOMY    . CATARACT EXTRACTION    . ELBOW SURGERY    . HEMORROIDECTOMY    . HERNIA REPAIR    . SHOULDER SURGERY     left rotator cuff  . TONSILLECTOMY AND ADENOIDECTOMY     9 yr  . tubal removal      Family History  Problem Relation Age of Onset  . Cancer Unknown   . Diabetes Unknown    Social History   Tobacco Use  . Smoking status: Never Smoker  . Smokeless tobacco: Never Used  Substance Use Topics  . Alcohol use: No  . Drug use: Not on file    Allergies  Allergen Reactions  . Aspirin     Stomach problem  . Codeine     hallucinate  . Penicillins Rash    Has patient had a PCN reaction causing immediate rash, facial/tongue/throat swelling, SOB or lightheadedness with hypotension: Yes Has patient had a PCN reaction causing severe rash involving mucus membranes or skin necrosis: No Has patient had a PCN reaction that required hospitalization No Has patient had a PCN reaction occurring within the last 10 years: No If all of the above answers are "NO", then may proceed with Cephalosporin use.     Current Meds  Medication Sig  . acetaminophen (TYLENOL) 500 MG tablet Take  1,000 mg by mouth every 6 (six) hours as needed for moderate pain.  . calcium carbonate (OS-CAL - DOSED IN MG OF ELEMENTAL CALCIUM) 1250 MG tablet Take 1 tablet by mouth daily.   . Multiple Vitamin (MULTIVITAMIN WITH MINERALS) TABS tablet Take 1 tablet by mouth daily.  Marland Kitchen omeprazole (PRILOSEC) 20 MG capsule Take 20 mg by mouth daily.     BP 137/67   Pulse 73   Ht 5\' 3"  (1.6 m)   Wt 159 lb (72.1 kg)   BMI 28.17 kg/m   Physical Exam  Constitutional: She is oriented to person, place, and time. She appears well-developed and well-nourished.  Neurological: She is alert and oriented to person, place, and time.  Psychiatric: She has a normal mood and affect. Judgment normal.  Vitals reviewed.   Right Shoulder Exam   Range of Motion  Active abduction: abnormal  Passive abduction: abnormal  Extension: abnormal  External rotation: abnormal  Forward flexion: abnormal   Muscle Strength  Abduction: 3/5  Internal rotation: 4/5  External rotation: 4/5  Supraspinatus: 3/5  Subscapularis: 4/5   Tests  Apprehension: negative Drop arm: positive  Other  Erythema: absent Sensation: normal Pulse: present  Comments:  Tenderness around and about the acromion   Left Shoulder Exam   Range of Motion  Active abduction: abnormal  Passive abduction: abnormal  Extension: abnormal  External rotation: abnormal  Forward flexion: abnormal   Muscle Strength  Abduction: 3/5  Internal rotation: 4/5  External rotation: 4/5  Supraspinatus: 3/5  Subscapularis: 4/5   Tests  Apprehension: negative Drop arm: positive  Other  Erythema: absent Sensation: normal Pulse: present   Comments:  Also tender around the acromion and deltoid        MEDICAL DECISION SECTION  Xrays were done at [facility]  My independent reading of xrays:  Left shoulder right shoulder 2 views each see report rotator cuff tear induced arthritis with proximal migration of  humerus and articulation of the acromion with the humeral head  Encounter Diagnoses  Name Primary?  . Chronic pain of both shoulders   . Rotator cuff arthropathy of both shoulders Yes    PLAN: (Rx., injectx, surgery, frx, mri/ct) Patient not a surgical candidate at 84 years old Recommend bilateral subacromial injection Continue Tylenol use BenGay Follow-up as needed  Procedure note the subacromial injection shoulder RIGHT  Verbal consent was obtained to inject the  RIGHT   Shoulder  Timeout was completed to confirm the injection site is a subacromial space of the  RIGHT  shoulder   Medication used Depo-Medrol 40 mg and lidocaine 1% 3 cc  Anesthesia was provided by ethyl chloride  The injection was performed in the RIGHT  posterior subacromial space. After pinning the skin with alcohol and anesthetized the skin with ethyl chloride the subacromial space was injected using a 20-gauge needle. There were no complications  Sterile dressing was applied.    Procedure note the subacromial injection shoulder left   Verbal consent was obtained to inject the  Left   Shoulder  Timeout was completed to confirm the injection site is a subacromial space of the  left  shoulder  Medication used Depo-Medrol 40 mg and lidocaine 1% 3 cc  Anesthesia was provided by ethyl chloride  The injection was performed in the left  posterior subacromial space. After pinning the skin with alcohol and anesthetized the skin with ethyl chloride the subacromial space was injected using a 20-gauge needle. There were no complications  Sterile dressing was applied.   No orders of the defined types were placed in this encounter.   Arther Abbott, MD  03/31/2018 10:53 AM

## 2018-04-29 DIAGNOSIS — R600 Localized edema: Secondary | ICD-10-CM | POA: Diagnosis not present

## 2018-04-30 ENCOUNTER — Other Ambulatory Visit: Payer: Self-pay

## 2018-04-30 ENCOUNTER — Encounter (HOSPITAL_COMMUNITY): Payer: Self-pay | Admitting: Pharmacy Technician

## 2018-04-30 ENCOUNTER — Emergency Department (HOSPITAL_COMMUNITY): Payer: Medicare HMO

## 2018-04-30 ENCOUNTER — Inpatient Hospital Stay (HOSPITAL_COMMUNITY)
Admission: EM | Admit: 2018-04-30 | Discharge: 2018-05-07 | DRG: 480 | Disposition: A | Discharge status: Skilled Nursing Facility | Payer: Medicare HMO | Attending: Internal Medicine | Admitting: Internal Medicine

## 2018-04-30 DIAGNOSIS — K5909 Other constipation: Secondary | ICD-10-CM

## 2018-04-30 DIAGNOSIS — K219 Gastro-esophageal reflux disease without esophagitis: Secondary | ICD-10-CM | POA: Diagnosis present

## 2018-04-30 DIAGNOSIS — D649 Anemia, unspecified: Secondary | ICD-10-CM | POA: Diagnosis not present

## 2018-04-30 DIAGNOSIS — Z880 Allergy status to penicillin: Secondary | ICD-10-CM

## 2018-04-30 DIAGNOSIS — N183 Chronic kidney disease, stage 3 unspecified: Secondary | ICD-10-CM

## 2018-04-30 DIAGNOSIS — S72009A Fracture of unspecified part of neck of unspecified femur, initial encounter for closed fracture: Secondary | ICD-10-CM

## 2018-04-30 DIAGNOSIS — I130 Hypertensive heart and chronic kidney disease with heart failure and stage 1 through stage 4 chronic kidney disease, or unspecified chronic kidney disease: Secondary | ICD-10-CM | POA: Diagnosis not present

## 2018-04-30 DIAGNOSIS — D696 Thrombocytopenia, unspecified: Secondary | ICD-10-CM | POA: Diagnosis not present

## 2018-04-30 DIAGNOSIS — E079 Disorder of thyroid, unspecified: Secondary | ICD-10-CM | POA: Diagnosis present

## 2018-04-30 DIAGNOSIS — I509 Heart failure, unspecified: Secondary | ICD-10-CM | POA: Diagnosis not present

## 2018-04-30 DIAGNOSIS — Z886 Allergy status to analgesic agent status: Secondary | ICD-10-CM

## 2018-04-30 DIAGNOSIS — I5032 Chronic diastolic (congestive) heart failure: Secondary | ICD-10-CM | POA: Diagnosis not present

## 2018-04-30 DIAGNOSIS — K439 Ventral hernia without obstruction or gangrene: Secondary | ICD-10-CM | POA: Diagnosis present

## 2018-04-30 DIAGNOSIS — S72102A Unspecified trochanteric fracture of left femur, initial encounter for closed fracture: Secondary | ICD-10-CM | POA: Diagnosis not present

## 2018-04-30 DIAGNOSIS — Y92009 Unspecified place in unspecified non-institutional (private) residence as the place of occurrence of the external cause: Secondary | ICD-10-CM

## 2018-04-30 DIAGNOSIS — Z66 Do not resuscitate: Secondary | ICD-10-CM | POA: Diagnosis present

## 2018-04-30 DIAGNOSIS — Z833 Family history of diabetes mellitus: Secondary | ICD-10-CM | POA: Diagnosis not present

## 2018-04-30 DIAGNOSIS — M79605 Pain in left leg: Secondary | ICD-10-CM | POA: Diagnosis not present

## 2018-04-30 DIAGNOSIS — J9601 Acute respiratory failure with hypoxia: Secondary | ICD-10-CM | POA: Diagnosis not present

## 2018-04-30 DIAGNOSIS — E041 Nontoxic single thyroid nodule: Secondary | ICD-10-CM | POA: Diagnosis not present

## 2018-04-30 DIAGNOSIS — S8992XA Unspecified injury of left lower leg, initial encounter: Secondary | ICD-10-CM | POA: Diagnosis not present

## 2018-04-30 DIAGNOSIS — W19XXXA Unspecified fall, initial encounter: Secondary | ICD-10-CM

## 2018-04-30 DIAGNOSIS — K5900 Constipation, unspecified: Secondary | ICD-10-CM | POA: Diagnosis not present

## 2018-04-30 DIAGNOSIS — K469 Unspecified abdominal hernia without obstruction or gangrene: Secondary | ICD-10-CM

## 2018-04-30 DIAGNOSIS — D62 Acute posthemorrhagic anemia: Secondary | ICD-10-CM

## 2018-04-30 DIAGNOSIS — Z885 Allergy status to narcotic agent status: Secondary | ICD-10-CM

## 2018-04-30 DIAGNOSIS — Z7401 Bed confinement status: Secondary | ICD-10-CM | POA: Diagnosis not present

## 2018-04-30 DIAGNOSIS — S99912A Unspecified injury of left ankle, initial encounter: Secondary | ICD-10-CM | POA: Diagnosis not present

## 2018-04-30 DIAGNOSIS — Z79899 Other long term (current) drug therapy: Secondary | ICD-10-CM

## 2018-04-30 DIAGNOSIS — R0689 Other abnormalities of breathing: Secondary | ICD-10-CM | POA: Diagnosis not present

## 2018-04-30 DIAGNOSIS — R0902 Hypoxemia: Secondary | ICD-10-CM | POA: Diagnosis not present

## 2018-04-30 DIAGNOSIS — D259 Leiomyoma of uterus, unspecified: Secondary | ICD-10-CM | POA: Diagnosis present

## 2018-04-30 DIAGNOSIS — S72002A Fracture of unspecified part of neck of left femur, initial encounter for closed fracture: Secondary | ICD-10-CM

## 2018-04-30 DIAGNOSIS — S199XXA Unspecified injury of neck, initial encounter: Secondary | ICD-10-CM | POA: Diagnosis not present

## 2018-04-30 DIAGNOSIS — R262 Difficulty in walking, not elsewhere classified: Secondary | ICD-10-CM | POA: Diagnosis not present

## 2018-04-30 DIAGNOSIS — S3991XA Unspecified injury of abdomen, initial encounter: Secondary | ICD-10-CM | POA: Diagnosis not present

## 2018-04-30 DIAGNOSIS — M47896 Other spondylosis, lumbar region: Secondary | ICD-10-CM | POA: Diagnosis not present

## 2018-04-30 DIAGNOSIS — I10 Essential (primary) hypertension: Secondary | ICD-10-CM | POA: Diagnosis not present

## 2018-04-30 DIAGNOSIS — K5641 Fecal impaction: Secondary | ICD-10-CM | POA: Diagnosis present

## 2018-04-30 DIAGNOSIS — W010XXA Fall on same level from slipping, tripping and stumbling without subsequent striking against object, initial encounter: Secondary | ICD-10-CM | POA: Diagnosis present

## 2018-04-30 DIAGNOSIS — S299XXA Unspecified injury of thorax, initial encounter: Secondary | ICD-10-CM | POA: Diagnosis not present

## 2018-04-30 DIAGNOSIS — S3992XA Unspecified injury of lower back, initial encounter: Secondary | ICD-10-CM | POA: Diagnosis not present

## 2018-04-30 DIAGNOSIS — N390 Urinary tract infection, site not specified: Secondary | ICD-10-CM | POA: Diagnosis not present

## 2018-04-30 DIAGNOSIS — R0600 Dyspnea, unspecified: Secondary | ICD-10-CM | POA: Diagnosis not present

## 2018-04-30 DIAGNOSIS — S72142A Displaced intertrochanteric fracture of left femur, initial encounter for closed fracture: Secondary | ICD-10-CM | POA: Diagnosis not present

## 2018-04-30 DIAGNOSIS — Z4789 Encounter for other orthopedic aftercare: Secondary | ICD-10-CM | POA: Diagnosis not present

## 2018-04-30 DIAGNOSIS — J9811 Atelectasis: Secondary | ICD-10-CM | POA: Diagnosis not present

## 2018-04-30 DIAGNOSIS — S72142D Displaced intertrochanteric fracture of left femur, subsequent encounter for closed fracture with routine healing: Secondary | ICD-10-CM | POA: Diagnosis not present

## 2018-04-30 DIAGNOSIS — M6281 Muscle weakness (generalized): Secondary | ICD-10-CM | POA: Diagnosis not present

## 2018-04-30 DIAGNOSIS — N179 Acute kidney failure, unspecified: Secondary | ICD-10-CM | POA: Diagnosis not present

## 2018-04-30 DIAGNOSIS — M25552 Pain in left hip: Secondary | ICD-10-CM | POA: Diagnosis present

## 2018-04-30 DIAGNOSIS — Z8781 Personal history of (healed) traumatic fracture: Secondary | ICD-10-CM | POA: Diagnosis not present

## 2018-04-30 DIAGNOSIS — S72001A Fracture of unspecified part of neck of right femur, initial encounter for closed fracture: Secondary | ICD-10-CM | POA: Diagnosis not present

## 2018-04-30 DIAGNOSIS — M2550 Pain in unspecified joint: Secondary | ICD-10-CM | POA: Diagnosis not present

## 2018-04-30 DIAGNOSIS — R1312 Dysphagia, oropharyngeal phase: Secondary | ICD-10-CM | POA: Diagnosis not present

## 2018-04-30 DIAGNOSIS — R52 Pain, unspecified: Secondary | ICD-10-CM | POA: Diagnosis not present

## 2018-04-30 DIAGNOSIS — M25562 Pain in left knee: Secondary | ICD-10-CM | POA: Diagnosis present

## 2018-04-30 DIAGNOSIS — S0990XA Unspecified injury of head, initial encounter: Secondary | ICD-10-CM | POA: Diagnosis not present

## 2018-04-30 LAB — URINALYSIS, ROUTINE W REFLEX MICROSCOPIC
Bilirubin Urine: NEGATIVE
GLUCOSE, UA: NEGATIVE mg/dL
Hgb urine dipstick: NEGATIVE
Ketones, ur: 5 mg/dL — AB
Nitrite: NEGATIVE
Protein, ur: 100 mg/dL — AB
Specific Gravity, Urine: 1.026 (ref 1.005–1.030)
pH: 5 (ref 5.0–8.0)

## 2018-04-30 LAB — CBC
HEMATOCRIT: 33.1 % — AB (ref 36.0–46.0)
Hemoglobin: 10.5 g/dL — ABNORMAL LOW (ref 12.0–15.0)
MCH: 32.4 pg (ref 26.0–34.0)
MCHC: 31.7 g/dL (ref 30.0–36.0)
MCV: 102.2 fL — ABNORMAL HIGH (ref 80.0–100.0)
NRBC: 0 % (ref 0.0–0.2)
Platelets: 150 10*3/uL (ref 150–400)
RBC: 3.24 MIL/uL — ABNORMAL LOW (ref 3.87–5.11)
RDW: 13.7 % (ref 11.5–15.5)
WBC: 6.6 10*3/uL (ref 4.0–10.5)

## 2018-04-30 LAB — SAMPLE TO BLOOD BANK

## 2018-04-30 LAB — COMPREHENSIVE METABOLIC PANEL
ALT: 10 U/L (ref 0–44)
AST: 17 U/L (ref 15–41)
Albumin: 3.4 g/dL — ABNORMAL LOW (ref 3.5–5.0)
Alkaline Phosphatase: 51 U/L (ref 38–126)
Anion gap: 6 (ref 5–15)
BILIRUBIN TOTAL: 0.7 mg/dL (ref 0.3–1.2)
BUN: 22 mg/dL (ref 8–23)
CO2: 27 mmol/L (ref 22–32)
Calcium: 8.8 mg/dL — ABNORMAL LOW (ref 8.9–10.3)
Chloride: 107 mmol/L (ref 98–111)
Creatinine, Ser: 1.2 mg/dL — ABNORMAL HIGH (ref 0.44–1.00)
GFR calc non Af Amer: 36 mL/min — ABNORMAL LOW (ref >60)
GFR, EST AFRICAN AMERICAN: 41 mL/min — AB (ref >60)
Glucose, Bld: 163 mg/dL — ABNORMAL HIGH (ref 70–99)
Potassium: 4.5 mmol/L (ref 3.5–5.1)
Sodium: 140 mmol/L (ref 135–145)
TOTAL PROTEIN: 5.9 g/dL — AB (ref 6.5–8.1)

## 2018-04-30 LAB — PROTIME-INR
INR: 1.02
Prothrombin Time: 13.3 seconds (ref 11.4–15.2)

## 2018-04-30 LAB — I-STAT CG4 LACTIC ACID, ED: Lactic Acid, Venous: 1.56 mmol/L (ref 0.5–1.9)

## 2018-04-30 LAB — I-STAT CHEM 8, ED
BUN: 24 mg/dL — ABNORMAL HIGH (ref 8–23)
Calcium, Ion: 1.1 mmol/L — ABNORMAL LOW (ref 1.15–1.40)
Chloride: 103 mmol/L (ref 98–111)
Creatinine, Ser: 1.1 mg/dL — ABNORMAL HIGH (ref 0.44–1.00)
GLUCOSE: 159 mg/dL — AB (ref 70–99)
HCT: 30 % — ABNORMAL LOW (ref 36.0–46.0)
Hemoglobin: 10.2 g/dL — ABNORMAL LOW (ref 12.0–15.0)
Potassium: 4.5 mmol/L (ref 3.5–5.1)
Sodium: 140 mmol/L (ref 135–145)
TCO2: 28 mmol/L (ref 22–32)

## 2018-04-30 LAB — CDS SEROLOGY

## 2018-04-30 LAB — ETHANOL: Alcohol, Ethyl (B): <10 mg/dL (ref <10)

## 2018-04-30 MED ORDER — IOPAMIDOL (ISOVUE-370) INJECTION 76%
100.0000 mL | Freq: Once | INTRAVENOUS | Status: AC | PRN
  Administered 2018-04-30: 100 mL via INTRAVENOUS

## 2018-04-30 MED ORDER — FENTANYL CITRATE (PF) 100 MCG/2ML IJ SOLN
25.0000 ug | Freq: Once | INTRAMUSCULAR | Status: AC
  Administered 2018-04-30: 25 ug via INTRAVENOUS
  Filled 2018-04-30: qty 2

## 2018-04-30 NOTE — ED Provider Notes (Signed)
[facility] Provider Note   CSN: [id number] Arrival date & time: 04/30/18  1946     History   Chief Complaint Chief Complaint  Patient presents with  . Fall    HPI KATLYNE NISHIDA is a 85 y.o. female.  HPI   Patient is a 85 year old female with a past medical history of CHF and GERD who presents via EMS from home for evaluation of a ground-level fall.  Patient states she does not know how she fell but thinks she struck her head and her left hip.  She states that she was unable to ambulate after the fall.  She states her only subsequent pain is in her left hip and denies any headache, neck pain, chest pain, domino pain, vomiting, diarrhea, right lower extremity pain, or upper extremity pain.  Denies any recent fevers, chills, dysuria, rash, or other acute complaints.  Denies any recent changes to her medications.  Denies alleviating or aggravating factors.  Denies prior similar episodes.  Past Medical History:  Diagnosis Date  . GERD (gastroesophageal reflux disease)     Patient Active Problem List   Diagnosis Date Noted  . Hip fracture ([place]) 04/30/2018  . Hypoxia 05/11/2013  . CHF (congestive heart failure) ([place]) 05/11/2013  . CTS (carpal tunnel syndrome) 03/04/2011  . CARPAL TUNNEL SYNDROME, RIGHT 03/19/2010  . RUPTURE ROTATOR CUFF 03/19/2010  . CLOSED FRACTURE OF OLECRANON PROCESS OF ULNA 11/07/2009    Past Surgical History:  Procedure Laterality Date  . APPENDECTOMY     85 yr old  . BREAST LUMPECTOMY    . CATARACT EXTRACTION    . ELBOW SURGERY    . HEMORROIDECTOMY    . HERNIA REPAIR    . SHOULDER SURGERY     left rotator cuff  . TONSILLECTOMY AND ADENOIDECTOMY     9 yr  . tubal removal       OB History   No obstetric history on file.      Home Medications    Prior to Admission medications   Medication Sig Start Date End Date Taking? Authorizing Provider  acetaminophen (TYLENOL) 500 MG tablet Take 1,000 mg by  mouth every 6 (six) hours as needed for moderate pain.   Yes [provider]  omeprazole (PRILOSEC) 20 MG capsule Take 20 mg by mouth daily.  07/22/10  Yes [provider]  albuterol (PROVENTIL) (2.5 MG/3ML) 0.083% nebulizer solution Take 3 mLs (2.5 mg total) by nebulization every 4 (four) hours as needed for wheezing or shortness of breath. Patient not taking: Reported on 03/31/2018 05/15/13   Hosie Poisson, MD  senna-docusate (SENOKOT-S) 8.6-50 MG per tablet Take 1 tablet by mouth 2 (two) times daily. Patient not taking: Reported on 03/31/2018 05/15/13   Hosie Poisson, MD    Family History Family History  Problem Relation Age of Onset  . Cancer Other   . Diabetes Other     Social History Social History   Tobacco Use  . Smoking status: Never Smoker  . Smokeless tobacco: Never Used  Substance Use Topics  . Alcohol use: No  . Drug use: Not on file     Allergies   Aspirin; Codeine; and Penicillins   Review of Systems Review of Systems  Constitutional: Negative for chills and fever.  HENT: Negative for ear pain and sore throat.   Eyes: Negative for pain and visual disturbance.  Respiratory: Negative for cough and shortness of breath.   Cardiovascular: Negative for chest pain and  palpitations.  Gastrointestinal: Negative for abdominal pain and vomiting.  Genitourinary: Negative for dysuria and hematuria.  Musculoskeletal: Positive for arthralgias ( L hip, L knee) and myalgias ( L hip, L knee). Negative for back pain.  Skin: Negative for color change and rash.  Neurological: Negative for seizures and syncope.  All other systems reviewed and are negative.    Physical Exam Updated Vital Signs BP 128/64   Pulse 78   Temp 97.8 F (36.6 C) (Oral)   Resp 19   SpO2 96%   Physical Exam Vitals signs and nursing note reviewed.  Constitutional:      General: She is not in acute distress.    Appearance: She is well-developed.  HENT:     Head: Normocephalic  and atraumatic.  Eyes:     Conjunctiva/sclera: Conjunctivae normal.  Neck:     Musculoskeletal: Neck supple.  Cardiovascular:     Rate and Rhythm: Normal rate and regular rhythm.     Pulses:          Radial pulses are 2+ on the right side and 2+ on the left side.       Dorsalis pedis pulses are 2+ on the right side and 2+ on the left side.     Heart sounds: No murmur.  Pulmonary:     Effort: Pulmonary effort is normal. No respiratory distress.     Breath sounds: Normal breath sounds.  Abdominal:     Palpations: Abdomen is soft.     Tenderness: There is no abdominal tenderness.  Musculoskeletal:     Left hip: She exhibits decreased range of motion, decreased strength and tenderness ( over mid thigh circumferentially ).     Cervical back: She exhibits no bony tenderness.     Thoracic back: She exhibits no bony tenderness.     Lumbar back: She exhibits no bony tenderness.  Skin:    General: Skin is warm and dry.  Neurological:     Mental Status: She is alert.    Patient has sensation intact to light touch throughout both lower extremities.  She is able to flex and extend at the knee as well as dorsi and plantar flex both feet.  ED Treatments / Results  Labs (all labs ordered are listed, but only abnormal results are displayed) Labs Reviewed  COMPREHENSIVE METABOLIC PANEL - Abnormal; Notable for the following components:      Result Value   Glucose, Bld 163 (*)    Creatinine, Ser 1.20 (*)    Calcium 8.8 (*)    Total Protein 5.9 (*)    Albumin 3.4 (*)    GFR calc non Af Amer 36 (*)    GFR calc Af Amer 41 (*)    All other components within normal limits  CBC - Abnormal; Notable for the following components:   RBC 3.24 (*)    Hemoglobin 10.5 (*)    HCT 33.1 (*)    MCV 102.2 (*)    All other components within normal limits  URINALYSIS, ROUTINE W REFLEX MICROSCOPIC - Abnormal; Notable for the following components:   Color, Urine AMBER (*)    APPearance HAZY (*)    Ketones,  ur 5 (*)    Protein, ur 100 (*)    Leukocytes, UA SMALL (*)    Bacteria, UA RARE (*)    All other components within normal limits  I-STAT CHEM 8, ED - Abnormal; Notable for the following components:   BUN 24 (*)    Creatinine,  Ser 1.10 (*)    Glucose, Bld 159 (*)    Calcium, Ion 1.10 (*)    Hemoglobin 10.2 (*)    HCT 30.0 (*)    All other components within normal limits  ETHANOL  PROTIME-INR  CDS SEROLOGY  TROPONIN I  I-STAT CG4 LACTIC ACID, ED  SAMPLE TO BLOOD BANK    EKG None  Radiology Dg Tibia/fibula Left  Result Date: 04/30/2018 CLINICAL DATA:  Fall EXAM: LEFT TIBIA AND FIBULA - 2 VIEW COMPARISON:  None. FINDINGS: Joint space calcifications at the knee. No acute displaced fracture or malalignment. Diffuse soft tissue swelling. Vascular calcifications. IMPRESSION: No acute osseous abnormality.  Chondrocalcinosis at the knee Electronically Signed   By: Donavan Foil M.D.   On: 04/30/2018 20:50   Dg Ankle Complete Left  Result Date: 04/30/2018 CLINICAL DATA:  Fall with left-sided leg pain EXAM: LEFT ANKLE COMPLETE - 3+ VIEW COMPARISON:  None. FINDINGS: Diffuse soft tissue edema. Bones appear osteopenic. No fracture or malalignment. Moderate plantar calcaneal spur. IMPRESSION: No acute osseous abnormality. Electronically Signed   By: Donavan Foil M.D.   On: 04/30/2018 20:51   Ct Head Wo Contrast  Result Date: 04/30/2018 CLINICAL DATA:  Slipped and fell EXAM: CT HEAD WITHOUT CONTRAST CT CERVICAL SPINE WITHOUT CONTRAST TECHNIQUE: Multidetector CT imaging of the head and cervical spine was performed following the standard protocol without intravenous contrast. Multiplanar CT image reconstructions of the cervical spine were also generated. COMPARISON:  None. FINDINGS: CT HEAD FINDINGS Brain: Diffuse parenchymal atrophy. Patchy areas of hypoattenuation in deep and periventricular white matter bilaterally. Negative for acute intracranial hemorrhage, mass lesion, acute infarction,  midline shift, or mass-effect. Acute infarct may be inapparent on noncontrast CT. Ventricles and sulci symmetric. Bilateral basal ganglia mineralization. Vascular: Atherosclerotic and physiologic intracranial calcifications. Skull: Normal. Negative for fracture or focal lesion. Sinuses/Orbits: Mucoperiosteal thickening in the left maxillary sinus. Missing dentition. Other: Left frontal scalp soft tissue swelling. CT CERVICAL SPINE FINDINGS Alignment: Mild cervical dextroscoliosis. No significant spondylolisthesis, with some degradation of reconstructions due to motion. Skull base and vertebrae: Negative for fracture or focal bone lesion. Soft tissues and spinal canal: No visible canal hematoma. No prevertebral fluid or gas. Disc levels: Asymmetric facet DJD C2-T1, left worse than right. Narrowing of interspaces C4-C7 with small endplate spurs. Upper chest: Negative. Other: Bilateral calcified carotid bifurcation plaque. IMPRESSION: 1. Negative for bleed or other acute intracranial process. 2. Atrophy and nonspecific white matter changes. 3. Negative for cervical fracture or other acute bone abnormality. 4. Multilevel cervical degenerative changes as above. Electronically Signed   By: Lucrezia Europe M.D.   On: 04/30/2018 21:29   Ct Cervical Spine Wo Contrast  Result Date: 04/30/2018 CLINICAL DATA:  Slipped and fell EXAM: CT HEAD WITHOUT CONTRAST CT CERVICAL SPINE WITHOUT CONTRAST TECHNIQUE: Multidetector CT imaging of the head and cervical spine was performed following the standard protocol without intravenous contrast. Multiplanar CT image reconstructions of the cervical spine were also generated. COMPARISON:  None. FINDINGS: CT HEAD FINDINGS Brain: Diffuse parenchymal atrophy. Patchy areas of hypoattenuation in deep and periventricular white matter bilaterally. Negative for acute intracranial hemorrhage, mass lesion, acute infarction, midline shift, or mass-effect. Acute infarct may be inapparent on noncontrast CT.  Ventricles and sulci symmetric. Bilateral basal ganglia mineralization. Vascular: Atherosclerotic and physiologic intracranial calcifications. Skull: Normal. Negative for fracture or focal lesion. Sinuses/Orbits: Mucoperiosteal thickening in the left maxillary sinus. Missing dentition. Other: Left frontal scalp soft tissue swelling. CT CERVICAL SPINE FINDINGS Alignment: Mild cervical dextroscoliosis.  No significant spondylolisthesis, with some degradation of reconstructions due to motion. Skull base and vertebrae: Negative for fracture or focal bone lesion. Soft tissues and spinal canal: No visible canal hematoma. No prevertebral fluid or gas. Disc levels: Asymmetric facet DJD C2-T1, left worse than right. Narrowing of interspaces C4-C7 with small endplate spurs. Upper chest: Negative. Other: Bilateral calcified carotid bifurcation plaque. IMPRESSION: 1. Negative for bleed or other acute intracranial process. 2. Atrophy and nonspecific white matter changes. 3. Negative for cervical fracture or other acute bone abnormality. 4. Multilevel cervical degenerative changes as above. Electronically Signed   By: Lucrezia Europe M.D.   On: 04/30/2018 21:29   Ct Abdomen Pelvis W Contrast  Result Date: 04/30/2018 CLINICAL DATA:  Slip and fall injury. EXAM: CT ABDOMEN AND PELVIS WITH CONTRAST TECHNIQUE: Multidetector CT imaging of the abdomen and pelvis was performed using the standard protocol following bolus administration of intravenous contrast. CONTRAST:  117mL ISOVUE-370 IOPAMIDOL (ISOVUE-370) INJECTION 76% COMPARISON:  None. FINDINGS: Lower chest: Motion artifact limits evaluation. Linear atelectasis in the lung bases. Hepatobiliary: Motion artifact limits evaluation. Several stones in the gallbladder. No definite gallbladder wall thickening or infiltration. No bile duct dilatation. Benign-appearing cyst in the left lobe of the liver measuring about 3.7 cm diameter. Pancreas: Atrophic appearance of the pancreas. No focal  lesions identified. Spleen: Normal in size without focal abnormality. Adrenals/Urinary Tract: Adrenal glands are unremarkable. Kidneys are normal, without renal calculi, focal lesion, or hydronephrosis. Bladder is unremarkable. Stomach/Bowel: Stomach, small bowel, and colon are not abnormally distended. Stool throughout the colon. Prominent stool in the rectum. Right lower quadrant abdominal wall hernia consistent with spigelian hernia containing a portion of the right colon but without proximal obstruction. Vascular/Lymphatic: Tortuous and calcified aorta without aneurysmal dilatation. Inferior vena cava is unremarkable. No significant lymphadenopathy. Reproductive: Somewhat atrophic uterus with calcified fibroids. Suggestion of fluid in the endometrium which would be abnormal in a patient of this age. Consider gynecology examination or follow-up pelvic ultrasound for further evaluation. Other: No free air or free fluid in the abdomen. Musculoskeletal: Degenerative changes throughout the lumbar spine. Comminuted inter trochanteric fractures of the left hip with varus angulation appear acute. No dislocation. Old appearing fracture deformities of the left superior and inferior pubic rami. IMPRESSION: 1. Acute comminuted inter trochanteric fractures of the left hip with varus angulation. No dislocation. 2. Cholelithiasis without evidence of cholecystitis. 3. Right lower quadrant abdominal wall hernia containing a portion of the right colon but without proximal obstruction. This could represent acute or chronic finding. 4. Somewhat atrophic uterus with calcified fibroids. Suggestion of fluid in the endometrium which would be abnormal in a patient of this age. Consider gynecology examination or follow-up pelvic ultrasound for further evaluation. Aortic Atherosclerosis (ICD10-I70.0). Electronically Signed   By: Lucienne Capers M.D.   On: 04/30/2018 21:40   Dg Pelvis Portable  Result Date: 04/30/2018 CLINICAL DATA:   Fall EXAM: PORTABLE PELVIS 1-2 VIEWS COMPARISON:  None. FINDINGS: Acute slightly comminuted fracture involving the left trochanter with cephalad migration of the distal femur. The left femoral head projects in joint. There may be additional left superior pubic ramus fracture. Moderate to marked arthritis of the right hip. IMPRESSION: 1. Acute displaced left trochanteric fracture 2. Possible acute left superior pubic ramus fracture Electronically Signed   By: Donavan Foil M.D.   On: 04/30/2018 20:49   Ct L-spine No Charge  Result Date: 04/30/2018 CLINICAL DATA:  Initial evaluation for acute trauma, fall. EXAM: CT LUMBAR SPINE WITHOUT CONTRAST TECHNIQUE:  Multidetector CT imaging of the lumbar spine was performed without intravenous contrast administration. Multiplanar CT image reconstructions were also generated. COMPARISON:  Concomitant CT of the abdomen and pelvis performed at the same time. Comparison also made with prior CT from 06/21/2015. FINDINGS: Segmentation: Transitional lumbosacral anatomy. For the purposes of this dictation the lowest rib-bearing vertebra will be labeled T12. L5 vertebral body sacralized with rudimentary L5-S1 joint. Alignment: Levoscoliosis with apex at L2-3. Trace chronic anterolisthesis of L4 on L5, stable. Vertebrae: Vertebral body heights maintained without evidence for acute or chronic fracture. Small chronic endplate Schmorl's nodes present at the superior endplates of L2 and L3. Visualized sacrum and pelvis intact. SI joints approximated and symmetric. No discrete osseous lesions. Diffuse osteopenia. Paraspinal and other soft tissues: Paraspinous soft tissues demonstrate no acute finding. Chronic fatty atrophy noted within the posterior paraspinous musculature. Large volume stool seen impacted within the rectal vault. Extensive aorto bi-iliac atherosclerotic disease. Disc levels: T12-L1: Chronic intervertebral disc space narrowing with endplate osteophytic spurring. Mild facet  hypertrophy. No significant stenosis. L1-2: Chronic intervertebral disc space narrowing with mild diffuse disc bulge and disc desiccation. Mild bilateral facet hypertrophy. No significant spinal stenosis. Moderate to severe right with mild left L1 foraminal stenosis. L2-3: Mild diffuse disc bulge with intervertebral disc space narrowing. Endplate osteophytic spurring anteriorly. Mild bilateral facet hypertrophy. No significant spinal stenosis. Foramina are patent. L3-4: Chronic intervertebral disc space narrowing with bridging syndesmophytes. Moderate facet arthrosis. Mild spinal stenosis with bilateral L3 foraminal narrowing. L4-5: Chronic trace anterolisthesis. Partial ankylosis with bridging syndesmophyte aches per ring. Advanced facet arthrosis. No significant spinal stenosis. Moderate bilateral L4 foraminal narrowing, left greater than right. L5-S1: Transitional lumbosacral anatomy with sacralization of the L5 vertebral body and rudimentary L5-S1 disc. No significant stenosis. IMPRESSION: 1. No acute traumatic injury within the lumbar spine. 2. Levoscoliosis with associated mild to moderate multilevel degenerative spondylolysis and facet arthrosis as above, similar to previous. 3. Large volume stool impacted within the rectal vault. 4. Aortic atherosclerosis. Electronically Signed   By: Jeannine Boga M.D.   On: 04/30/2018 22:26   Dg Chest Port 1 View  Result Date: 04/30/2018 CLINICAL DATA:  Fall EXAM: PORTABLE CHEST 1 VIEW COMPARISON:  04/22/2017, CT cervical spine 06/21/2015, radiograph 05/13/2013 FINDINGS: No acute airspace disease or pleural effusion. Mild cardiomegaly with aortic atherosclerosis. No pneumothorax. Left upper chronic rib fractures. Prominent right paratracheal opacity, likely corresponding to CT demonstrated thyroid masses IMPRESSION: 1. Mild cardiomegaly without acute opacity 2. Similar appearance of right paratracheal mass, presumably corresponding to thyroid mass on previous CT.  Electronically Signed   By: Donavan Foil M.D.   On: 04/30/2018 20:45   Dg Knee Complete 4 Views Left  Result Date: 04/30/2018 CLINICAL DATA:  Fall EXAM: LEFT KNEE - COMPLETE 4+ VIEW COMPARISON:  None. FINDINGS: Bones appear osteopenic. Joint space calcifications. No fracture or malalignment. Moderate degenerative changes of the medial and lateral joint space with mild patellofemoral degenerative change. No knee effusion. Vascular calcification IMPRESSION: Degenerative changes without acute osseous abnormality Electronically Signed   By: Donavan Foil M.D.   On: 04/30/2018 20:47   Dg Femur Portable Min 2 Views Left  Result Date: 04/30/2018 CLINICAL DATA:  Fall EXAM: LEFT FEMUR PORTABLE 2 VIEWS COMPARISON:  None. FINDINGS: Acute slightly comminuted and displaced left intertrochanteric fracture. Mid to distal femur appear intact. Vascular calcifications. IMPRESSION: Acute slightly comminuted and displaced left intertrochanteric fracture Electronically Signed   By: Donavan Foil M.D.   On: 04/30/2018 20:46  Procedures Procedures (including critical care time)  Medications Ordered in ED Medications  fentaNYL (SUBLIMAZE) injection 25 mcg (25 mcg Intravenous Given 04/30/18 2149)  iopamidol (ISOVUE-370) 76 % injection 100 mL (100 mLs Intravenous Contrast Given 04/30/18 2127)     Initial Impression / Assessment and Plan / ED Course  I have reviewed the triage vital signs and the nursing notes.  Pertinent labs & imaging results that were available during my care of the patient were reviewed by me and considered in my medical decision making (see chart for details).     Patient is a 85 year old female who presents with above-stated history exam for evaluation after a ground-level fall.  On visitation patient is afebrile stable vital signs.  ABCs and intact as above.  Exam notable for decreased range of motion and strength at the left hip with the patient holding the left lower extremity in  external rotation.  ECG shows A. fib with a ventricular rate of 75, normal axis, normal intervals, no signs of acute ischemic change.  CMP shows no significant electrolyte or metabolic abnormalities.  CBC shows a WBC count of 6.6, hemoglobin 10.5, platelets 150.  Lactic acid 1.56.  UA remarkable for 5 ketones, 100 protein, and small ileus.  CT Head and C Spine 1. Negative for bleed or other acute intracranial process. 2. Atrophy and nonspecific white matter changes. 3. Negative for cervical fracture or other acute bone abnormality. 4. Multilevel cervical degenerative changes as above.  CT A/P  1. Acute comminuted inter trochanteric fractures of the left hip with varus angulation. No dislocation. 2. Cholelithiasis without evidence of cholecystitis. 3. Right lower quadrant abdominal wall hernia containing a portion of the right colon but without proximal obstruction. This could represent acute or chronic finding. 4. Somewhat atrophic uterus with calcified fibroids. Suggestion of fluid in the endometrium which would be abnormal in a patient of this age. Consider gynecology examination or follow-up pelvic ultrasound for further evaluation.  XR L Femur  Acute slightly comminuted and displaced left intertrochanteric Fracture  XR L Knee Degenerative changes without acute osseous abnormality  XR L Tib/Fib No acute osseous abnormality.  Chondrocalcinosis at the knee  XR L Ankle No acute osseous abnormality.  Orthopedic consultant Dr. Inda Merlin was consulted.  Per consultant patient will require admission for surgery tomorrow morning.  Patient admitted to hospital service in stable condition for further evaluation management.  Final Clinical Impressions(s) / ED Diagnoses   Final diagnoses:  Fall  Closed displaced intertrochanteric fracture of left femur, initial encounter [facility])    ED Discharge Orders    None       Hulan Saas, MD 04/30/18 Oswaldo Done    Davonna Belling,  MD 05/01/18 [phone]

## 2018-04-30 NOTE — ED Triage Notes (Signed)
Pt arrives via EMS from home after falling. Pt states she slipped and fell. Pt with L hip pain and L knee pain. +shortening and rotation. VSS with EMS. 55mcg fentanyl given en route.

## 2018-04-30 NOTE — ED Notes (Signed)
Pt returned from CT °

## 2018-04-30 NOTE — ED Notes (Signed)
Pt returned from CT. Family remains at the bedside.

## 2018-05-01 ENCOUNTER — Inpatient Hospital Stay (HOSPITAL_COMMUNITY): Payer: Medicare HMO | Admitting: Anesthesiology

## 2018-05-01 ENCOUNTER — Encounter (HOSPITAL_COMMUNITY): Payer: Self-pay

## 2018-05-01 ENCOUNTER — Other Ambulatory Visit: Payer: Self-pay

## 2018-05-01 ENCOUNTER — Inpatient Hospital Stay (HOSPITAL_COMMUNITY): Payer: Medicare HMO

## 2018-05-01 ENCOUNTER — Encounter (HOSPITAL_COMMUNITY)
Admission: EM | Disposition: A | Discharge status: Skilled Nursing Facility | Payer: Self-pay | Source: Home / Self Care | Attending: Internal Medicine

## 2018-05-01 DIAGNOSIS — N179 Acute kidney failure, unspecified: Secondary | ICD-10-CM

## 2018-05-01 DIAGNOSIS — D649 Anemia, unspecified: Secondary | ICD-10-CM

## 2018-05-01 DIAGNOSIS — S72002A Fracture of unspecified part of neck of left femur, initial encounter for closed fracture: Secondary | ICD-10-CM

## 2018-05-01 DIAGNOSIS — K5909 Other constipation: Secondary | ICD-10-CM

## 2018-05-01 DIAGNOSIS — K5900 Constipation, unspecified: Secondary | ICD-10-CM

## 2018-05-01 DIAGNOSIS — N183 Chronic kidney disease, stage 3 unspecified: Secondary | ICD-10-CM

## 2018-05-01 DIAGNOSIS — K469 Unspecified abdominal hernia without obstruction or gangrene: Secondary | ICD-10-CM

## 2018-05-01 HISTORY — DX: Anemia, unspecified: D64.9

## 2018-05-01 HISTORY — DX: Constipation, unspecified: K59.00

## 2018-05-01 HISTORY — DX: Chronic kidney disease, stage 3 unspecified: N18.30

## 2018-05-01 HISTORY — PX: INTRAMEDULLARY (IM) NAIL INTERTROCHANTERIC: SHX5875

## 2018-05-01 LAB — CBC
HCT: 27.4 % — ABNORMAL LOW (ref 36.0–46.0)
Hemoglobin: 8.9 g/dL — ABNORMAL LOW (ref 12.0–15.0)
MCH: 32.4 pg (ref 26.0–34.0)
MCHC: 32.5 g/dL (ref 30.0–36.0)
MCV: 99.6 fL (ref 80.0–100.0)
Platelets: 111 10*3/uL — ABNORMAL LOW (ref 150–400)
RBC: 2.75 MIL/uL — ABNORMAL LOW (ref 3.87–5.11)
RDW: 13.8 % (ref 11.5–15.5)
WBC: 7 10*3/uL (ref 4.0–10.5)
nRBC: 0 % (ref 0.0–0.2)

## 2018-05-01 LAB — BASIC METABOLIC PANEL
Anion gap: 6 (ref 5–15)
BUN: 22 mg/dL (ref 8–23)
CO2: 29 mmol/L (ref 22–32)
CREATININE: 0.92 mg/dL (ref 0.44–1.00)
Calcium: 8.3 mg/dL — ABNORMAL LOW (ref 8.9–10.3)
Chloride: 104 mmol/L (ref 98–111)
GFR calc non Af Amer: 49 mL/min — ABNORMAL LOW (ref >60)
GFR, EST AFRICAN AMERICAN: 57 mL/min — AB (ref >60)
Glucose, Bld: 132 mg/dL — ABNORMAL HIGH (ref 70–99)
Potassium: 4.2 mmol/L (ref 3.5–5.1)
Sodium: 139 mmol/L (ref 135–145)

## 2018-05-01 LAB — SURGICAL PCR SCREEN
MRSA, PCR: NEGATIVE
STAPHYLOCOCCUS AUREUS: NEGATIVE

## 2018-05-01 LAB — TROPONIN I

## 2018-05-01 SURGERY — FIXATION, FRACTURE, INTERTROCHANTERIC, WITH INTRAMEDULLARY ROD
Anesthesia: Monitor Anesthesia Care | Site: Hip | Laterality: Left

## 2018-05-01 MED ORDER — CHLORHEXIDINE GLUCONATE 4 % EX LIQD
60.0000 mL | Freq: Once | CUTANEOUS | Status: AC
  Administered 2018-05-01: 4 via TOPICAL
  Filled 2018-05-01: qty 60

## 2018-05-01 MED ORDER — FENTANYL CITRATE (PF) 100 MCG/2ML IJ SOLN: 25.0000 ug | INTRAMUSCULAR | Status: DC | PRN

## 2018-05-01 MED ORDER — PROPOFOL 10 MG/ML IV BOLUS
INTRAVENOUS | Status: DC | PRN
  Administered 2018-05-01: 20 mg via INTRAVENOUS

## 2018-05-01 MED ORDER — MORPHINE SULFATE (PF) 2 MG/ML IV SOLN
INTRAVENOUS | Status: AC
  Administered 2018-05-01: 1 mg via INTRAVENOUS
  Filled 2018-05-01: qty 1

## 2018-05-01 MED ORDER — ALBUTEROL SULFATE (2.5 MG/3ML) 0.083% IN NEBU: 2.5000 mg | INHALATION_SOLUTION | Freq: Four times a day (QID) | RESPIRATORY_TRACT | Status: DC | PRN

## 2018-05-01 MED ORDER — 0.9 % SODIUM CHLORIDE (POUR BTL) OPTIME
TOPICAL | Status: DC | PRN
  Administered 2018-05-01: 1000 mL

## 2018-05-01 MED ORDER — OXYCODONE HCL 5 MG/5ML PO SOLN
5.0000 mg | Freq: Once | ORAL | Status: AC | PRN
  Administered 2018-05-01: 5 mg via ORAL

## 2018-05-01 MED ORDER — ACETAMINOPHEN 160 MG/5ML PO SOLN: 1000.0000 mg | Freq: Once | ORAL | Status: DC | PRN

## 2018-05-01 MED ORDER — ACETAMINOPHEN 500 MG PO TABS: 1000.0000 mg | ORAL_TABLET | Freq: Once | ORAL | Status: DC | PRN

## 2018-05-01 MED ORDER — SODIUM CHLORIDE 0.9 % IV SOLN
INTRAVENOUS | Status: DC | PRN
  Administered 2018-05-01: 20 ug/min via INTRAVENOUS

## 2018-05-01 MED ORDER — MORPHINE SULFATE (PF) 2 MG/ML IV SOLN
1.0000 mg | INTRAVENOUS | Status: DC | PRN
  Administered 2018-05-01 – 2018-05-03 (×6): 1 mg via INTRAVENOUS
  Filled 2018-05-01 (×5): qty 1

## 2018-05-01 MED ORDER — ALBUMIN HUMAN 5 % IV SOLN
12.5000 g | Freq: Once | INTRAVENOUS | Status: AC
  Administered 2018-05-01: 12.5 g via INTRAVENOUS

## 2018-05-01 MED ORDER — CEFAZOLIN SODIUM-DEXTROSE 2-4 GM/100ML-% IV SOLN
2.0000 g | INTRAVENOUS | Status: AC
  Administered 2018-05-01: 2 g via INTRAVENOUS
  Filled 2018-05-01: qty 100

## 2018-05-01 MED ORDER — PROPOFOL 10 MG/ML IV BOLUS
INTRAVENOUS | Status: AC
  Filled 2018-05-01: qty 20

## 2018-05-01 MED ORDER — POVIDONE-IODINE 10 % EX SWAB: 2.0000 "application " | Freq: Once | CUTANEOUS | Status: DC

## 2018-05-01 MED ORDER — CEFAZOLIN SODIUM-DEXTROSE 2-3 GM-%(50ML) IV SOLR
INTRAVENOUS | Status: DC | PRN
  Administered 2018-05-01: 2 g via INTRAVENOUS

## 2018-05-01 MED ORDER — FENTANYL CITRATE (PF) 250 MCG/5ML IJ SOLN
INTRAMUSCULAR | Status: AC
  Filled 2018-05-01: qty 5

## 2018-05-01 MED ORDER — SODIUM CHLORIDE 0.9 % IV SOLN
INTRAVENOUS | Status: AC
  Administered 2018-05-01: 03:00:00 via INTRAVENOUS

## 2018-05-01 MED ORDER — OXYCODONE HCL 5 MG PO TABS: 5.0000 mg | ORAL_TABLET | Freq: Once | ORAL | Status: AC | PRN

## 2018-05-01 MED ORDER — LACTATED RINGERS IV SOLN
INTRAVENOUS | Status: DC | PRN
  Administered 2018-05-01: 12:00:00 via INTRAVENOUS

## 2018-05-01 MED ORDER — ACETAMINOPHEN 10 MG/ML IV SOLN
INTRAVENOUS | Status: AC
  Filled 2018-05-01: qty 100

## 2018-05-01 MED ORDER — MUPIROCIN 2 % EX OINT
1.0000 "application " | TOPICAL_OINTMENT | Freq: Two times a day (BID) | CUTANEOUS | Status: AC
  Administered 2018-05-01 – 2018-05-05 (×8): 1 via NASAL
  Filled 2018-05-01 (×2): qty 22

## 2018-05-01 MED ORDER — PANTOPRAZOLE SODIUM 40 MG PO TBEC
40.0000 mg | DELAYED_RELEASE_TABLET | Freq: Every day | ORAL | Status: DC
  Administered 2018-05-02 – 2018-05-07 (×6): 40 mg via ORAL
  Filled 2018-05-01 (×6): qty 1

## 2018-05-01 MED ORDER — TRAMADOL HCL 50 MG PO TABS
50.0000 mg | ORAL_TABLET | Freq: Once | ORAL | Status: AC
  Administered 2018-05-01: 50 mg via ORAL
  Filled 2018-05-01 (×2): qty 1

## 2018-05-01 MED ORDER — ALBUMIN HUMAN 5 % IV SOLN
INTRAVENOUS | Status: AC
  Filled 2018-05-01: qty 250

## 2018-05-01 MED ORDER — OXYCODONE HCL 5 MG/5ML PO SOLN
ORAL | Status: AC
  Filled 2018-05-01: qty 5

## 2018-05-01 MED ORDER — FENTANYL CITRATE (PF) 100 MCG/2ML IJ SOLN
INTRAMUSCULAR | Status: DC | PRN
  Administered 2018-05-01 (×2): 25 ug via INTRAVENOUS

## 2018-05-01 MED ORDER — ACETAMINOPHEN 10 MG/ML IV SOLN
1000.0000 mg | Freq: Once | INTRAVENOUS | Status: DC | PRN
  Administered 2018-05-01: 1000 mg via INTRAVENOUS

## 2018-05-01 MED ORDER — BUPIVACAINE-EPINEPHRINE (PF) 0.25% -1:200000 IJ SOLN
INTRAMUSCULAR | Status: AC
  Filled 2018-05-01: qty 30

## 2018-05-01 MED ORDER — LACTATED RINGERS IV SOLN
INTRAVENOUS | Status: DC
  Administered 2018-05-01 – 2018-05-03 (×3): via INTRAVENOUS

## 2018-05-01 MED ORDER — SENNOSIDES-DOCUSATE SODIUM 8.6-50 MG PO TABS
1.0000 | ORAL_TABLET | Freq: Two times a day (BID) | ORAL | Status: DC
  Administered 2018-05-01 – 2018-05-06 (×10): 1 via ORAL
  Filled 2018-05-01 (×10): qty 1

## 2018-05-01 MED ORDER — POLYETHYLENE GLYCOL 3350 17 G PO PACK
17.0000 g | PACK | Freq: Every day | ORAL | Status: DC
  Administered 2018-05-02 – 2018-05-06 (×5): 17 g via ORAL
  Filled 2018-05-01 (×5): qty 1

## 2018-05-01 SURGICAL SUPPLY — 46 items
BIT DRILL CANN LG 4.3MM (BIT) IMPLANT
BNDG COHESIVE 4X5 TAN STRL (GAUZE/BANDAGES/DRESSINGS) ×2 IMPLANT
CANISTER SUCT 3000ML PPV (MISCELLANEOUS) ×2 IMPLANT
COVER MAYO STAND STRL (DRAPES) ×2 IMPLANT
COVER PERINEAL POST (MISCELLANEOUS) ×2 IMPLANT
COVER SURGICAL LIGHT HANDLE (MISCELLANEOUS) ×2 IMPLANT
COVER WAND RF STERILE (DRAPES) ×2 IMPLANT
DRAPE C-ARM 42X72 X-RAY (DRAPES) ×2 IMPLANT
DRAPE STERI IOBAN 125X83 (DRAPES) ×2 IMPLANT
DRILL BIT CANN LG 4.3MM (BIT) ×2
DRSG PAD ABDOMINAL 8X10 ST (GAUZE/BANDAGES/DRESSINGS) ×1 IMPLANT
DURAPREP 26ML APPLICATOR (WOUND CARE) ×2 IMPLANT
ELECT REM PT RETURN 9FT ADLT (ELECTROSURGICAL) ×2
ELECTRODE REM PT RTRN 9FT ADLT (ELECTROSURGICAL) ×1 IMPLANT
EVACUATOR 1/8 PVC DRAIN (DRAIN) IMPLANT
GAUZE SPONGE 4X4 12PLY STRL (GAUZE/BANDAGES/DRESSINGS) ×1 IMPLANT
GAUZE SPONGE 4X4 12PLY STRL LF (GAUZE/BANDAGES/DRESSINGS) ×1 IMPLANT
GAUZE XEROFORM 1X8 LF (GAUZE/BANDAGES/DRESSINGS) ×2 IMPLANT
GAUZE XEROFORM 5X9 LF (GAUZE/BANDAGES/DRESSINGS) ×1 IMPLANT
GLOVE BIOGEL PI IND STRL 8 (GLOVE) ×2 IMPLANT
GLOVE BIOGEL PI INDICATOR 8 (GLOVE) ×2
GLOVE ORTHO TXT STRL SZ7.5 (GLOVE) ×4 IMPLANT
GOWN STRL REUS W/ TWL LRG LVL3 (GOWN DISPOSABLE) ×1 IMPLANT
GOWN STRL REUS W/TWL 2XL LVL3 (GOWN DISPOSABLE) ×1 IMPLANT
GOWN STRL REUS W/TWL LRG LVL3 (GOWN DISPOSABLE) ×3 IMPLANT
GUIDEPIN 3.2X17.5 THRD DISP (PIN) ×1 IMPLANT
GUIDEWIRE BALL NOSE 80CM (WIRE) ×1 IMPLANT
HIP FR NAIL LAG SCREW 10.5X110 (Orthopedic Implant) ×2 IMPLANT
KIT BASIN OR (CUSTOM PROCEDURE TRAY) ×2 IMPLANT
KIT TURNOVER KIT B (KITS) ×2 IMPLANT
LINER BOOT UNIVERSAL DISP (MISCELLANEOUS) ×1 IMPLANT
MANIFOLD NEPTUNE II (INSTRUMENTS) ×1 IMPLANT
NAIL HIP FRACT 130D 11X180 (Screw) ×1 IMPLANT
NS IRRIG 1000ML POUR BTL (IV SOLUTION) ×2 IMPLANT
PACK GENERAL/GYN (CUSTOM PROCEDURE TRAY) ×2 IMPLANT
PAD ABD 8X10 STRL (GAUZE/BANDAGES/DRESSINGS) ×1 IMPLANT
PAD ARMBOARD 7.5X6 YLW CONV (MISCELLANEOUS) ×5 IMPLANT
SCREW BONE CORTICAL 5.0X36 (Screw) ×1 IMPLANT
SCREW LAG HIP FR NAIL 10.5X110 (Orthopedic Implant) IMPLANT
STAPLER VISISTAT 35W (STAPLE) ×2 IMPLANT
SUT VIC AB 0 CT1 27 (SUTURE) ×1
SUT VIC AB 0 CT1 27XBRD ANBCTR (SUTURE) ×1 IMPLANT
SUT VIC AB 2-0 CT1 27 (SUTURE) ×2
SUT VIC AB 2-0 CT1 TAPERPNT 27 (SUTURE) ×2 IMPLANT
TAPE CLOTH SURG 6X10 WHT LF (GAUZE/BANDAGES/DRESSINGS) ×1 IMPLANT
WATER STERILE IRR 1000ML POUR (IV SOLUTION) ×2 IMPLANT

## 2018-05-01 NOTE — Plan of Care (Signed)
  Problem: Education: Goal: Verbalization of understanding the information provided (i.e., activity precautions, restrictions, etc) will improve Outcome: Progressing   Problem: Pain Management: Goal: Pain level will decrease Outcome: Progressing   Problem: Pain Management: Goal: Pain level will decrease Outcome: Progressing

## 2018-05-01 NOTE — Progress Notes (Signed)
PROGRESS NOTE  Hannah Arias BTD:[id number] DOB: 09-Aug-1910 DOA: 04/30/2018 PCP: Celene Squibb, MD  HPI/Recap of past 15 hours: 85 year old female with past medical history of GERD congestive heart failure diastolic admitted April 30, 2018 due to fall at home.  Patient is said to live by herself and was fully functional until her fall.  Patient just had repair of the left hip fracture.  Assessment/Plan: Principal Problem:   Hip fracture (HCC) Active Problems:   AKI (acute kidney injury) ([place])   CKD (chronic kidney disease), stage III (HCC)   Abdominal hernia   Constipation   Chronic anemia   1.  Left hip fracture secondary to mechanical fall status post ORIF patient is stable postop day 0  2.  Chronic kidney disease stage III.  Creatinine is at baseline we will continue gentle IV hydration as needed  3.  History of congestive heart failure which is stable we will hold her IV diuretics as her blood pressure was on the lower side.  4.  Mild transient hypo-tension.  Monitor and if needed we will give her gentle IV rehydration  5.  Chronic anemia.  Her hemoglobin is 8.9 today, we will continue to monitor  Code Status: Full code  Severity of Illness: The appropriate patient status for this patient is INPATIENT. Inpatient status is judged to be reasonable and necessary in order to provide the required intensity of service to ensure the patient's safety. The patient's presenting symptoms, physical exam findings, and initial radiographic and laboratory data in the context of their chronic comorbidities is felt to place them at high risk for further clinical deterioration. Furthermore, it is not anticipated that the patient will be medically stable for discharge from the hospital within 2 midnights of admission. The following factors support the patient status of inpatient.   " The patient's presenting symptoms include left hip pain. " The worrisome physical exam findings include left  hip fracture. " The initial radiographic and laboratory data are worrisome because of left hip fracture. " The chronic co-morbidities include history of CHF.   * I certify that at the point of admission it is my clinical judgment that the patient will require inpatient hospital care spanning beyond 2 midnights from the point of admission due to high intensity of service, high risk for further deterioration and high frequency of surveillance required.*    Family Communication: None at bedside  Disposition Plan: When stable possible rehab   Consultants:  Orthopedic: Dr. Lorin Mercy  Procedures:  LEFT BIOMET AFFIXUS (IM) SHORT NAIL, INTERTROCHANTRIC FRACTURE (Left)   Antimicrobials:  None  DVT prophylaxis: SCD   Objective: Vitals:   05/01/18 1415 05/01/18 1424 05/01/18 1430 05/01/18 1445  BP: 103/61  (!) 107/55 (!) 109/58  Pulse: 79 80  77  Resp: 20 20  20   Temp:      TempSrc:      SpO2: 94% 96%  98%    Intake/Output Summary (Last 24 hours) at 05/01/2018 1507 Last data filed at 05/01/2018 1355 Gross per 24 hour  Intake 971.02 ml  Output 250 ml  Net 721.02 ml   There were no vitals filed for this visit. There is no height or weight on file to calculate BMI.  Exam:  . General: 85 y.o. year-old female well developed well nourished in no acute distress.  Alert and oriented x3. . Cardiovascular: Regular rate and rhythm with no rubs or gallops.  Positive 3/6 systolic murmur, no thyromegaly or JVD noted.   Marland Kitchen  Respiratory: Clear to auscultation with no wheezes or rales. Good inspiratory effort. . Abdomen: Soft nontender nondistended with normal bowel sounds x4 quadrants. . Musculoskeletal: +1 lower extremity edema. 2/4 pulses in all 4 extremities. . Skin: No ulcerative lesions noted or rashes, . Psychiatry: Mood is appropriate for condition and setting    Data Reviewed: CBC: Recent Labs  Lab 04/30/18 2003 04/30/18 2011 05/01/18 0456  WBC 6.6  --  7.0  HGB 10.5*  10.2* 8.9*  HCT 33.1* 30.0* 27.4*  MCV 102.2*  --  99.6  PLT 150  --  893*   Basic Metabolic Panel: Recent Labs  Lab 04/30/18 2003 04/30/18 2011 05/01/18 0456  NA 140 140 139  K 4.5 4.5 4.2  CL 107 103 104  CO2 27  --  29  GLUCOSE 163* 159* 132*  BUN 22 24* 22  CREATININE 1.20* 1.10* 0.92  CALCIUM 8.8*  --  8.3*   GFR: CrCl cannot be calculated (Unknown ideal weight.). Liver Function Tests: Recent Labs  Lab 04/30/18 2003  AST 17  ALT 10  ALKPHOS 51  BILITOT 0.7  PROT 5.9*  ALBUMIN 3.4*   No results for input(s): LIPASE, AMYLASE in the last 168 hours. No results for input(s): AMMONIA in the last 168 hours. Coagulation Profile: Recent Labs  Lab 04/30/18 2003  INR 1.02   Cardiac Enzymes: Recent Labs  Lab 04/30/18 2338  TROPONINI <0.03   BNP (last 3 results) No results for input(s): PROBNP in the last 8760 hours. HbA1C: No results for input(s): HGBA1C in the last 72 hours. CBG: No results for input(s): GLUCAP in the last 168 hours. Lipid Profile: No results for input(s): CHOL, HDL, LDLCALC, TRIG, CHOLHDL, LDLDIRECT in the last 72 hours. Thyroid Function Tests: No results for input(s): TSH, T4TOTAL, FREET4, T3FREE, THYROIDAB in the last 72 hours. Anemia Panel: No results for input(s): VITAMINB12, FOLATE, FERRITIN, TIBC, IRON, RETICCTPCT in the last 72 hours. Urine analysis:    Component Value Date/Time   COLORURINE AMBER (A) 04/30/2018 2047   APPEARANCEUR HAZY (A) 04/30/2018 2047   LABSPEC 1.026 04/30/2018 2047   PHURINE 5.0 04/30/2018 2047   GLUCOSEU NEGATIVE 04/30/2018 2047   HGBUR NEGATIVE 04/30/2018 2047   BILIRUBINUR NEGATIVE 04/30/2018 2047   KETONESUR 5 (A) 04/30/2018 2047   PROTEINUR 100 (A) 04/30/2018 2047   UROBILINOGEN 0.2 05/11/2013 1548   NITRITE NEGATIVE 04/30/2018 2047   LEUKOCYTESUR SMALL (A) 04/30/2018 2047   Sepsis Labs: @LABRCNTIP (procalcitonin:4,lacticidven:4)  ) Recent Results (from the past 240 hour(s))  Surgical PCR  screen     Status: None   Collection Time: 05/01/18  3:27 AM  Result Value Ref Range Status   MRSA, PCR NEGATIVE NEGATIVE Final   Staphylococcus aureus NEGATIVE NEGATIVE Final    Comment: (NOTE) The Xpert SA Assay (FDA approved for NASAL specimens in patients 55 years of age and older), is one component of a comprehensive surveillance program. It is not intended to diagnose infection nor to guide or monitor treatment. Performed at [facility], [place] [address] [place], [place] [address]       Studies: Dg Tibia/fibula Left  Result Date: 04/30/2018 CLINICAL DATA:  Fall EXAM: LEFT TIBIA AND FIBULA - 2 VIEW COMPARISON:  None. FINDINGS: Joint space calcifications at the knee. No acute displaced fracture or malalignment. Diffuse soft tissue swelling. Vascular calcifications. IMPRESSION: No acute osseous abnormality.  Chondrocalcinosis at the knee Electronically Signed   By: Donavan Foil M.D.   On: 04/30/2018 20:50  Dg Ankle Complete Left  Result Date: 04/30/2018 CLINICAL DATA:  Fall with left-sided leg pain EXAM: LEFT ANKLE COMPLETE - 3+ VIEW COMPARISON:  None. FINDINGS: Diffuse soft tissue edema. Bones appear osteopenic. No fracture or malalignment. Moderate plantar calcaneal spur. IMPRESSION: No acute osseous abnormality. Electronically Signed   By: Donavan Foil M.D.   On: 04/30/2018 20:51   Ct Head Wo Contrast  Result Date: 04/30/2018 CLINICAL DATA:  Slipped and fell EXAM: CT HEAD WITHOUT CONTRAST CT CERVICAL SPINE WITHOUT CONTRAST TECHNIQUE: Multidetector CT imaging of the head and cervical spine was performed following the standard protocol without intravenous contrast. Multiplanar CT image reconstructions of the cervical spine were also generated. COMPARISON:  None. FINDINGS: CT HEAD FINDINGS Brain: Diffuse parenchymal atrophy. Patchy areas of hypoattenuation in deep and periventricular white matter bilaterally. Negative for acute intracranial hemorrhage, mass lesion, acute  infarction, midline shift, or mass-effect. Acute infarct may be inapparent on noncontrast CT. Ventricles and sulci symmetric. Bilateral basal ganglia mineralization. Vascular: Atherosclerotic and physiologic intracranial calcifications. Skull: Normal. Negative for fracture or focal lesion. Sinuses/Orbits: Mucoperiosteal thickening in the left maxillary sinus. Missing dentition. Other: Left frontal scalp soft tissue swelling. CT CERVICAL SPINE FINDINGS Alignment: Mild cervical dextroscoliosis. No significant spondylolisthesis, with some degradation of reconstructions due to motion. Skull base and vertebrae: Negative for fracture or focal bone lesion. Soft tissues and spinal canal: No visible canal hematoma. No prevertebral fluid or gas. Disc levels: Asymmetric facet DJD C2-T1, left worse than right. Narrowing of interspaces C4-C7 with small endplate spurs. Upper chest: Negative. Other: Bilateral calcified carotid bifurcation plaque. IMPRESSION: 1. Negative for bleed or other acute intracranial process. 2. Atrophy and nonspecific white matter changes. 3. Negative for cervical fracture or other acute bone abnormality. 4. Multilevel cervical degenerative changes as above. Electronically Signed   By: Lucrezia Europe M.D.   On: 04/30/2018 21:29   Ct Cervical Spine Wo Contrast  Result Date: 04/30/2018 CLINICAL DATA:  Slipped and fell EXAM: CT HEAD WITHOUT CONTRAST CT CERVICAL SPINE WITHOUT CONTRAST TECHNIQUE: Multidetector CT imaging of the head and cervical spine was performed following the standard protocol without intravenous contrast. Multiplanar CT image reconstructions of the cervical spine were also generated. COMPARISON:  None. FINDINGS: CT HEAD FINDINGS Brain: Diffuse parenchymal atrophy. Patchy areas of hypoattenuation in deep and periventricular white matter bilaterally. Negative for acute intracranial hemorrhage, mass lesion, acute infarction, midline shift, or mass-effect. Acute infarct may be inapparent on  noncontrast CT. Ventricles and sulci symmetric. Bilateral basal ganglia mineralization. Vascular: Atherosclerotic and physiologic intracranial calcifications. Skull: Normal. Negative for fracture or focal lesion. Sinuses/Orbits: Mucoperiosteal thickening in the left maxillary sinus. Missing dentition. Other: Left frontal scalp soft tissue swelling. CT CERVICAL SPINE FINDINGS Alignment: Mild cervical dextroscoliosis. No significant spondylolisthesis, with some degradation of reconstructions due to motion. Skull base and vertebrae: Negative for fracture or focal bone lesion. Soft tissues and spinal canal: No visible canal hematoma. No prevertebral fluid or gas. Disc levels: Asymmetric facet DJD C2-T1, left worse than right. Narrowing of interspaces C4-C7 with small endplate spurs. Upper chest: Negative. Other: Bilateral calcified carotid bifurcation plaque. IMPRESSION: 1. Negative for bleed or other acute intracranial process. 2. Atrophy and nonspecific white matter changes. 3. Negative for cervical fracture or other acute bone abnormality. 4. Multilevel cervical degenerative changes as above. Electronically Signed   By: Lucrezia Europe M.D.   On: 04/30/2018 21:29   Ct Abdomen Pelvis W Contrast  Result Date: 04/30/2018 CLINICAL DATA:  Slip and fall injury. EXAM: CT ABDOMEN AND  PELVIS WITH CONTRAST TECHNIQUE: Multidetector CT imaging of the abdomen and pelvis was performed using the standard protocol following bolus administration of intravenous contrast. CONTRAST:  155mL ISOVUE-370 IOPAMIDOL (ISOVUE-370) INJECTION 76% COMPARISON:  None. FINDINGS: Lower chest: Motion artifact limits evaluation. Linear atelectasis in the lung bases. Hepatobiliary: Motion artifact limits evaluation. Several stones in the gallbladder. No definite gallbladder wall thickening or infiltration. No bile duct dilatation. Benign-appearing cyst in the left lobe of the liver measuring about 3.7 cm diameter. Pancreas: Atrophic appearance of the  pancreas. No focal lesions identified. Spleen: Normal in size without focal abnormality. Adrenals/Urinary Tract: Adrenal glands are unremarkable. Kidneys are normal, without renal calculi, focal lesion, or hydronephrosis. Bladder is unremarkable. Stomach/Bowel: Stomach, small bowel, and colon are not abnormally distended. Stool throughout the colon. Prominent stool in the rectum. Right lower quadrant abdominal wall hernia consistent with spigelian hernia containing a portion of the right colon but without proximal obstruction. Vascular/Lymphatic: Tortuous and calcified aorta without aneurysmal dilatation. Inferior vena cava is unremarkable. No significant lymphadenopathy. Reproductive: Somewhat atrophic uterus with calcified fibroids. Suggestion of fluid in the endometrium which would be abnormal in a patient of this age. Consider gynecology examination or follow-up pelvic ultrasound for further evaluation. Other: No free air or free fluid in the abdomen. Musculoskeletal: Degenerative changes throughout the lumbar spine. Comminuted inter trochanteric fractures of the left hip with varus angulation appear acute. No dislocation. Old appearing fracture deformities of the left superior and inferior pubic rami. IMPRESSION: 1. Acute comminuted inter trochanteric fractures of the left hip with varus angulation. No dislocation. 2. Cholelithiasis without evidence of cholecystitis. 3. Right lower quadrant abdominal wall hernia containing a portion of the right colon but without proximal obstruction. This could represent acute or chronic finding. 4. Somewhat atrophic uterus with calcified fibroids. Suggestion of fluid in the endometrium which would be abnormal in a patient of this age. Consider gynecology examination or follow-up pelvic ultrasound for further evaluation. Aortic Atherosclerosis (ICD10-I70.0). Electronically Signed   By: Lucienne Capers M.D.   On: 04/30/2018 21:40   Dg Pelvis Portable  Result Date:  04/30/2018 CLINICAL DATA:  Fall EXAM: PORTABLE PELVIS 1-2 VIEWS COMPARISON:  None. FINDINGS: Acute slightly comminuted fracture involving the left trochanter with cephalad migration of the distal femur. The left femoral head projects in joint. There may be additional left superior pubic ramus fracture. Moderate to marked arthritis of the right hip. IMPRESSION: 1. Acute displaced left trochanteric fracture 2. Possible acute left superior pubic ramus fracture Electronically Signed   By: Donavan Foil M.D.   On: 04/30/2018 20:49   Ct L-spine No Charge  Result Date: 04/30/2018 CLINICAL DATA:  Initial evaluation for acute trauma, fall. EXAM: CT LUMBAR SPINE WITHOUT CONTRAST TECHNIQUE: Multidetector CT imaging of the lumbar spine was performed without intravenous contrast administration. Multiplanar CT image reconstructions were also generated. COMPARISON:  Concomitant CT of the abdomen and pelvis performed at the same time. Comparison also made with prior CT from 06/21/2015. FINDINGS: Segmentation: Transitional lumbosacral anatomy. For the purposes of this dictation the lowest rib-bearing vertebra will be labeled T12. L5 vertebral body sacralized with rudimentary L5-S1 joint. Alignment: Levoscoliosis with apex at L2-3. Trace chronic anterolisthesis of L4 on L5, stable. Vertebrae: Vertebral body heights maintained without evidence for acute or chronic fracture. Small chronic endplate Schmorl's nodes present at the superior endplates of L2 and L3. Visualized sacrum and pelvis intact. SI joints approximated and symmetric. No discrete osseous lesions. Diffuse osteopenia. Paraspinal and other soft tissues: Paraspinous soft tissues  demonstrate no acute finding. Chronic fatty atrophy noted within the posterior paraspinous musculature. Large volume stool seen impacted within the rectal vault. Extensive aorto bi-iliac atherosclerotic disease. Disc levels: T12-L1: Chronic intervertebral disc space narrowing with endplate  osteophytic spurring. Mild facet hypertrophy. No significant stenosis. L1-2: Chronic intervertebral disc space narrowing with mild diffuse disc bulge and disc desiccation. Mild bilateral facet hypertrophy. No significant spinal stenosis. Moderate to severe right with mild left L1 foraminal stenosis. L2-3: Mild diffuse disc bulge with intervertebral disc space narrowing. Endplate osteophytic spurring anteriorly. Mild bilateral facet hypertrophy. No significant spinal stenosis. Foramina are patent. L3-4: Chronic intervertebral disc space narrowing with bridging syndesmophytes. Moderate facet arthrosis. Mild spinal stenosis with bilateral L3 foraminal narrowing. L4-5: Chronic trace anterolisthesis. Partial ankylosis with bridging syndesmophyte aches per ring. Advanced facet arthrosis. No significant spinal stenosis. Moderate bilateral L4 foraminal narrowing, left greater than right. L5-S1: Transitional lumbosacral anatomy with sacralization of the L5 vertebral body and rudimentary L5-S1 disc. No significant stenosis. IMPRESSION: 1. No acute traumatic injury within the lumbar spine. 2. Levoscoliosis with associated mild to moderate multilevel degenerative spondylolysis and facet arthrosis as above, similar to previous. 3. Large volume stool impacted within the rectal vault. 4. Aortic atherosclerosis. Electronically Signed   By: Jeannine Boga M.D.   On: 04/30/2018 22:26   Dg Chest Port 1 View  Result Date: 04/30/2018 CLINICAL DATA:  Fall EXAM: PORTABLE CHEST 1 VIEW COMPARISON:  04/22/2017, CT cervical spine 06/21/2015, radiograph 05/13/2013 FINDINGS: No acute airspace disease or pleural effusion. Mild cardiomegaly with aortic atherosclerosis. No pneumothorax. Left upper chronic rib fractures. Prominent right paratracheal opacity, likely corresponding to CT demonstrated thyroid masses IMPRESSION: 1. Mild cardiomegaly without acute opacity 2. Similar appearance of right paratracheal mass, presumably  corresponding to thyroid mass on previous CT. Electronically Signed   By: Donavan Foil M.D.   On: 04/30/2018 20:45   Dg Knee Complete 4 Views Left  Result Date: 04/30/2018 CLINICAL DATA:  Fall EXAM: LEFT KNEE - COMPLETE 4+ VIEW COMPARISON:  None. FINDINGS: Bones appear osteopenic. Joint space calcifications. No fracture or malalignment. Moderate degenerative changes of the medial and lateral joint space with mild patellofemoral degenerative change. No knee effusion. Vascular calcification IMPRESSION: Degenerative changes without acute osseous abnormality Electronically Signed   By: Donavan Foil M.D.   On: 04/30/2018 20:47   Dg Femur Portable Min 2 Views Left  Result Date: 04/30/2018 CLINICAL DATA:  Fall EXAM: LEFT FEMUR PORTABLE 2 VIEWS COMPARISON:  None. FINDINGS: Acute slightly comminuted and displaced left intertrochanteric fracture. Mid to distal femur appear intact. Vascular calcifications. IMPRESSION: Acute slightly comminuted and displaced left intertrochanteric fracture Electronically Signed   By: Donavan Foil M.D.   On: 04/30/2018 20:46    Scheduled Meds: . [MAR Hold] mupirocin ointment  1 application Nasal BID  . oxyCODONE      . [MAR Hold] pantoprazole  40 mg Oral Daily  . [MAR Hold] polyethylene glycol  17 g Oral Daily  . [MAR Hold] povidone-iodine  2 application Topical Once  . [MAR Hold] senna-docusate  1 tablet Oral BID    Continuous Infusions: . acetaminophen    . acetaminophen 1,000 mg (05/01/18 1440)  . lactated ringers 10 mL/hr at 05/01/18 0920     LOS: 1 day     Cristal Deer, MD [facility]  To reach me or the doctor on call, go to: [email or web address] Password [facility]  05/01/2018, 3:07 PM

## 2018-05-01 NOTE — Progress Notes (Signed)
BP slightly lower than earlier, 88-96/48-50, MAP 50-64, NSR 73, pt awakens easily and reponds approp. Dr Ermalene Postin updated. Will continue to monitor.

## 2018-05-01 NOTE — Progress Notes (Signed)
Pt off floor for surgery. Son at bedside and escorted to holding area with pt.

## 2018-05-01 NOTE — Transfer of Care (Signed)
Immediate Anesthesia Transfer of Care Note  Patient: DENEKA GREENWALT  Procedure(s) Performed: LEFT BIOMET AFFIXUS (IM) SHORT NAIL, INTERTROCHANTRIC FRACTURE (Left Hip)  Patient Location: PACU  Anesthesia Type:MAC and Regional  Level of Consciousness: awake, alert , oriented and sedated  Airway & Oxygen Therapy: Patient Spontanous Breathing and Patient connected to nasal cannula oxygen  Post-op Assessment: Report given to RN, Post -op Vital signs reviewed and stable and Patient moving all extremities  Post vital signs: Reviewed and stable  Last Vitals:  Vitals Value Taken Time  BP 149/71 05/01/2018  1:53 PM  Temp 36.5 C 05/01/2018  1:45 PM  Pulse 77 05/01/2018  1:55 PM  Resp 22 05/01/2018  1:55 PM  SpO2 100 % 05/01/2018  1:55 PM  Vitals shown include unvalidated device data.  Last Pain:  Vitals:   05/01/18 1345  TempSrc:   PainSc: Asleep         Complications: No apparent anesthesia complications

## 2018-05-01 NOTE — Progress Notes (Signed)
Pt is very hard of hearing. Adm to PACU with 1 hearing aid in left ear.

## 2018-05-01 NOTE — Plan of Care (Signed)
  Problem: Clinical Measurements: Goal: Postoperative complications will be avoided or minimized Outcome: Progressing   Problem: Pain Management: Goal: Pain level will decrease Outcome: Progressing   

## 2018-05-01 NOTE — Consult Note (Signed)
Reason for Consult:left IT hip Fx, closed Referring Physician: Kyung Bacca MD   Hannah Arias is an 85 y.o. female.  HPI: 85 year old female who lives independently fell with a left intertrochanteric hip fracture.  She is had previous rotator cuff repair about 20 years ago on her left shoulder which was done by me.  Patient's alert oriented.  She had immediate pain and inability to ambulate after she fell.  X-rays demonstrated intertrochanteric hip fracture.  Past Medical History:  Diagnosis Date  . GERD (gastroesophageal reflux disease)     Past Surgical History:  Procedure Laterality Date  . APPENDECTOMY     85 yr old  . BREAST LUMPECTOMY    . CATARACT EXTRACTION    . ELBOW SURGERY    . HEMORROIDECTOMY    . HERNIA REPAIR    . SHOULDER SURGERY     left rotator cuff  . TONSILLECTOMY AND ADENOIDECTOMY     9 yr  . tubal removal      Family History  Problem Relation Age of Onset  . Cancer Other   . Diabetes Other     Social History:  reports that she has never smoked. She has never used smokeless tobacco. She reports that she does not drink alcohol. No history on file for drug.  Allergies:  Allergies  Allergen Reactions  . Aspirin     Stomach problem  . Codeine     hallucinate  . Penicillins Rash    Has patient had a PCN reaction causing immediate rash, facial/tongue/throat swelling, SOB or lightheadedness with hypotension: Yes Has patient had a PCN reaction causing severe rash involving mucus membranes or skin necrosis: No Has patient had a PCN reaction that required hospitalization No Has patient had a PCN reaction occurring within the last 10 years: No If all of the above answers are "NO", then may proceed with Cephalosporin use.     Medications: I have reviewed the patient's current medications.  Results for orders placed or performed during the hospital encounter of 04/30/18 (from the past 48 hour(s))  Sample to Blood Bank     Status: None   Collection Time:  04/30/18  8:00 PM  Result Value Ref Range   Blood Bank Specimen SAMPLE AVAILABLE FOR TESTING    Sample Expiration      05/01/2018 Performed at [facility], [place] [address], [place],  [address]   Comprehensive metabolic panel     Status: Abnormal   Collection Time: 04/30/18  8:03 PM  Result Value Ref Range   Sodium 140 135 - 145 mmol/L   Potassium 4.5 3.5 - 5.1 mmol/L   Chloride 107 98 - 111 mmol/L   CO2 27 22 - 32 mmol/L   Glucose, Bld 163 (H) 70 - 99 mg/dL   BUN 22 8 - 23 mg/dL   Creatinine, Ser 1.20 (H) 0.44 - 1.00 mg/dL   Calcium 8.8 (L) 8.9 - 10.3 mg/dL   Total Protein 5.9 (L) 6.5 - 8.1 g/dL   Albumin 3.4 (L) 3.5 - 5.0 g/dL   AST 17 15 - 41 U/L   ALT 10 0 - 44 U/L   Alkaline Phosphatase 51 38 - 126 U/L   Total Bilirubin 0.7 0.3 - 1.2 mg/dL   GFR calc non Af Amer 36 (L) >60 mL/min   GFR calc Af Amer 41 (L) >60 mL/min   Anion gap 6 5 - 15    Comment: Performed at [facility], [place]  [address] [place], [place] [address]  CBC     Status: Abnormal   Collection Time: 04/30/18  8:03 PM  Result Value Ref Range   WBC 6.6 4.0 - 10.5 K/uL   RBC 3.24 (L) 3.87 - 5.11 MIL/uL   Hemoglobin 10.5 (L) 12.0 - 15.0 g/dL   HCT 33.1 (L) 36.0 - 46.0 %   MCV 102.2 (H) 80.0 - 100.0 fL   MCH 32.4 26.0 - 34.0 pg   MCHC 31.7 30.0 - 36.0 g/dL   RDW 13.7 11.5 - 15.5 %   Platelets 150 150 - 400 K/uL   nRBC 0.0 0.0 - 0.2 %    Comment: Performed at [facility], [place] [address], [place], [place] [address]  Ethanol     Status: None   Collection Time: 04/30/18  8:03 PM  Result Value Ref Range   Alcohol, Ethyl (B) <10 <10 mg/dL    Comment: (NOTE) Lowest detectable limit for serum alcohol is 10 mg/dL. For medical purposes only. Performed at [facility], [place] [address] [place], [place] [address]   Protime-INR     Status: None   Collection Time: 04/30/18  8:03 PM  Result Value Ref Range   Prothrombin Time 13.3 11.4 - 15.2 seconds   INR 1.02     Comment: Performed  at [facility] [address] [place], [place] [address]  I-Stat Chem 8, ED     Status: Abnormal   Collection Time: 04/30/18  8:11 PM  Result Value Ref Range   Sodium 140 135 - 145 mmol/L   Potassium 4.5 3.5 - 5.1 mmol/L   Chloride 103 98 - 111 mmol/L   BUN 24 (H) 8 - 23 mg/dL   Creatinine, Ser 1.10 (H) 0.44 - 1.00 mg/dL   Glucose, Bld 159 (H) 70 - 99 mg/dL   Calcium, Ion 1.10 (L) 1.15 - 1.40 mmol/L   TCO2 28 22 - 32 mmol/L   Hemoglobin 10.2 (L) 12.0 - 15.0 g/dL   HCT 30.0 (L) 36.0 - 46.0 %  I-Stat CG4 Lactic Acid, ED     Status: None   Collection Time: 04/30/18  8:12 PM  Result Value Ref Range   Lactic Acid, Venous 1.56 0.5 - 1.9 mmol/L  CDS serology     Status: None   Collection Time: 04/30/18  8:25 PM  Result Value Ref Range   CDS serology specimen      SPECIMEN WILL BE HELD FOR 14 DAYS IF TESTING IS REQUIRED    Comment: SPECIMEN WILL BE HELD FOR 14 DAYS IF TESTING IS REQUIRED SPECIMEN WILL BE HELD FOR 14 DAYS IF TESTING IS REQUIRED Performed at [facility], [place] [address] [place], [place] [address]   Urinalysis, Routine w reflex microscopic     Status: Abnormal   Collection Time: 04/30/18  8:47 PM  Result Value Ref Range   Color, Urine AMBER (A) YELLOW    Comment: BIOCHEMICALS MAY BE AFFECTED BY COLOR   APPearance HAZY (A) CLEAR   Specific Gravity, Urine 1.026 1.005 - 1.030   pH 5.0 5.0 - 8.0   Glucose, UA NEGATIVE NEGATIVE mg/dL   Hgb urine dipstick NEGATIVE NEGATIVE   Bilirubin Urine NEGATIVE NEGATIVE   Ketones, ur 5 (A) NEGATIVE mg/dL   Protein, ur 100 (A) NEGATIVE mg/dL   Nitrite NEGATIVE NEGATIVE   Leukocytes, UA SMALL (A) NEGATIVE   RBC / HPF 0-5 0 - 5 RBC/hpf   WBC, UA 21-50 0 -  5 WBC/hpf   Bacteria, UA RARE (A) NONE SEEN   Squamous Epithelial / LPF 0-5 0 - 5   Hyaline Casts, UA PRESENT     Comment: Performed at [facility], [place] [address] [place], [place] [address]  Troponin I - ONCE - STAT     Status: None   Collection Time:  04/30/18 11:38 PM  Result Value Ref Range   Troponin I <0.03 <0.03 ng/mL    Comment: Performed at [facility], [place] [address], [place], [place] [address]  Surgical PCR screen     Status: None   Collection Time: 05/01/18  3:27 AM  Result Value Ref Range   MRSA, PCR NEGATIVE NEGATIVE   Staphylococcus aureus NEGATIVE NEGATIVE    Comment: (NOTE) The Xpert SA Assay (FDA approved for NASAL specimens in patients 26 years of age and older), is one component of a comprehensive surveillance program. It is not intended to diagnose infection nor to guide or monitor treatment. Performed at [facility], [place] [address] [place], [place] [address]   CBC     Status: Abnormal   Collection Time: 05/01/18  4:56 AM  Result Value Ref Range   WBC 7.0 4.0 - 10.5 K/uL   RBC 2.75 (L) 3.87 - 5.11 MIL/uL   Hemoglobin 8.9 (L) 12.0 - 15.0 g/dL   HCT 27.4 (L) 36.0 - 46.0 %   MCV 99.6 80.0 - 100.0 fL   MCH 32.4 26.0 - 34.0 pg   MCHC 32.5 30.0 - 36.0 g/dL   RDW 13.8 11.5 - 15.5 %   Platelets 111 (L) 150 - 400 K/uL    Comment: REPEATED TO VERIFY SPECIMEN CHECKED FOR CLOTS Immature Platelet Fraction may be clinically indicated, consider ordering this additional test [id number] CONSISTENT WITH PREVIOUS RESULT    nRBC 0.0 0.0 - 0.2 %    Comment: Performed at [facility], [place] [address] [place], [place] [address]  Basic metabolic panel     Status: Abnormal   Collection Time: 05/01/18  4:56 AM  Result Value Ref Range   Sodium 139 135 - 145 mmol/L   Potassium 4.2 3.5 - 5.1 mmol/L   Chloride 104 98 - 111 mmol/L   CO2 29 22 - 32 mmol/L   Glucose, Bld 132 (H) 70 - 99 mg/dL   BUN 22 8 - 23 mg/dL   Creatinine, Ser 0.92 0.44 - 1.00 mg/dL   Calcium 8.3 (L) 8.9 - 10.3 mg/dL   GFR calc non Af Amer 49 (L) >60 mL/min   GFR calc Af Amer 57 (L) >60 mL/min   Anion gap 6 5 - 15    Comment: Performed at [facility] [address] [place], [place] [address]    Dg Tibia/fibula  Left  Result Date: 04/30/2018 CLINICAL DATA:  Fall EXAM: LEFT TIBIA AND FIBULA - 2 VIEW COMPARISON:  None. FINDINGS: Joint space calcifications at the knee. No acute displaced fracture or malalignment. Diffuse soft tissue swelling. Vascular calcifications. IMPRESSION: No acute osseous abnormality.  Chondrocalcinosis at the knee Electronically Signed   By: Donavan Foil M.D.   On: 04/30/2018 20:50   Dg Ankle Complete Left  Result Date: 04/30/2018 CLINICAL DATA:  Fall with left-sided leg pain EXAM: LEFT ANKLE COMPLETE - 3+ VIEW COMPARISON:  None. FINDINGS: Diffuse soft tissue edema. Bones appear osteopenic. No fracture or malalignment. Moderate plantar calcaneal spur. IMPRESSION: No acute osseous abnormality. Electronically Signed   By: Donavan Foil  M.D.   On: 04/30/2018 20:51   Ct Head Wo Contrast  Result Date: 04/30/2018 CLINICAL DATA:  Slipped and fell EXAM: CT HEAD WITHOUT CONTRAST CT CERVICAL SPINE WITHOUT CONTRAST TECHNIQUE: Multidetector CT imaging of the head and cervical spine was performed following the standard protocol without intravenous contrast. Multiplanar CT image reconstructions of the cervical spine were also generated. COMPARISON:  None. FINDINGS: CT HEAD FINDINGS Brain: Diffuse parenchymal atrophy. Patchy areas of hypoattenuation in deep and periventricular white matter bilaterally. Negative for acute intracranial hemorrhage, mass lesion, acute infarction, midline shift, or mass-effect. Acute infarct may be inapparent on noncontrast CT. Ventricles and sulci symmetric. Bilateral basal ganglia mineralization. Vascular: Atherosclerotic and physiologic intracranial calcifications. Skull: Normal. Negative for fracture or focal lesion. Sinuses/Orbits: Mucoperiosteal thickening in the left maxillary sinus. Missing dentition. Other: Left frontal scalp soft tissue swelling. CT CERVICAL SPINE FINDINGS Alignment: Mild cervical dextroscoliosis. No significant spondylolisthesis, with some  degradation of reconstructions due to motion. Skull base and vertebrae: Negative for fracture or focal bone lesion. Soft tissues and spinal canal: No visible canal hematoma. No prevertebral fluid or gas. Disc levels: Asymmetric facet DJD C2-T1, left worse than right. Narrowing of interspaces C4-C7 with small endplate spurs. Upper chest: Negative. Other: Bilateral calcified carotid bifurcation plaque. IMPRESSION: 1. Negative for bleed or other acute intracranial process. 2. Atrophy and nonspecific white matter changes. 3. Negative for cervical fracture or other acute bone abnormality. 4. Multilevel cervical degenerative changes as above. Electronically Signed   By: Lucrezia Europe M.D.   On: 04/30/2018 21:29   Ct Cervical Spine Wo Contrast  Result Date: 04/30/2018 CLINICAL DATA:  Slipped and fell EXAM: CT HEAD WITHOUT CONTRAST CT CERVICAL SPINE WITHOUT CONTRAST TECHNIQUE: Multidetector CT imaging of the head and cervical spine was performed following the standard protocol without intravenous contrast. Multiplanar CT image reconstructions of the cervical spine were also generated. COMPARISON:  None. FINDINGS: CT HEAD FINDINGS Brain: Diffuse parenchymal atrophy. Patchy areas of hypoattenuation in deep and periventricular white matter bilaterally. Negative for acute intracranial hemorrhage, mass lesion, acute infarction, midline shift, or mass-effect. Acute infarct may be inapparent on noncontrast CT. Ventricles and sulci symmetric. Bilateral basal ganglia mineralization. Vascular: Atherosclerotic and physiologic intracranial calcifications. Skull: Normal. Negative for fracture or focal lesion. Sinuses/Orbits: Mucoperiosteal thickening in the left maxillary sinus. Missing dentition. Other: Left frontal scalp soft tissue swelling. CT CERVICAL SPINE FINDINGS Alignment: Mild cervical dextroscoliosis. No significant spondylolisthesis, with some degradation of reconstructions due to motion. Skull base and vertebrae: Negative  for fracture or focal bone lesion. Soft tissues and spinal canal: No visible canal hematoma. No prevertebral fluid or gas. Disc levels: Asymmetric facet DJD C2-T1, left worse than right. Narrowing of interspaces C4-C7 with small endplate spurs. Upper chest: Negative. Other: Bilateral calcified carotid bifurcation plaque. IMPRESSION: 1. Negative for bleed or other acute intracranial process. 2. Atrophy and nonspecific white matter changes. 3. Negative for cervical fracture or other acute bone abnormality. 4. Multilevel cervical degenerative changes as above. Electronically Signed   By: Lucrezia Europe M.D.   On: 04/30/2018 21:29   Ct Abdomen Pelvis W Contrast  Result Date: 04/30/2018 CLINICAL DATA:  Slip and fall injury. EXAM: CT ABDOMEN AND PELVIS WITH CONTRAST TECHNIQUE: Multidetector CT imaging of the abdomen and pelvis was performed using the standard protocol following bolus administration of intravenous contrast. CONTRAST:  143mL ISOVUE-370 IOPAMIDOL (ISOVUE-370) INJECTION 76% COMPARISON:  None. FINDINGS: Lower chest: Motion artifact limits evaluation. Linear atelectasis in the lung bases. Hepatobiliary: Motion artifact limits evaluation. Several stones  in the gallbladder. No definite gallbladder wall thickening or infiltration. No bile duct dilatation. Benign-appearing cyst in the left lobe of the liver measuring about 3.7 cm diameter. Pancreas: Atrophic appearance of the pancreas. No focal lesions identified. Spleen: Normal in size without focal abnormality. Adrenals/Urinary Tract: Adrenal glands are unremarkable. Kidneys are normal, without renal calculi, focal lesion, or hydronephrosis. Bladder is unremarkable. Stomach/Bowel: Stomach, small bowel, and colon are not abnormally distended. Stool throughout the colon. Prominent stool in the rectum. Right lower quadrant abdominal wall hernia consistent with spigelian hernia containing a portion of the right colon but without proximal obstruction.  Vascular/Lymphatic: Tortuous and calcified aorta without aneurysmal dilatation. Inferior vena cava is unremarkable. No significant lymphadenopathy. Reproductive: Somewhat atrophic uterus with calcified fibroids. Suggestion of fluid in the endometrium which would be abnormal in a patient of this age. Consider gynecology examination or follow-up pelvic ultrasound for further evaluation. Other: No free air or free fluid in the abdomen. Musculoskeletal: Degenerative changes throughout the lumbar spine. Comminuted inter trochanteric fractures of the left hip with varus angulation appear acute. No dislocation. Old appearing fracture deformities of the left superior and inferior pubic rami. IMPRESSION: 1. Acute comminuted inter trochanteric fractures of the left hip with varus angulation. No dislocation. 2. Cholelithiasis without evidence of cholecystitis. 3. Right lower quadrant abdominal wall hernia containing a portion of the right colon but without proximal obstruction. This could represent acute or chronic finding. 4. Somewhat atrophic uterus with calcified fibroids. Suggestion of fluid in the endometrium which would be abnormal in a patient of this age. Consider gynecology examination or follow-up pelvic ultrasound for further evaluation. Aortic Atherosclerosis (ICD10-I70.0). Electronically Signed   By: Lucienne Capers M.D.   On: 04/30/2018 21:40   Dg Pelvis Portable  Result Date: 04/30/2018 CLINICAL DATA:  Fall EXAM: PORTABLE PELVIS 1-2 VIEWS COMPARISON:  None. FINDINGS: Acute slightly comminuted fracture involving the left trochanter with cephalad migration of the distal femur. The left femoral head projects in joint. There may be additional left superior pubic ramus fracture. Moderate to marked arthritis of the right hip. IMPRESSION: 1. Acute displaced left trochanteric fracture 2. Possible acute left superior pubic ramus fracture Electronically Signed   By: Donavan Foil M.D.   On: 04/30/2018 20:49   Ct  L-spine No Charge  Result Date: 04/30/2018 CLINICAL DATA:  Initial evaluation for acute trauma, fall. EXAM: CT LUMBAR SPINE WITHOUT CONTRAST TECHNIQUE: Multidetector CT imaging of the lumbar spine was performed without intravenous contrast administration. Multiplanar CT image reconstructions were also generated. COMPARISON:  Concomitant CT of the abdomen and pelvis performed at the same time. Comparison also made with prior CT from 06/21/2015. FINDINGS: Segmentation: Transitional lumbosacral anatomy. For the purposes of this dictation the lowest rib-bearing vertebra will be labeled T12. L5 vertebral body sacralized with rudimentary L5-S1 joint. Alignment: Levoscoliosis with apex at L2-3. Trace chronic anterolisthesis of L4 on L5, stable. Vertebrae: Vertebral body heights maintained without evidence for acute or chronic fracture. Small chronic endplate Schmorl's nodes present at the superior endplates of L2 and L3. Visualized sacrum and pelvis intact. SI joints approximated and symmetric. No discrete osseous lesions. Diffuse osteopenia. Paraspinal and other soft tissues: Paraspinous soft tissues demonstrate no acute finding. Chronic fatty atrophy noted within the posterior paraspinous musculature. Large volume stool seen impacted within the rectal vault. Extensive aorto bi-iliac atherosclerotic disease. Disc levels: T12-L1: Chronic intervertebral disc space narrowing with endplate osteophytic spurring. Mild facet hypertrophy. No significant stenosis. L1-2: Chronic intervertebral disc space narrowing with mild diffuse disc  bulge and disc desiccation. Mild bilateral facet hypertrophy. No significant spinal stenosis. Moderate to severe right with mild left L1 foraminal stenosis. L2-3: Mild diffuse disc bulge with intervertebral disc space narrowing. Endplate osteophytic spurring anteriorly. Mild bilateral facet hypertrophy. No significant spinal stenosis. Foramina are patent. L3-4: Chronic intervertebral disc space  narrowing with bridging syndesmophytes. Moderate facet arthrosis. Mild spinal stenosis with bilateral L3 foraminal narrowing. L4-5: Chronic trace anterolisthesis. Partial ankylosis with bridging syndesmophyte aches per ring. Advanced facet arthrosis. No significant spinal stenosis. Moderate bilateral L4 foraminal narrowing, left greater than right. L5-S1: Transitional lumbosacral anatomy with sacralization of the L5 vertebral body and rudimentary L5-S1 disc. No significant stenosis. IMPRESSION: 1. No acute traumatic injury within the lumbar spine. 2. Levoscoliosis with associated mild to moderate multilevel degenerative spondylolysis and facet arthrosis as above, similar to previous. 3. Large volume stool impacted within the rectal vault. 4. Aortic atherosclerosis. Electronically Signed   By: Jeannine Boga M.D.   On: 04/30/2018 22:26   Dg Chest Port 1 View  Result Date: 04/30/2018 CLINICAL DATA:  Fall EXAM: PORTABLE CHEST 1 VIEW COMPARISON:  04/22/2017, CT cervical spine 06/21/2015, radiograph 05/13/2013 FINDINGS: No acute airspace disease or pleural effusion. Mild cardiomegaly with aortic atherosclerosis. No pneumothorax. Left upper chronic rib fractures. Prominent right paratracheal opacity, likely corresponding to CT demonstrated thyroid masses IMPRESSION: 1. Mild cardiomegaly without acute opacity 2. Similar appearance of right paratracheal mass, presumably corresponding to thyroid mass on previous CT. Electronically Signed   By: Donavan Foil M.D.   On: 04/30/2018 20:45   Dg Knee Complete 4 Views Left  Result Date: 04/30/2018 CLINICAL DATA:  Fall EXAM: LEFT KNEE - COMPLETE 4+ VIEW COMPARISON:  None. FINDINGS: Bones appear osteopenic. Joint space calcifications. No fracture or malalignment. Moderate degenerative changes of the medial and lateral joint space with mild patellofemoral degenerative change. No knee effusion. Vascular calcification IMPRESSION: Degenerative changes without acute osseous  abnormality Electronically Signed   By: Donavan Foil M.D.   On: 04/30/2018 20:47   Dg Femur Portable Min 2 Views Left  Result Date: 04/30/2018 CLINICAL DATA:  Fall EXAM: LEFT FEMUR PORTABLE 2 VIEWS COMPARISON:  None. FINDINGS: Acute slightly comminuted and displaced left intertrochanteric fracture. Mid to distal femur appear intact. Vascular calcifications. IMPRESSION: Acute slightly comminuted and displaced left intertrochanteric fracture Electronically Signed   By: Donavan Foil M.D.   On: 04/30/2018 20:46    ROS view of systems positive for increased age and 30, cranial fracture, history of acute kidney injury, carpal tunnel, history of congestive heart failure, stage III kidney disease previous olecranon fracture previous left rotator cuff repair otherwise negative is a pertains HPI. Blood pressure 124/66, pulse 79, temperature 98.4 F (36.9 C), temperature source Oral, resp. rate 18, SpO2 93 %. Physical Exam  Constitutional: She is oriented to person, place, and time. She appears well-developed and well-nourished.  HENT:  Head: Normocephalic.  Eyes: Pupils are equal, round, and reactive to light.  Neck: Normal range of motion. No tracheal deviation present. No thyromegaly present.  Cardiovascular: Normal rate.  Respiratory: Effort normal. No respiratory distress. She has no wheezes.  GI: Soft. Bowel sounds are normal.  Musculoskeletal:     Comments: Patient has some bilateral pedal edema.  Left lower extremity shortened externally rotated.  Skin over the hip is normal this is a closed fracture.  Distal pulse palpable good capillary refill.  Sciatic sensation is intact.  Neurological: She is alert and oriented to person, place, and time. She has normal reflexes.  Psychiatric: She has a normal mood and affect. Her behavior is normal. Judgment and thought content normal.    Assessment/Plan: Patient wishes to proceed with operative fixation of her hip so she can maintain independence.   Patient son is at the bedside.  Risk surgery discussed including those risk for her age risk of anesthesia potential for sudden death at 17.  Questions were elicited and answered she understands and request to proceed.  Plan trochanteric nail short Affixis nail stabilization.  Questions elicited and answered.  She understands request to proceed.  [name] 05/01/2018, 11:11 AM

## 2018-05-01 NOTE — Op Note (Signed)
Preop diagnosis: Left intertrochanteric hip fracture  Postop diagnosis same  Procedure: Left trochanteric nail, short 11 mm diameter with 105 mm lag screw and interlock distal screw Affixis  Surgeon: Rodell Perna, MD  Anesthesia: Spinal  EBL: 100 cc  Drains: None  Brief history 85 year old female has been healthy and is in independent living with good mental acuity slipped fell suffering a left closed intertrochanteric hip fracture.  Procedure after spinal anesthesia patient was placed on the Hana table using the well leg holder standard prepping and draping after reduction with distraction internal rotation.  Area squared with towels large shower curtain Betadine Steri-Drape was applied.  Timeout procedure was completed Ancef was given prophylactically.  Incision was made starting proximal to trochanter gluteus medius fascia was split hemostasis obtained with the Bovie tip the trochanter is palpated.  The Steinmann pin was placed at the tip checked under fluoroscopy pin was advanced 2 cm overreamed and then the short affixes nail was inserted through the hole across the fracture site and advanced until it was an appropriate depth.  Hole was drilled below in the neck on AP and to the center of the head on AP and lateral.  Reamed to 142mm,110 screw placed.  Tightened down securely.  Distal interlock screw was placed in static position bicortically final spot pictures taken for confirmation irrigation with saline solution closure of the deep fascia gluteus medius with 0 Vicryl 2-0 Vicryl subtenons tissue skin staple closure postop dressing and transfer the care of room patient tolerated the procedure well was stable.

## 2018-05-01 NOTE — Anesthesia Procedure Notes (Signed)
Procedure Name: MAC Date/Time: 05/01/2018 12:15 PM Performed by: Scheryl Darter, CRNA Pre-anesthesia Checklist: Patient identified, Emergency Drugs available, Suction available, Patient being monitored and Timeout performed Patient Re-evaluated:Patient Re-evaluated prior to induction Oxygen Delivery Method: Simple face mask Placement Confirmation: positive ETCO2

## 2018-05-01 NOTE — Anesthesia Preprocedure Evaluation (Addendum)
Anesthesia Evaluation  Patient identified by MRN, date of birth, ID band Patient awake  General Assessment Comment:Hard of hearing (hearing aides out)  Airway Mallampati: II  TM Distance: >3 FB Neck ROM: Full    Dental  (+) Upper Dentures, Lower Dentures   Pulmonary shortness of breath,   Clear, shallow, tachypnea   breath sounds clear to auscultation       Cardiovascular (-) hypertension(-) angina+CHF  (-) Past MI  Rhythm:Regular Rate:Normal  Recent increase in leg swelling   Neuro/Psych neg Seizures  Neuromuscular disease negative psych ROS   GI/Hepatic Neg liver ROS, GERD  Medicated and Controlled,  Endo/Other  negative endocrine ROS  Renal/GU CRFRenal disease     Musculoskeletal   Abdominal   Peds  Hematology  (+) anemia , Denies blood thinners   Anesthesia Other Findings - Left ventricle: The cavity size was normal. Wall thickness was increased in a pattern of moderate LVH. Systolic function was normal. The estimated ejection fraction was in the range of 55% to 60%. Cannot exclude hypokinesis of the basalinferolateral myocardium. Doppler parameters are consistent with abnormal left ventricular relaxation (grade 1 diastolic dysfunction). - Aortic valve: Mildly calcified annulus. Probably trileaflet; mildly calcified leaflets. No significant regurgitation. Mean gradient: 63m Hg (S). - Mitral valve: Calcified annulus. Mild regurgitation. - Left atrium: The atrium was moderately dilated. - Right atrium: Central venous pressure: 312mHg (est). - Atrial septum: The septum was thickened. No defect or patent foramen ovale was identified. - Tricuspid valve: Mild regurgitation. - Pulmonary arteries: PA peak pressure: 4749mg (S). - Pericardium, extracardiac: A trivial pericardial effusion was identified. Cannot completely exclude left pleural effusion, atifact also noted. Impressions:   Reproductive/Obstetrics                            Anesthesia Physical Anesthesia Plan  ASA: III  Anesthesia Plan: MAC and Spinal   Post-op Pain Management:    Induction:   PONV Risk Score and Plan: 2 and Treatment may vary due to age or medical condition and Propofol infusion  Airway Management Planned: Nasal Cannula  Additional Equipment: None  Intra-op Plan:   Post-operative Plan:   Informed Consent: I have reviewed the patients History and Physical, chart, labs and discussed the procedure including the risks, benefits and alternatives for the proposed anesthesia with the patient or authorized representative who has indicated his/her understanding and acceptance.   Dental advisory given  Plan Discussed with: CRNA and Surgeon  Anesthesia Plan Comments: (Discussed plan with patient and son, tachypnea and recent dependent edema are concerning for volume overload and worsening chf, stats and resp effort adequate in preop, will plan a spinal and consider diuresis should she worsening signs of volume overload during surgery. )        Anesthesia Quick Evaluation

## 2018-05-01 NOTE — Progress Notes (Signed)
SpO2 increased to 98% on 2 lpm

## 2018-05-01 NOTE — H&P (Signed)
History and Physical    LACHAE HOHLER WSF:[id number] DOB: April 05, 1911 DOA: 04/30/2018  PCP: Celene Squibb, MD Patient coming from: Home  Chief Complaint: Fall, left hip and knee pain  HPI: LUCILA KLECKA is a 85 y.o. female with medical history significant of chronic diastolic congestive heart failure, GERD presenting to the hospital via EMS after falling at home and experiencing left hip and knee pain.  Vital signs stable per EMS.  Patient states she slipped and fell at home on 1/10.  She lives by herself.  Reports having pain in her left leg and knee since the fall.  States she did hit her head when she fell.  Denies having any dizziness, chest pain, or shortness of breath prior to the fall.  States she was in her usual state of health until the fall.  States her appetite has been good and she denies having any abdominal pain, nausea, or vomiting.  Reports having regular bowel movements.  Denies having any fevers.  States she lives in [place] and her PCP is Dr. Nevada Crane.  Denies having any pelvic pain or vaginal discharge.  Per nursing staff, patient noted to have dried feces at the bottom of her feet, question whether she slipped on her feces and fell.  Review of Systems: As per HPI otherwise 10 point review of systems negative.  Past Medical History:  Diagnosis Date  . GERD (gastroesophageal reflux disease)     Past Surgical History:  Procedure Laterality Date  . APPENDECTOMY     85 yr old  . BREAST LUMPECTOMY    . CATARACT EXTRACTION    . ELBOW SURGERY    . HEMORROIDECTOMY    . HERNIA REPAIR    . SHOULDER SURGERY     left rotator cuff  . TONSILLECTOMY AND ADENOIDECTOMY     9 yr  . tubal removal       reports that she has never smoked. She has never used smokeless tobacco. She reports that she does not drink alcohol. No history on file for drug.  Allergies  Allergen Reactions  . Aspirin     Stomach problem  . Codeine     hallucinate  . Penicillins Rash    Has patient  had a PCN reaction causing immediate rash, facial/tongue/throat swelling, SOB or lightheadedness with hypotension: Yes Has patient had a PCN reaction causing severe rash involving mucus membranes or skin necrosis: No Has patient had a PCN reaction that required hospitalization No Has patient had a PCN reaction occurring within the last 10 years: No If all of the above answers are "NO", then may proceed with Cephalosporin use.     Family History  Problem Relation Age of Onset  . Cancer Other   . Diabetes Other     Prior to Admission medications   Medication Sig Start Date End Date Taking? Authorizing Provider  acetaminophen (TYLENOL) 500 MG tablet Take 1,000 mg by mouth every 6 (six) hours as needed for moderate pain.   Yes [provider]  omeprazole (PRILOSEC) 20 MG capsule Take 20 mg by mouth daily.  07/22/10  Yes [provider]  albuterol (PROVENTIL) (2.5 MG/3ML) 0.083% nebulizer solution Take 3 mLs (2.5 mg total) by nebulization every 4 (four) hours as needed for wheezing or shortness of breath. Patient not taking: Reported on 03/31/2018 05/15/13   Hosie Poisson, MD  senna-docusate (SENOKOT-S) 8.6-50 MG per tablet Take 1 tablet by mouth 2 (two) times daily. Patient not taking: Reported on 03/31/2018  05/15/13   Hosie Poisson, MD    Physical Exam: Vitals:   04/30/18 2230 04/30/18 2245 04/30/18 2315 05/01/18 0015  BP: (!) 143/68 128/64 (!) 144/63 132/79  Pulse: 79 78 85 88  Resp: (!) 24 19  18   Temp:    98.4 F (36.9 C)  TempSrc:    Oral  SpO2: 96% 96% 95% 93%    Physical Exam  Constitutional: She is oriented to person, place, and time. She appears well-developed and well-nourished. She appears distressed.  Distressed secondary to left hip pain  HENT:  Head: Normocephalic.  Dry mucous membranes  Eyes: Right eye exhibits no discharge. Left eye exhibits no discharge.  Neck: Neck supple.  Cardiovascular: Normal rate, regular rhythm and intact distal pulses.    Murmur heard. Dorsalis pedis pulse intact bilaterally.  Lower extremities warm to touch and well perfused.  Pulmonary/Chest: Effort normal. No respiratory distress.  Anterior lung fields clear to auscultation.  Abdominal: Soft. Bowel sounds are normal. She exhibits no distension. There is no abdominal tenderness. There is no guarding.  Musculoskeletal:        General: Edema present.     Comments: Left lower extremity shortened and externally rotated. +1 to +2 pitting edema of bilateral lower extremities.  Neurological: She is alert and oriented to person, place, and time.  Skin: Skin is warm and dry. She is not diaphoretic.     Labs on Admission: I have personally reviewed following labs and imaging studies  CBC: Recent Labs  Lab 04/30/18 2003 04/30/18 2011  WBC 6.6  --   HGB 10.5* 10.2*  HCT 33.1* 30.0*  MCV 102.2*  --   PLT 150  --    Basic Metabolic Panel: Recent Labs  Lab 04/30/18 2003 04/30/18 2011  NA 140 140  K 4.5 4.5  CL 107 103  CO2 27  --   GLUCOSE 163* 159*  BUN 22 24*  CREATININE 1.20* 1.10*  CALCIUM 8.8*  --    GFR: CrCl cannot be calculated (Unknown ideal weight.). Liver Function Tests: Recent Labs  Lab 04/30/18 2003  AST 17  ALT 10  ALKPHOS 51  BILITOT 0.7  PROT 5.9*  ALBUMIN 3.4*   No results for input(s): LIPASE, AMYLASE in the last 168 hours. No results for input(s): AMMONIA in the last 168 hours. Coagulation Profile: Recent Labs  Lab 04/30/18 2003  INR 1.02   Cardiac Enzymes: Recent Labs  Lab 04/30/18 2338  TROPONINI <0.03   BNP (last 3 results) No results for input(s): PROBNP in the last 8760 hours. HbA1C: No results for input(s): HGBA1C in the last 72 hours. CBG: No results for input(s): GLUCAP in the last 168 hours. Lipid Profile: No results for input(s): CHOL, HDL, LDLCALC, TRIG, CHOLHDL, LDLDIRECT in the last 72 hours. Thyroid Function Tests: No results for input(s): TSH, T4TOTAL, FREET4, T3FREE, THYROIDAB in the  last 72 hours. Anemia Panel: No results for input(s): VITAMINB12, FOLATE, FERRITIN, TIBC, IRON, RETICCTPCT in the last 72 hours. Urine analysis:    Component Value Date/Time   COLORURINE AMBER (A) 04/30/2018 2047   APPEARANCEUR HAZY (A) 04/30/2018 2047   LABSPEC 1.026 04/30/2018 2047   PHURINE 5.0 04/30/2018 2047   GLUCOSEU NEGATIVE 04/30/2018 2047   HGBUR NEGATIVE 04/30/2018 2047   BILIRUBINUR NEGATIVE 04/30/2018 2047   KETONESUR 5 (A) 04/30/2018 2047   PROTEINUR 100 (A) 04/30/2018 2047   UROBILINOGEN 0.2 05/11/2013 1548   NITRITE NEGATIVE 04/30/2018 2047   LEUKOCYTESUR SMALL (A) 04/30/2018 2047  Radiological Exams on Admission: Dg Tibia/fibula Left  Result Date: 04/30/2018 CLINICAL DATA:  Fall EXAM: LEFT TIBIA AND FIBULA - 2 VIEW COMPARISON:  None. FINDINGS: Joint space calcifications at the knee. No acute displaced fracture or malalignment. Diffuse soft tissue swelling. Vascular calcifications. IMPRESSION: No acute osseous abnormality.  Chondrocalcinosis at the knee Electronically Signed   By: Donavan Foil M.D.   On: 04/30/2018 20:50   Dg Ankle Complete Left  Result Date: 04/30/2018 CLINICAL DATA:  Fall with left-sided leg pain EXAM: LEFT ANKLE COMPLETE - 3+ VIEW COMPARISON:  None. FINDINGS: Diffuse soft tissue edema. Bones appear osteopenic. No fracture or malalignment. Moderate plantar calcaneal spur. IMPRESSION: No acute osseous abnormality. Electronically Signed   By: Donavan Foil M.D.   On: 04/30/2018 20:51   Ct Head Wo Contrast  Result Date: 04/30/2018 CLINICAL DATA:  Slipped and fell EXAM: CT HEAD WITHOUT CONTRAST CT CERVICAL SPINE WITHOUT CONTRAST TECHNIQUE: Multidetector CT imaging of the head and cervical spine was performed following the standard protocol without intravenous contrast. Multiplanar CT image reconstructions of the cervical spine were also generated. COMPARISON:  None. FINDINGS: CT HEAD FINDINGS Brain: Diffuse parenchymal atrophy. Patchy areas of  hypoattenuation in deep and periventricular white matter bilaterally. Negative for acute intracranial hemorrhage, mass lesion, acute infarction, midline shift, or mass-effect. Acute infarct may be inapparent on noncontrast CT. Ventricles and sulci symmetric. Bilateral basal ganglia mineralization. Vascular: Atherosclerotic and physiologic intracranial calcifications. Skull: Normal. Negative for fracture or focal lesion. Sinuses/Orbits: Mucoperiosteal thickening in the left maxillary sinus. Missing dentition. Other: Left frontal scalp soft tissue swelling. CT CERVICAL SPINE FINDINGS Alignment: Mild cervical dextroscoliosis. No significant spondylolisthesis, with some degradation of reconstructions due to motion. Skull base and vertebrae: Negative for fracture or focal bone lesion. Soft tissues and spinal canal: No visible canal hematoma. No prevertebral fluid or gas. Disc levels: Asymmetric facet DJD C2-T1, left worse than right. Narrowing of interspaces C4-C7 with small endplate spurs. Upper chest: Negative. Other: Bilateral calcified carotid bifurcation plaque. IMPRESSION: 1. Negative for bleed or other acute intracranial process. 2. Atrophy and nonspecific white matter changes. 3. Negative for cervical fracture or other acute bone abnormality. 4. Multilevel cervical degenerative changes as above. Electronically Signed   By: Lucrezia Europe M.D.   On: 04/30/2018 21:29   Ct Cervical Spine Wo Contrast  Result Date: 04/30/2018 CLINICAL DATA:  Slipped and fell EXAM: CT HEAD WITHOUT CONTRAST CT CERVICAL SPINE WITHOUT CONTRAST TECHNIQUE: Multidetector CT imaging of the head and cervical spine was performed following the standard protocol without intravenous contrast. Multiplanar CT image reconstructions of the cervical spine were also generated. COMPARISON:  None. FINDINGS: CT HEAD FINDINGS Brain: Diffuse parenchymal atrophy. Patchy areas of hypoattenuation in deep and periventricular white matter bilaterally. Negative  for acute intracranial hemorrhage, mass lesion, acute infarction, midline shift, or mass-effect. Acute infarct may be inapparent on noncontrast CT. Ventricles and sulci symmetric. Bilateral basal ganglia mineralization. Vascular: Atherosclerotic and physiologic intracranial calcifications. Skull: Normal. Negative for fracture or focal lesion. Sinuses/Orbits: Mucoperiosteal thickening in the left maxillary sinus. Missing dentition. Other: Left frontal scalp soft tissue swelling. CT CERVICAL SPINE FINDINGS Alignment: Mild cervical dextroscoliosis. No significant spondylolisthesis, with some degradation of reconstructions due to motion. Skull base and vertebrae: Negative for fracture or focal bone lesion. Soft tissues and spinal canal: No visible canal hematoma. No prevertebral fluid or gas. Disc levels: Asymmetric facet DJD C2-T1, left worse than right. Narrowing of interspaces C4-C7 with small endplate spurs. Upper chest: Negative. Other: Bilateral calcified carotid bifurcation  plaque. IMPRESSION: 1. Negative for bleed or other acute intracranial process. 2. Atrophy and nonspecific white matter changes. 3. Negative for cervical fracture or other acute bone abnormality. 4. Multilevel cervical degenerative changes as above. Electronically Signed   By: Lucrezia Europe M.D.   On: 04/30/2018 21:29   Ct Abdomen Pelvis W Contrast  Result Date: 04/30/2018 CLINICAL DATA:  Slip and fall injury. EXAM: CT ABDOMEN AND PELVIS WITH CONTRAST TECHNIQUE: Multidetector CT imaging of the abdomen and pelvis was performed using the standard protocol following bolus administration of intravenous contrast. CONTRAST:  166mL ISOVUE-370 IOPAMIDOL (ISOVUE-370) INJECTION 76% COMPARISON:  None. FINDINGS: Lower chest: Motion artifact limits evaluation. Linear atelectasis in the lung bases. Hepatobiliary: Motion artifact limits evaluation. Several stones in the gallbladder. No definite gallbladder wall thickening or infiltration. No bile duct  dilatation. Benign-appearing cyst in the left lobe of the liver measuring about 3.7 cm diameter. Pancreas: Atrophic appearance of the pancreas. No focal lesions identified. Spleen: Normal in size without focal abnormality. Adrenals/Urinary Tract: Adrenal glands are unremarkable. Kidneys are normal, without renal calculi, focal lesion, or hydronephrosis. Bladder is unremarkable. Stomach/Bowel: Stomach, small bowel, and colon are not abnormally distended. Stool throughout the colon. Prominent stool in the rectum. Right lower quadrant abdominal wall hernia consistent with spigelian hernia containing a portion of the right colon but without proximal obstruction. Vascular/Lymphatic: Tortuous and calcified aorta without aneurysmal dilatation. Inferior vena cava is unremarkable. No significant lymphadenopathy. Reproductive: Somewhat atrophic uterus with calcified fibroids. Suggestion of fluid in the endometrium which would be abnormal in a patient of this age. Consider gynecology examination or follow-up pelvic ultrasound for further evaluation. Other: No free air or free fluid in the abdomen. Musculoskeletal: Degenerative changes throughout the lumbar spine. Comminuted inter trochanteric fractures of the left hip with varus angulation appear acute. No dislocation. Old appearing fracture deformities of the left superior and inferior pubic rami. IMPRESSION: 1. Acute comminuted inter trochanteric fractures of the left hip with varus angulation. No dislocation. 2. Cholelithiasis without evidence of cholecystitis. 3. Right lower quadrant abdominal wall hernia containing a portion of the right colon but without proximal obstruction. This could represent acute or chronic finding. 4. Somewhat atrophic uterus with calcified fibroids. Suggestion of fluid in the endometrium which would be abnormal in a patient of this age. Consider gynecology examination or follow-up pelvic ultrasound for further evaluation. Aortic Atherosclerosis  (ICD10-I70.0). Electronically Signed   By: Lucienne Capers M.D.   On: 04/30/2018 21:40   Dg Pelvis Portable  Result Date: 04/30/2018 CLINICAL DATA:  Fall EXAM: PORTABLE PELVIS 1-2 VIEWS COMPARISON:  None. FINDINGS: Acute slightly comminuted fracture involving the left trochanter with cephalad migration of the distal femur. The left femoral head projects in joint. There may be additional left superior pubic ramus fracture. Moderate to marked arthritis of the right hip. IMPRESSION: 1. Acute displaced left trochanteric fracture 2. Possible acute left superior pubic ramus fracture Electronically Signed   By: Donavan Foil M.D.   On: 04/30/2018 20:49   Ct L-spine No Charge  Result Date: 04/30/2018 CLINICAL DATA:  Initial evaluation for acute trauma, fall. EXAM: CT LUMBAR SPINE WITHOUT CONTRAST TECHNIQUE: Multidetector CT imaging of the lumbar spine was performed without intravenous contrast administration. Multiplanar CT image reconstructions were also generated. COMPARISON:  Concomitant CT of the abdomen and pelvis performed at the same time. Comparison also made with prior CT from 06/21/2015. FINDINGS: Segmentation: Transitional lumbosacral anatomy. For the purposes of this dictation the lowest rib-bearing vertebra will be labeled T12. L5  vertebral body sacralized with rudimentary L5-S1 joint. Alignment: Levoscoliosis with apex at L2-3. Trace chronic anterolisthesis of L4 on L5, stable. Vertebrae: Vertebral body heights maintained without evidence for acute or chronic fracture. Small chronic endplate Schmorl's nodes present at the superior endplates of L2 and L3. Visualized sacrum and pelvis intact. SI joints approximated and symmetric. No discrete osseous lesions. Diffuse osteopenia. Paraspinal and other soft tissues: Paraspinous soft tissues demonstrate no acute finding. Chronic fatty atrophy noted within the posterior paraspinous musculature. Large volume stool seen impacted within the rectal vault.  Extensive aorto bi-iliac atherosclerotic disease. Disc levels: T12-L1: Chronic intervertebral disc space narrowing with endplate osteophytic spurring. Mild facet hypertrophy. No significant stenosis. L1-2: Chronic intervertebral disc space narrowing with mild diffuse disc bulge and disc desiccation. Mild bilateral facet hypertrophy. No significant spinal stenosis. Moderate to severe right with mild left L1 foraminal stenosis. L2-3: Mild diffuse disc bulge with intervertebral disc space narrowing. Endplate osteophytic spurring anteriorly. Mild bilateral facet hypertrophy. No significant spinal stenosis. Foramina are patent. L3-4: Chronic intervertebral disc space narrowing with bridging syndesmophytes. Moderate facet arthrosis. Mild spinal stenosis with bilateral L3 foraminal narrowing. L4-5: Chronic trace anterolisthesis. Partial ankylosis with bridging syndesmophyte aches per ring. Advanced facet arthrosis. No significant spinal stenosis. Moderate bilateral L4 foraminal narrowing, left greater than right. L5-S1: Transitional lumbosacral anatomy with sacralization of the L5 vertebral body and rudimentary L5-S1 disc. No significant stenosis. IMPRESSION: 1. No acute traumatic injury within the lumbar spine. 2. Levoscoliosis with associated mild to moderate multilevel degenerative spondylolysis and facet arthrosis as above, similar to previous. 3. Large volume stool impacted within the rectal vault. 4. Aortic atherosclerosis. Electronically Signed   By: Jeannine Boga M.D.   On: 04/30/2018 22:26   Dg Chest Port 1 View  Result Date: 04/30/2018 CLINICAL DATA:  Fall EXAM: PORTABLE CHEST 1 VIEW COMPARISON:  04/22/2017, CT cervical spine 06/21/2015, radiograph 05/13/2013 FINDINGS: No acute airspace disease or pleural effusion. Mild cardiomegaly with aortic atherosclerosis. No pneumothorax. Left upper chronic rib fractures. Prominent right paratracheal opacity, likely corresponding to CT demonstrated thyroid masses  IMPRESSION: 1. Mild cardiomegaly without acute opacity 2. Similar appearance of right paratracheal mass, presumably corresponding to thyroid mass on previous CT. Electronically Signed   By: Donavan Foil M.D.   On: 04/30/2018 20:45   Dg Knee Complete 4 Views Left  Result Date: 04/30/2018 CLINICAL DATA:  Fall EXAM: LEFT KNEE - COMPLETE 4+ VIEW COMPARISON:  None. FINDINGS: Bones appear osteopenic. Joint space calcifications. No fracture or malalignment. Moderate degenerative changes of the medial and lateral joint space with mild patellofemoral degenerative change. No knee effusion. Vascular calcification IMPRESSION: Degenerative changes without acute osseous abnormality Electronically Signed   By: Donavan Foil M.D.   On: 04/30/2018 20:47   Dg Femur Portable Min 2 Views Left  Result Date: 04/30/2018 CLINICAL DATA:  Fall EXAM: LEFT FEMUR PORTABLE 2 VIEWS COMPARISON:  None. FINDINGS: Acute slightly comminuted and displaced left intertrochanteric fracture. Mid to distal femur appear intact. Vascular calcifications. IMPRESSION: Acute slightly comminuted and displaced left intertrochanteric fracture Electronically Signed   By: Donavan Foil M.D.   On: 04/30/2018 20:46    EKG: Independently reviewed.  Regular rate and rhythm, no P waves appreciated.  Artifact in most leads.  Assessment/Plan Principal Problem:   Hip fracture (HCC) Active Problems:   AKI (acute kidney injury) ([place])   CKD (chronic kidney disease), stage III (HCC)   Abdominal hernia   Constipation   Chronic anemia   Left hip fracture secondary to  mechanical fall Imaging with evidence of evidence of acute comminuted intertrochanteric fracture of the left hip. Head and C-spine CT negative for acute abnormality.  X-rays of left knee, tibia, fibula, and ankle without acute abnormality.  There is evidence of degenerative changes in the knee.  In addition, evidence of levoscoliosis and multilevel degenerative spondylolysis of the lumbar  spine. -ED provider discussed the case with Dr. Inda Merlin from orthopedics.  Patient will be taken to the OR in the morning. -Keep n.p.o. after midnight.  Sips with meds okay. -Morphine 1 mg every 3 hours as needed for severe pain -IV fluid hydration  Mild AKI on CKD 3 Creatinine 1.2, baseline 0.7-0.9. -IV fluid hydration -Avoid nephrotoxic agents/contrast -Repeat BMP in a.m.  ?Atrial fibrillation No prior history of atrial fibrillation documented in the chart.  EKG showing Regular rate and rhythm, no P waves appreciated.  Artifact in most leads. -Cardiac monitoring -Repeat EKG in a.m.  Abdominal wall hernia CT abdomen pelvis with incidental finding of right lower quadrant abdominal wall hernia containing a portion of the right colon but without proximal obstruction; thought to be acute versus chronic.  Patient denies having any abdominal pain, nausea, or vomiting.  Reports having regular bowel movements and normal p.o. intake. -Continue to monitor  Constipation CT showing large volume stool impacted within the rectal vault.  Patient states her last bowel movement was yesterday. -Senokot-S and Miralax  Uterine abnormality CT abdomen pelvis showing a somewhat atrophic uterus with calcified fibroids.  In addition, showing abnormal fluid collection in the endometrium.  I have discussed these findings with the patient.  She expresses her desire to not have any additional testing done due to her advanced age.  Thyroid mass Chest x-ray showing similar appearance of right paratracheal mass, presumably corresponding to thyroid mass on previous CT. I have discussed these findings with the patient.  She expresses her desire to not have any additional testing done due to her advanced age.  Chronic anemia -Hemoglobin 10.5, baseline in the 11 range.  No signs of active bleeding. -Continue to monitor CBC  DVT prophylaxis: SCDs Code Status: Full code.  Patient does not want to discuss CODE STATUS at  this time. No family available at this time.  CODE STATUS needs to be discussed with the patient and family in the morning. Family Communication: No family available. Disposition Plan: Anticipate discharge 1 to 2 days after surgery. Consults called: Orthopedics Admission status: It is my clinical opinion that admission to INPATIENT is reasonable and necessary in this 85 y.o. female . presenting with symptoms of mechanical fall, left hip pain, concerning for left hip fracture . with pertinent positives on physical exam including: Left hip shortened and externally rotated, left hip pain . and pertinent positives on radiographic and laboratory data including: Imaging with evidence of left hip fracture . Workup and treatment include keeping n.p.o., IV fluid hydration, IV pain medication, and surgery in the morning.  Given the aforementioned, the predictability of an adverse outcome is felt to be significant. I expect that the patient will require at least 2 midnights in the hospital to treat this condition.    Shela Leff MD [facility] Pager [phone]  If 7PM-7AM, please contact night-coverage [email or web address] Password [facility]  05/01/2018, 1:57 AM

## 2018-05-01 NOTE — H&P (View-Only) (Signed)
Reason for Consult:left IT hip Fx, closed Referring Physician: Kyung Bacca MD   Hannah Arias is an 85 y.o. female.  HPI: 85 year old female who lives independently fell with a left intertrochanteric hip fracture.  She is had previous rotator cuff repair about 20 years ago on her left shoulder which was done by me.  Patient's alert oriented.  She had immediate pain and inability to ambulate after she fell.  X-rays demonstrated intertrochanteric hip fracture.  Past Medical History:  Diagnosis Date  . GERD (gastroesophageal reflux disease)     Past Surgical History:  Procedure Laterality Date  . APPENDECTOMY     85 yr old  . BREAST LUMPECTOMY    . CATARACT EXTRACTION    . ELBOW SURGERY    . HEMORROIDECTOMY    . HERNIA REPAIR    . SHOULDER SURGERY     left rotator cuff  . TONSILLECTOMY AND ADENOIDECTOMY     9 yr  . tubal removal      Family History  Problem Relation Age of Onset  . Cancer Other   . Diabetes Other     Social History:  reports that she has never smoked. She has never used smokeless tobacco. She reports that she does not drink alcohol. No history on file for drug.  Allergies:  Allergies  Allergen Reactions  . Aspirin     Stomach problem  . Codeine     hallucinate  . Penicillins Rash    Has patient had a PCN reaction causing immediate rash, facial/tongue/throat swelling, SOB or lightheadedness with hypotension: Yes Has patient had a PCN reaction causing severe rash involving mucus membranes or skin necrosis: No Has patient had a PCN reaction that required hospitalization No Has patient had a PCN reaction occurring within the last 10 years: No If all of the above answers are "NO", then may proceed with Cephalosporin use.     Medications: I have reviewed the patient's current medications.  Results for orders placed or performed during the hospital encounter of 04/30/18 (from the past 48 hour(s))  Sample to Blood Bank     Status: None   Collection Time:  04/30/18  8:00 PM  Result Value Ref Range   Blood Bank Specimen SAMPLE AVAILABLE FOR TESTING    Sample Expiration      05/01/2018 Performed at [facility], [place] [address] [place], [place] [address]   Comprehensive metabolic panel     Status: Abnormal   Collection Time: 04/30/18  8:03 PM  Result Value Ref Range   Sodium 140 135 - 145 mmol/L   Potassium 4.5 3.5 - 5.1 mmol/L   Chloride 107 98 - 111 mmol/L   CO2 27 22 - 32 mmol/L   Glucose, Bld 163 (H) 70 - 99 mg/dL   BUN 22 8 - 23 mg/dL   Creatinine, Ser 1.20 (H) 0.44 - 1.00 mg/dL   Calcium 8.8 (L) 8.9 - 10.3 mg/dL   Total Protein 5.9 (L) 6.5 - 8.1 g/dL   Albumin 3.4 (L) 3.5 - 5.0 g/dL   AST 17 15 - 41 U/L   ALT 10 0 - 44 U/L   Alkaline Phosphatase 51 38 - 126 U/L   Total Bilirubin 0.7 0.3 - 1.2 mg/dL   GFR calc non Af Amer 36 (L) >60 mL/min   GFR calc Af Amer 41 (L) >60 mL/min   Anion gap 6 5 - 15    Comment: Performed at [facility], [place]  [address], [place], [place] [address]  CBC     Status: Abnormal   Collection Time: 04/30/18  8:03 PM  Result Value Ref Range   WBC 6.6 4.0 - 10.5 K/uL   RBC 3.24 (L) 3.87 - 5.11 MIL/uL   Hemoglobin 10.5 (L) 12.0 - 15.0 g/dL   HCT 33.1 (L) 36.0 - 46.0 %   MCV 102.2 (H) 80.0 - 100.0 fL   MCH 32.4 26.0 - 34.0 pg   MCHC 31.7 30.0 - 36.0 g/dL   RDW 13.7 11.5 - 15.5 %   Platelets 150 150 - 400 K/uL   nRBC 0.0 0.0 - 0.2 %    Comment: Performed at [facility], [place] [address] [place], [place] [address]  Ethanol     Status: None   Collection Time: 04/30/18  8:03 PM  Result Value Ref Range   Alcohol, Ethyl (B) <10 <10 mg/dL    Comment: (NOTE) Lowest detectable limit for serum alcohol is 10 mg/dL. For medical purposes only. Performed at [facility], [place] [address] [place], [place] [address]   Protime-INR     Status: None   Collection Time: 04/30/18  8:03 PM  Result Value Ref Range   Prothrombin Time 13.3 11.4 - 15.2 seconds   INR 1.02     Comment: Performed  at [place] [address], [place], [place] [address]  I-Stat Chem 8, ED     Status: Abnormal   Collection Time: 04/30/18  8:11 PM  Result Value Ref Range   Sodium 140 135 - 145 mmol/L   Potassium 4.5 3.5 - 5.1 mmol/L   Chloride 103 98 - 111 mmol/L   BUN 24 (H) 8 - 23 mg/dL   Creatinine, Ser 1.10 (H) 0.44 - 1.00 mg/dL   Glucose, Bld 159 (H) 70 - 99 mg/dL   Calcium, Ion 1.10 (L) 1.15 - 1.40 mmol/L   TCO2 28 22 - 32 mmol/L   Hemoglobin 10.2 (L) 12.0 - 15.0 g/dL   HCT 30.0 (L) 36.0 - 46.0 %  I-Stat CG4 Lactic Acid, ED     Status: None   Collection Time: 04/30/18  8:12 PM  Result Value Ref Range   Lactic Acid, Venous 1.56 0.5 - 1.9 mmol/L  CDS serology     Status: None   Collection Time: 04/30/18  8:25 PM  Result Value Ref Range   CDS serology specimen      SPECIMEN WILL BE HELD FOR 14 DAYS IF TESTING IS REQUIRED    Comment: SPECIMEN WILL BE HELD FOR 14 DAYS IF TESTING IS REQUIRED SPECIMEN WILL BE HELD FOR 14 DAYS IF TESTING IS REQUIRED Performed at [facility], [place] [address], [place], [place] [address]   Urinalysis, Routine w reflex microscopic     Status: Abnormal   Collection Time: 04/30/18  8:47 PM  Result Value Ref Range   Color, Urine AMBER (A) YELLOW    Comment: BIOCHEMICALS MAY BE AFFECTED BY COLOR   APPearance HAZY (A) CLEAR   Specific Gravity, Urine 1.026 1.005 - 1.030   pH 5.0 5.0 - 8.0   Glucose, UA NEGATIVE NEGATIVE mg/dL   Hgb urine dipstick NEGATIVE NEGATIVE   Bilirubin Urine NEGATIVE NEGATIVE   Ketones, ur 5 (A) NEGATIVE mg/dL   Protein, ur 100 (A) NEGATIVE mg/dL   Nitrite NEGATIVE NEGATIVE   Leukocytes, UA SMALL (A) NEGATIVE   RBC / HPF 0-5 0 - 5 RBC/hpf   WBC, UA 21-50 0 -  5 WBC/hpf   Bacteria, UA RARE (A) NONE SEEN   Squamous Epithelial / LPF 0-5 0 - 5   Hyaline Casts, UA PRESENT     Comment: Performed at [facility], [place] [address] [place], [place] [address]  Troponin I - ONCE - STAT     Status: None   Collection Time:  04/30/18 11:38 PM  Result Value Ref Range   Troponin I <0.03 <0.03 ng/mL    Comment: Performed at [facility], [place] [address], [place], [place] [address]  Surgical PCR screen     Status: None   Collection Time: 05/01/18  3:27 AM  Result Value Ref Range   MRSA, PCR NEGATIVE NEGATIVE   Staphylococcus aureus NEGATIVE NEGATIVE    Comment: (NOTE) The Xpert SA Assay (FDA approved for NASAL specimens in patients 83 years of age and older), is one component of a comprehensive surveillance program. It is not intended to diagnose infection nor to guide or monitor treatment. Performed at [facility], [place] [address], [place], [place] [address]   CBC     Status: Abnormal   Collection Time: 05/01/18  4:56 AM  Result Value Ref Range   WBC 7.0 4.0 - 10.5 K/uL   RBC 2.75 (L) 3.87 - 5.11 MIL/uL   Hemoglobin 8.9 (L) 12.0 - 15.0 g/dL   HCT 27.4 (L) 36.0 - 46.0 %   MCV 99.6 80.0 - 100.0 fL   MCH 32.4 26.0 - 34.0 pg   MCHC 32.5 30.0 - 36.0 g/dL   RDW 13.8 11.5 - 15.5 %   Platelets 111 (L) 150 - 400 K/uL    Comment: REPEATED TO VERIFY SPECIMEN CHECKED FOR CLOTS Immature Platelet Fraction may be clinically indicated, consider ordering this additional test [id number] CONSISTENT WITH PREVIOUS RESULT    nRBC 0.0 0.0 - 0.2 %    Comment: Performed at [facility], [place] [address], [place], [place] [address]  Basic metabolic panel     Status: Abnormal   Collection Time: 05/01/18  4:56 AM  Result Value Ref Range   Sodium 139 135 - 145 mmol/L   Potassium 4.2 3.5 - 5.1 mmol/L   Chloride 104 98 - 111 mmol/L   CO2 29 22 - 32 mmol/L   Glucose, Bld 132 (H) 70 - 99 mg/dL   BUN 22 8 - 23 mg/dL   Creatinine, Ser 0.92 0.44 - 1.00 mg/dL   Calcium 8.3 (L) 8.9 - 10.3 mg/dL   GFR calc non Af Amer 49 (L) >60 mL/min   GFR calc Af Amer 57 (L) >60 mL/min   Anion gap 6 5 - 15    Comment: Performed at [place] [facility] [address] [place], [place] [address]    Dg Tibia/fibula  Left  Result Date: 04/30/2018 CLINICAL DATA:  Fall EXAM: LEFT TIBIA AND FIBULA - 2 VIEW COMPARISON:  None. FINDINGS: Joint space calcifications at the knee. No acute displaced fracture or malalignment. Diffuse soft tissue swelling. Vascular calcifications. IMPRESSION: No acute osseous abnormality.  Chondrocalcinosis at the knee Electronically Signed   By: Donavan Foil M.D.   On: 04/30/2018 20:50   Dg Ankle Complete Left  Result Date: 04/30/2018 CLINICAL DATA:  Fall with left-sided leg pain EXAM: LEFT ANKLE COMPLETE - 3+ VIEW COMPARISON:  None. FINDINGS: Diffuse soft tissue edema. Bones appear osteopenic. No fracture or malalignment. Moderate plantar calcaneal spur. IMPRESSION: No acute osseous abnormality. Electronically Signed   By: Donavan Foil  M.D.   On: 04/30/2018 20:51   Ct Head Wo Contrast  Result Date: 04/30/2018 CLINICAL DATA:  Slipped and fell EXAM: CT HEAD WITHOUT CONTRAST CT CERVICAL SPINE WITHOUT CONTRAST TECHNIQUE: Multidetector CT imaging of the head and cervical spine was performed following the standard protocol without intravenous contrast. Multiplanar CT image reconstructions of the cervical spine were also generated. COMPARISON:  None. FINDINGS: CT HEAD FINDINGS Brain: Diffuse parenchymal atrophy. Patchy areas of hypoattenuation in deep and periventricular white matter bilaterally. Negative for acute intracranial hemorrhage, mass lesion, acute infarction, midline shift, or mass-effect. Acute infarct may be inapparent on noncontrast CT. Ventricles and sulci symmetric. Bilateral basal ganglia mineralization. Vascular: Atherosclerotic and physiologic intracranial calcifications. Skull: Normal. Negative for fracture or focal lesion. Sinuses/Orbits: Mucoperiosteal thickening in the left maxillary sinus. Missing dentition. Other: Left frontal scalp soft tissue swelling. CT CERVICAL SPINE FINDINGS Alignment: Mild cervical dextroscoliosis. No significant spondylolisthesis, with some  degradation of reconstructions due to motion. Skull base and vertebrae: Negative for fracture or focal bone lesion. Soft tissues and spinal canal: No visible canal hematoma. No prevertebral fluid or gas. Disc levels: Asymmetric facet DJD C2-T1, left worse than right. Narrowing of interspaces C4-C7 with small endplate spurs. Upper chest: Negative. Other: Bilateral calcified carotid bifurcation plaque. IMPRESSION: 1. Negative for bleed or other acute intracranial process. 2. Atrophy and nonspecific white matter changes. 3. Negative for cervical fracture or other acute bone abnormality. 4. Multilevel cervical degenerative changes as above. Electronically Signed   By: Lucrezia Europe M.D.   On: 04/30/2018 21:29   Ct Cervical Spine Wo Contrast  Result Date: 04/30/2018 CLINICAL DATA:  Slipped and fell EXAM: CT HEAD WITHOUT CONTRAST CT CERVICAL SPINE WITHOUT CONTRAST TECHNIQUE: Multidetector CT imaging of the head and cervical spine was performed following the standard protocol without intravenous contrast. Multiplanar CT image reconstructions of the cervical spine were also generated. COMPARISON:  None. FINDINGS: CT HEAD FINDINGS Brain: Diffuse parenchymal atrophy. Patchy areas of hypoattenuation in deep and periventricular white matter bilaterally. Negative for acute intracranial hemorrhage, mass lesion, acute infarction, midline shift, or mass-effect. Acute infarct may be inapparent on noncontrast CT. Ventricles and sulci symmetric. Bilateral basal ganglia mineralization. Vascular: Atherosclerotic and physiologic intracranial calcifications. Skull: Normal. Negative for fracture or focal lesion. Sinuses/Orbits: Mucoperiosteal thickening in the left maxillary sinus. Missing dentition. Other: Left frontal scalp soft tissue swelling. CT CERVICAL SPINE FINDINGS Alignment: Mild cervical dextroscoliosis. No significant spondylolisthesis, with some degradation of reconstructions due to motion. Skull base and vertebrae: Negative  for fracture or focal bone lesion. Soft tissues and spinal canal: No visible canal hematoma. No prevertebral fluid or gas. Disc levels: Asymmetric facet DJD C2-T1, left worse than right. Narrowing of interspaces C4-C7 with small endplate spurs. Upper chest: Negative. Other: Bilateral calcified carotid bifurcation plaque. IMPRESSION: 1. Negative for bleed or other acute intracranial process. 2. Atrophy and nonspecific white matter changes. 3. Negative for cervical fracture or other acute bone abnormality. 4. Multilevel cervical degenerative changes as above. Electronically Signed   By: Lucrezia Europe M.D.   On: 04/30/2018 21:29   Ct Abdomen Pelvis W Contrast  Result Date: 04/30/2018 CLINICAL DATA:  Slip and fall injury. EXAM: CT ABDOMEN AND PELVIS WITH CONTRAST TECHNIQUE: Multidetector CT imaging of the abdomen and pelvis was performed using the standard protocol following bolus administration of intravenous contrast. CONTRAST:  137mL ISOVUE-370 IOPAMIDOL (ISOVUE-370) INJECTION 76% COMPARISON:  None. FINDINGS: Lower chest: Motion artifact limits evaluation. Linear atelectasis in the lung bases. Hepatobiliary: Motion artifact limits evaluation. Several stones  in the gallbladder. No definite gallbladder wall thickening or infiltration. No bile duct dilatation. Benign-appearing cyst in the left lobe of the liver measuring about 3.7 cm diameter. Pancreas: Atrophic appearance of the pancreas. No focal lesions identified. Spleen: Normal in size without focal abnormality. Adrenals/Urinary Tract: Adrenal glands are unremarkable. Kidneys are normal, without renal calculi, focal lesion, or hydronephrosis. Bladder is unremarkable. Stomach/Bowel: Stomach, small bowel, and colon are not abnormally distended. Stool throughout the colon. Prominent stool in the rectum. Right lower quadrant abdominal wall hernia consistent with spigelian hernia containing a portion of the right colon but without proximal obstruction.  Vascular/Lymphatic: Tortuous and calcified aorta without aneurysmal dilatation. Inferior vena cava is unremarkable. No significant lymphadenopathy. Reproductive: Somewhat atrophic uterus with calcified fibroids. Suggestion of fluid in the endometrium which would be abnormal in a patient of this age. Consider gynecology examination or follow-up pelvic ultrasound for further evaluation. Other: No free air or free fluid in the abdomen. Musculoskeletal: Degenerative changes throughout the lumbar spine. Comminuted inter trochanteric fractures of the left hip with varus angulation appear acute. No dislocation. Old appearing fracture deformities of the left superior and inferior pubic rami. IMPRESSION: 1. Acute comminuted inter trochanteric fractures of the left hip with varus angulation. No dislocation. 2. Cholelithiasis without evidence of cholecystitis. 3. Right lower quadrant abdominal wall hernia containing a portion of the right colon but without proximal obstruction. This could represent acute or chronic finding. 4. Somewhat atrophic uterus with calcified fibroids. Suggestion of fluid in the endometrium which would be abnormal in a patient of this age. Consider gynecology examination or follow-up pelvic ultrasound for further evaluation. Aortic Atherosclerosis (ICD10-I70.0). Electronically Signed   By: Lucienne Capers M.D.   On: 04/30/2018 21:40   Dg Pelvis Portable  Result Date: 04/30/2018 CLINICAL DATA:  Fall EXAM: PORTABLE PELVIS 1-2 VIEWS COMPARISON:  None. FINDINGS: Acute slightly comminuted fracture involving the left trochanter with cephalad migration of the distal femur. The left femoral head projects in joint. There may be additional left superior pubic ramus fracture. Moderate to marked arthritis of the right hip. IMPRESSION: 1. Acute displaced left trochanteric fracture 2. Possible acute left superior pubic ramus fracture Electronically Signed   By: Donavan Foil M.D.   On: 04/30/2018 20:49   Ct  L-spine No Charge  Result Date: 04/30/2018 CLINICAL DATA:  Initial evaluation for acute trauma, fall. EXAM: CT LUMBAR SPINE WITHOUT CONTRAST TECHNIQUE: Multidetector CT imaging of the lumbar spine was performed without intravenous contrast administration. Multiplanar CT image reconstructions were also generated. COMPARISON:  Concomitant CT of the abdomen and pelvis performed at the same time. Comparison also made with prior CT from 06/21/2015. FINDINGS: Segmentation: Transitional lumbosacral anatomy. For the purposes of this dictation the lowest rib-bearing vertebra will be labeled T12. L5 vertebral body sacralized with rudimentary L5-S1 joint. Alignment: Levoscoliosis with apex at L2-3. Trace chronic anterolisthesis of L4 on L5, stable. Vertebrae: Vertebral body heights maintained without evidence for acute or chronic fracture. Small chronic endplate Schmorl's nodes present at the superior endplates of L2 and L3. Visualized sacrum and pelvis intact. SI joints approximated and symmetric. No discrete osseous lesions. Diffuse osteopenia. Paraspinal and other soft tissues: Paraspinous soft tissues demonstrate no acute finding. Chronic fatty atrophy noted within the posterior paraspinous musculature. Large volume stool seen impacted within the rectal vault. Extensive aorto bi-iliac atherosclerotic disease. Disc levels: T12-L1: Chronic intervertebral disc space narrowing with endplate osteophytic spurring. Mild facet hypertrophy. No significant stenosis. L1-2: Chronic intervertebral disc space narrowing with mild diffuse disc  bulge and disc desiccation. Mild bilateral facet hypertrophy. No significant spinal stenosis. Moderate to severe right with mild left L1 foraminal stenosis. L2-3: Mild diffuse disc bulge with intervertebral disc space narrowing. Endplate osteophytic spurring anteriorly. Mild bilateral facet hypertrophy. No significant spinal stenosis. Foramina are patent. L3-4: Chronic intervertebral disc space  narrowing with bridging syndesmophytes. Moderate facet arthrosis. Mild spinal stenosis with bilateral L3 foraminal narrowing. L4-5: Chronic trace anterolisthesis. Partial ankylosis with bridging syndesmophyte aches per ring. Advanced facet arthrosis. No significant spinal stenosis. Moderate bilateral L4 foraminal narrowing, left greater than right. L5-S1: Transitional lumbosacral anatomy with sacralization of the L5 vertebral body and rudimentary L5-S1 disc. No significant stenosis. IMPRESSION: 1. No acute traumatic injury within the lumbar spine. 2. Levoscoliosis with associated mild to moderate multilevel degenerative spondylolysis and facet arthrosis as above, similar to previous. 3. Large volume stool impacted within the rectal vault. 4. Aortic atherosclerosis. Electronically Signed   By: Jeannine Boga M.D.   On: 04/30/2018 22:26   Dg Chest Port 1 View  Result Date: 04/30/2018 CLINICAL DATA:  Fall EXAM: PORTABLE CHEST 1 VIEW COMPARISON:  04/22/2017, CT cervical spine 06/21/2015, radiograph 05/13/2013 FINDINGS: No acute airspace disease or pleural effusion. Mild cardiomegaly with aortic atherosclerosis. No pneumothorax. Left upper chronic rib fractures. Prominent right paratracheal opacity, likely corresponding to CT demonstrated thyroid masses IMPRESSION: 1. Mild cardiomegaly without acute opacity 2. Similar appearance of right paratracheal mass, presumably corresponding to thyroid mass on previous CT. Electronically Signed   By: Donavan Foil M.D.   On: 04/30/2018 20:45   Dg Knee Complete 4 Views Left  Result Date: 04/30/2018 CLINICAL DATA:  Fall EXAM: LEFT KNEE - COMPLETE 4+ VIEW COMPARISON:  None. FINDINGS: Bones appear osteopenic. Joint space calcifications. No fracture or malalignment. Moderate degenerative changes of the medial and lateral joint space with mild patellofemoral degenerative change. No knee effusion. Vascular calcification IMPRESSION: Degenerative changes without acute osseous  abnormality Electronically Signed   By: Donavan Foil M.D.   On: 04/30/2018 20:47   Dg Femur Portable Min 2 Views Left  Result Date: 04/30/2018 CLINICAL DATA:  Fall EXAM: LEFT FEMUR PORTABLE 2 VIEWS COMPARISON:  None. FINDINGS: Acute slightly comminuted and displaced left intertrochanteric fracture. Mid to distal femur appear intact. Vascular calcifications. IMPRESSION: Acute slightly comminuted and displaced left intertrochanteric fracture Electronically Signed   By: Donavan Foil M.D.   On: 04/30/2018 20:46    ROS view of systems positive for increased age and 86, cranial fracture, history of acute kidney injury, carpal tunnel, history of congestive heart failure, stage III kidney disease previous olecranon fracture previous left rotator cuff repair otherwise negative is a pertains HPI. Blood pressure 124/66, pulse 79, temperature 98.4 F (36.9 C), temperature source Oral, resp. rate 18, SpO2 93 %. Physical Exam  Constitutional: She is oriented to person, place, and time. She appears well-developed and well-nourished.  HENT:  Head: Normocephalic.  Eyes: Pupils are equal, round, and reactive to light.  Neck: Normal range of motion. No tracheal deviation present. No thyromegaly present.  Cardiovascular: Normal rate.  Respiratory: Effort normal. No respiratory distress. She has no wheezes.  GI: Soft. Bowel sounds are normal.  Musculoskeletal:     Comments: Patient has some bilateral pedal edema.  Left lower extremity shortened externally rotated.  Skin over the hip is normal this is a closed fracture.  Distal pulse palpable good capillary refill.  Sciatic sensation is intact.  Neurological: She is alert and oriented to person, place, and time. She has normal reflexes.  Psychiatric: She has a normal mood and affect. Her behavior is normal. Judgment and thought content normal.    Assessment/Plan: Patient wishes to proceed with operative fixation of her hip so she can maintain independence.   Patient son is at the bedside.  Risk surgery discussed including those risk for her age risk of anesthesia potential for sudden death at 65.  Questions were elicited and answered she understands and request to proceed.  Plan trochanteric nail short Affixis nail stabilization.  Questions elicited and answered.  She understands request to proceed.  [name] 05/01/2018, 11:11 AM

## 2018-05-01 NOTE — Progress Notes (Signed)
Dr Ermalene Postin updated re BP and pt status. OK to tx to floor

## 2018-05-01 NOTE — Progress Notes (Signed)
Patient in Short Stay. RR 36; SpO2 91- 94% on RA. Patient reports mild Shob. Dr. Ermalene Postin notified. Patient placed on 2 lpm oxygen via [place].

## 2018-05-01 NOTE — Interval H&P Note (Signed)
History and Physical Interval Note:  05/01/2018 11:15 AM  Hannah Arias  has presented today for surgery, with the diagnosis of INTERTROCH FRACTURE, LEFT  The various methods of treatment have been discussed with the patient and family. After consideration of risks, benefits and other options for treatment, the patient has consented to  Procedure(s): LEFT BIOMET AFFIXUS (IM) SHORT NAIL, INTERTROCHANTRIC FRACTURE (Left) as a surgical intervention .  The patient's history has been reviewed, patient examined, no change in status, stable for surgery.  I have reviewed the patient's chart and labs.  Questions were answered to the patient's satisfaction.     [name]

## 2018-05-02 LAB — CBC WITH DIFFERENTIAL/PLATELET
Abs Immature Granulocytes: 0.04 10*3/uL (ref 0.00–0.07)
BASOS ABS: 0 10*3/uL (ref 0.0–0.1)
BASOS PCT: 1 %
Eosinophils Absolute: 0 10*3/uL (ref 0.0–0.5)
Eosinophils Relative: 1 %
HCT: 22.2 % — ABNORMAL LOW (ref 36.0–46.0)
Hemoglobin: 7.1 g/dL — ABNORMAL LOW (ref 12.0–15.0)
Immature Granulocytes: 1 %
Lymphocytes Relative: 16 %
Lymphs Abs: 1.3 10*3/uL (ref 0.7–4.0)
MCH: 32.4 pg (ref 26.0–34.0)
MCHC: 32 g/dL (ref 30.0–36.0)
MCV: 101.4 fL — ABNORMAL HIGH (ref 80.0–100.0)
Monocytes Absolute: 1.1 10*3/uL — ABNORMAL HIGH (ref 0.1–1.0)
Monocytes Relative: 14 %
NEUTROS ABS: 5.7 10*3/uL (ref 1.7–7.7)
Neutrophils Relative %: 67 %
PLATELETS: 98 10*3/uL — AB (ref 150–400)
RBC: 2.19 MIL/uL — AB (ref 3.87–5.11)
RDW: 14.1 % (ref 11.5–15.5)
WBC: 8.3 10*3/uL (ref 4.0–10.5)
nRBC: 0 % (ref 0.0–0.2)

## 2018-05-02 LAB — BASIC METABOLIC PANEL
Anion gap: 7 (ref 5–15)
BUN: 27 mg/dL — ABNORMAL HIGH (ref 8–23)
CO2: 27 mmol/L (ref 22–32)
Calcium: 8.1 mg/dL — ABNORMAL LOW (ref 8.9–10.3)
Chloride: 106 mmol/L (ref 98–111)
Creatinine, Ser: 1.08 mg/dL — ABNORMAL HIGH (ref 0.44–1.00)
GFR calc Af Amer: 47 mL/min — ABNORMAL LOW (ref >60)
GFR calc non Af Amer: 41 mL/min — ABNORMAL LOW (ref >60)
Glucose, Bld: 129 mg/dL — ABNORMAL HIGH (ref 70–99)
Potassium: 4.2 mmol/L (ref 3.5–5.1)
Sodium: 140 mmol/L (ref 135–145)

## 2018-05-02 NOTE — Progress Notes (Signed)
   Subjective: 1 Day Post-Op Procedure(s) (LRB): LEFT BIOMET AFFIXUS (IM) SHORT NAIL, INTERTROCHANTRIC FRACTURE (Left) Patient reports pain as mild.  It really does not hurt .  Objective: Vital signs in last 24 hours: Temp:  [97.7 F (36.5 C)-98.8 F (37.1 C)] 98.5 F (36.9 C) (01/12 0515) Pulse Rate:  [62-80] 77 (01/12 0515) Resp:  [15-27] 27 (01/12 0515) BP: (85-122)/(38-61) 116/52 (01/12 0515) SpO2:  [87 %-98 %] 90 % (01/12 0515)  Intake/Output from previous day: 01/11 0701 - 01/12 0700 In: 1555.6 [P.O.:200; I.V.:933.8; IV Piggyback:421.8] Out: 550 [Urine:525; Blood:25] Intake/Output this shift: No intake/output data recorded.  Recent Labs    04/30/18 2003 04/30/18 2011 05/01/18 0456 05/02/18 0448  HGB 10.5* 10.2* 8.9* 7.1*   Recent Labs    05/01/18 0456 05/02/18 0448  WBC 7.0 8.3  RBC 2.75* 2.19*  HCT 27.4* 22.2*  PLT 111* 98*   Recent Labs    05/01/18 0456 05/02/18 0448  NA 139 140  K 4.2 4.2  CL 104 106  CO2 29 27  BUN 22 27*  CREATININE 0.92 1.08*  GLUCOSE 132* 129*  CALCIUM 8.3* 8.1*   Recent Labs    04/30/18 2003  INR 1.02    Neurologically intact Dg C-arm 1-60 Min  Result Date: 05/01/2018 CLINICAL DATA:  ORIF of an intertrochanteric LEFT femoral neck fracture. EXAM: Operative HIP (WITH OR WITHOUT PELVIS) 2-3V LEFT COMPARISON:  04/30/2018. FINDINGS: 3 spot images from the C-arm fluoroscopic device, AP and LATERAL views of the LEFT hip are submitted for interpretation post-operatively. ORIF of the intertrochanteric femoral neck fracture with intramedullary nail and compression screw placement. Near anatomic alignment. The radiologic technologist documented 39 seconds of fluoroscopy time period IMPRESSION: Near anatomic alignment post ORIF of the intertrochanteric LEFT femoral neck fracture. Electronically Signed   By: Evangeline Dakin M.D.   On: 05/01/2018 15:07   Dg Hip Unilat With Pelvis 2-3 Views Left  Result Date: 05/01/2018 CLINICAL  DATA:  ORIF of an intertrochanteric LEFT femoral neck fracture. EXAM: Operative HIP (WITH OR WITHOUT PELVIS) 2-3V LEFT COMPARISON:  04/30/2018. FINDINGS: 3 spot images from the C-arm fluoroscopic device, AP and LATERAL views of the LEFT hip are submitted for interpretation post-operatively. ORIF of the intertrochanteric femoral neck fracture with intramedullary nail and compression screw placement. Near anatomic alignment. The radiologic technologist documented 39 seconds of fluoroscopy time period IMPRESSION: Near anatomic alignment post ORIF of the intertrochanteric LEFT femoral neck fracture. Electronically Signed   By: Evangeline Dakin M.D.   On: 05/01/2018 15:07    Assessment/Plan: 1 Day Post-Op Procedure(s) (LRB): LEFT BIOMET AFFIXUS (IM) SHORT NAIL, INTERTROCHANTRIC FRACTURE (Left)  Post  Op anemia , acute blood loss anemia.  Plan:  Up with PT.   Hannah Arias 05/02/2018, 1:00 PM

## 2018-05-02 NOTE — Progress Notes (Addendum)
PROGRESS NOTE  Hannah Arias YTK:[id number] DOB: May 20, 1910 DOA: 04/30/2018 PCP: Celene Squibb, MD  HPI/Recap of past 67 hours: 85 year old female with past medical history of GERD congestive heart failure diastolic admitted April 30, 2018 due to fall at home.  Patient is said to live by herself and was fully functional until her fall.  Patient just had repair of the left hip fracture.  Subjective: Patient seen and examined at bedtime.  She is complaining of pain in the hip at the site of surgery but she has been doing well otherwise she is being maintained on tramadol for pain.  She has been sleeping prior to this time apparently is time for her medication.  Her great granddaughter Caryl Pina is at bedside  Assessment/Plan: Principal Problem:   Hip fracture ([place]) Active Problems:   AKI (acute kidney injury) ([place])   CKD (chronic kidney disease), stage III (H[facility])   Abdominal hernia   Constipation   Chronic anemia  1.  Left hip fracture secondary to mechanical fall status post ORIF patient is stable postop day 1 orthopedic is following and recommending up with physical therapy  2.  Chronic kidney disease stage III.  Creatinine is at baseline we will continue gentle IV hydration as needed  3.  History of congestive heart failure which is stable we will hold her IV diuretics as her blood pressure was on the lower side.  4.  Mild transient hypo-tension.  Monitor and if needed we will give her gentle IV rehydration  5.  Chronic anemia versus acute blood loss anemia  versus dilutional.  Her hemoglobin is 7.1  today, we will continue to monitor may need transfusion if still low in the morning  6.  History of old right rotator cuff injury.  Patient is not a candidate for surgery at this time  Code Status: Full  Severity of Illness: The appropriate patient status for this patient is INPATIENT. Inpatient status is judged to be reasonable and necessary in order to provide the required  intensity of service to ensure the patient's safety. The patient's presenting symptoms, physical exam findings, and initial radiographic and laboratory data in the context of their chronic comorbidities is felt to place them at high risk for further clinical deterioration. Furthermore, it is not anticipated that the patient will be medically stable for discharge from the hospital within 2 midnights of admission. The following factors support the patient status of inpatient.   " The patient's presenting symptoms include hip pain status post fall with fracture status post internal fixation. " The worrisome physical exam findings include still complaining of pain. " The initial radiographic and laboratory data are worrisome because of hip fracture. " The chronic co-morbidities include age 89 and CHF   * I certify that at the point of admission it is my clinical judgment that the patient will require inpatient hospital care spanning beyond 2 midnights from the point of admission due to high intensity of service, high risk for further deterioration and high frequency of surveillance required.*    Family Communication: Great granddaughter Caryl Pina at bedside  Disposition Plan: Possibly SNF   Consultants:  Orthopedic  Procedures:  LEFT BIOMET AFFIXUS (IM) SHORT NAIL, INTERTROCHANTRIC FRACTURE (Left)   Antimicrobials:  None  DVT prophylaxis: SCD   Objective: Vitals:   05/02/18 0515 05/02/18 1341 05/02/18 1342 05/02/18 1454  BP: (!) 116/52  (!) 116/52 (!) 106/49  Pulse: 77  78 79  Resp: (!) 27  20 18   Temp:  98.5 F (36.9 C) 98 F (36.7 C) 98 F (36.7 C) 98.9 F (37.2 C)  TempSrc: Oral  Oral Oral  SpO2: 90%   94%    Intake/Output Summary (Last 24 hours) at 05/02/2018 1547 Last data filed at 05/02/2018 0445 Gross per 24 hour  Intake 800 ml  Output 300 ml  Net 500 ml   There were no vitals filed for this visit. There is no height or weight on file to calculate  BMI.  Exam:  . General: 85 y.o. year-old female well developed well nourished in no acute distress.  Alert and oriented x3. . Cardiovascular: Regular rate and rhythm with no rubs or gallops.  No thyromegaly or JVD noted.   Marland Kitchen Respiratory: Clear to auscultation with no wheezes or rales. Good inspiratory effort. . Abdomen: Soft nontender nondistended with normal bowel sounds x4 quadrants. . Musculoskeletal: +1 lower extremity edema. 2/4 pulses in all 4 extremities.  Tenderness to left hip at site of surgery.  Dressing is intact and no drainage . Skin: No ulcerative lesions noted or rashes, . Psychiatry: Mood is appropriate for condition and setting    Data Reviewed: CBC: Recent Labs  Lab 04/30/18 2003 04/30/18 2011 05/01/18 0456 05/02/18 0448  WBC 6.6  --  7.0 8.3  NEUTROABS  --   --   --  5.7  HGB 10.5* 10.2* 8.9* 7.1*  HCT 33.1* 30.0* 27.4* 22.2*  MCV 102.2*  --  99.6 101.4*  PLT 150  --  111* 98*   Basic Metabolic Panel: Recent Labs  Lab 04/30/18 2003 04/30/18 2011 05/01/18 0456 05/02/18 0448  NA 140 140 139 140  K 4.5 4.5 4.2 4.2  CL 107 103 104 106  CO2 27  --  29 27  GLUCOSE 163* 159* 132* 129*  BUN 22 24* 22 27*  CREATININE 1.20* 1.10* 0.92 1.08*  CALCIUM 8.8*  --  8.3* 8.1*   GFR: CrCl cannot be calculated (Unknown ideal weight.). Liver Function Tests: Recent Labs  Lab 04/30/18 2003  AST 17  ALT 10  ALKPHOS 51  BILITOT 0.7  PROT 5.9*  ALBUMIN 3.4*   No results for input(s): LIPASE, AMYLASE in the last 168 hours. No results for input(s): AMMONIA in the last 168 hours. Coagulation Profile: Recent Labs  Lab 04/30/18 2003  INR 1.02   Cardiac Enzymes: Recent Labs  Lab 04/30/18 2338  TROPONINI <0.03   BNP (last 3 results) No results for input(s): PROBNP in the last 8760 hours. HbA1C: No results for input(s): HGBA1C in the last 72 hours. CBG: No results for input(s): GLUCAP in the last 168 hours. Lipid Profile: No results for input(s):  CHOL, HDL, LDLCALC, TRIG, CHOLHDL, LDLDIRECT in the last 72 hours. Thyroid Function Tests: No results for input(s): TSH, T4TOTAL, FREET4, T3FREE, THYROIDAB in the last 72 hours. Anemia Panel: No results for input(s): VITAMINB12, FOLATE, FERRITIN, TIBC, IRON, RETICCTPCT in the last 72 hours. Urine analysis:    Component Value Date/Time   COLORURINE AMBER (A) 04/30/2018 2047   APPEARANCEUR HAZY (A) 04/30/2018 2047   LABSPEC 1.026 04/30/2018 2047   PHURINE 5.0 04/30/2018 2047   GLUCOSEU NEGATIVE 04/30/2018 2047   HGBUR NEGATIVE 04/30/2018 2047   BILIRUBINUR NEGATIVE 04/30/2018 2047   KETONESUR 5 (A) 04/30/2018 2047   PROTEINUR 100 (A) 04/30/2018 2047   UROBILINOGEN 0.2 05/11/2013 1548   NITRITE NEGATIVE 04/30/2018 2047   LEUKOCYTESUR SMALL (A) 04/30/2018 2047   Sepsis Labs: @LABRCNTIP (procalcitonin:4,lacticidven:4)  ) Recent Results (from the past 240  hour(s))  Surgical PCR screen     Status: None   Collection Time: 05/01/18  3:27 AM  Result Value Ref Range Status   MRSA, PCR NEGATIVE NEGATIVE Final   Staphylococcus aureus NEGATIVE NEGATIVE Final    Comment: (NOTE) The Xpert SA Assay (FDA approved for NASAL specimens in patients 35 years of age and older), is one component of a comprehensive surveillance program. It is not intended to diagnose infection nor to guide or monitor treatment. Performed at [facility], [place] [address], [place], [place] [address]       Studies: No results found.  Scheduled Meds: . mupirocin ointment  1 application Nasal BID  . pantoprazole  40 mg Oral Daily  . polyethylene glycol  17 g Oral Daily  . povidone-iodine  2 application Topical Once  . senna-docusate  1 tablet Oral BID    Continuous Infusions: . lactated ringers 10 mL/hr at 05/01/18 2259     LOS: 2 days     Cristal Deer, MD [facility]  To reach me or the doctor on call, go to: [email or web address] Password [facility]  05/02/2018, 3:47 PM

## 2018-05-02 NOTE — NC FL2 (Signed)
[place] LEVEL OF CARE SCREENING TOOL     IDENTIFICATION  Patient Name: Hannah Arias Birthdate: Jul 07, 1910 Sex: female Admission Date (Current Location): 04/30/2018  [facility] and [place] Number:  Herbalist and Address:  [facility] [place], [place] [address] [place], [place] [address]      Provider Number: [id number]  Attending Physician Name and Address:  Cristal Deer, MD  Relative Name and Phone Number:       Current Level of Care: Hospital Recommended Level of Care: [place] Prior Approval Number:    Date Approved/Denied:   PASRR Number: [id number]  Discharge Plan: SNF    Current Diagnoses: Patient Active Problem List   Diagnosis Date Noted  . AKI (acute kidney injury) ([place]) 05/01/2018  . CKD (chronic kidney disease), stage III ([place]) 05/01/2018  . Abdominal hernia 05/01/2018  . Constipation 05/01/2018  . Chronic anemia 05/01/2018  . Hip fracture ([place]) 04/30/2018  . Hypoxia 05/11/2013  . CHF (congestive heart failure) ([place]) 05/11/2013  . CTS (carpal tunnel syndrome) 03/04/2011  . CARPAL TUNNEL SYNDROME, RIGHT 03/19/2010  . RUPTURE ROTATOR CUFF 03/19/2010  . CLOSED FRACTURE OF OLECRANON PROCESS OF ULNA 11/07/2009    Orientation RESPIRATION BLADDER Height & Weight     Self, Time, Situation, Place  O2(Nasal Cannula 3L) Continent Weight:   Height:     BEHAVIORAL SYMPTOMS/MOOD NEUROLOGICAL BOWEL NUTRITION STATUS      Continent Diet(heart healthy, thin liquids)  AMBULATORY STATUS COMMUNICATION OF NEEDS Skin   Limited Assist Verbally Surgical wounds(closed incision left hip, ABD;Gauze )                       Personal Care Assistance Level of Assistance  Bathing, Feeding, Dressing Bathing Assistance: Limited assistance Feeding assistance: Limited assistance Dressing Assistance: Limited assistance     Functional Limitations Info  Sight, Hearing, Speech Sight Info: Adequate Hearing Info:  Adequate Speech Info: Adequate    SPECIAL CARE FACTORS FREQUENCY  PT (By licensed PT), OT (By licensed OT)                    Contractures Contractures Info: Not present    Additional Factors Info  Code Status, Allergies Code Status Info: Full code Allergies Info: Aspirin, Codeine, Penicillins           Current Medications (05/02/2018):  This is the current hospital active medication list Current Facility-Administered Medications  Medication Dose Route Frequency Provider Last Rate Last Dose  . albuterol (PROVENTIL) (2.5 MG/3ML) 0.083% nebulizer solution 2.5 mg  2.5 mg Nebulization Q6H PRN Shela Leff, MD      . lactated ringers infusion   Intravenous Continuous Oleta Mouse, MD 10 mL/hr at 05/01/18 2259    . morphine 2 MG/ML injection 1 mg  1 mg Intravenous Q3H PRN Shela Leff, MD   1 mg at 05/01/18 2118  . mupirocin ointment (BACTROBAN) 2 % 1 application  1 application Nasal BID Alla Feeling, Mc[place], MD   1 application at 93/81/01 1000  . pantoprazole (PROTONIX) EC tablet 40 mg  40 mg Oral Daily Shela Leff, MD   40 mg at 05/02/18 1100  . polyethylene glycol (MIRALAX / GLYCOLAX) packet 17 g  17 g Oral Daily Shela Leff, MD   17 g at 05/02/18 1000  . povidone-iodine 10 % swab 2 application  2 application Topical Once Shela Leff, MD      . senna-docusate (Senokot-S) tablet 1 tablet  1 tablet Oral BID Shela Leff, MD   1 tablet at 05/02/18 1100     Discharge Medications: Please see discharge summary for a list of discharge medications.  Relevant Imaging Results:  Relevant Lab Results:   Additional Information SSN: [id number]  Eileen Stanford, LCSW

## 2018-05-03 ENCOUNTER — Inpatient Hospital Stay (HOSPITAL_COMMUNITY): Payer: Medicare HMO

## 2018-05-03 ENCOUNTER — Encounter (HOSPITAL_COMMUNITY): Payer: Self-pay | Admitting: Orthopaedic Surgery

## 2018-05-03 DIAGNOSIS — R0600 Dyspnea, unspecified: Secondary | ICD-10-CM

## 2018-05-03 DIAGNOSIS — R0902 Hypoxemia: Secondary | ICD-10-CM

## 2018-05-03 DIAGNOSIS — S72142A Displaced intertrochanteric fracture of left femur, initial encounter for closed fracture: Principal | ICD-10-CM

## 2018-05-03 DIAGNOSIS — D62 Acute posthemorrhagic anemia: Secondary | ICD-10-CM

## 2018-05-03 LAB — CBC WITH DIFFERENTIAL/PLATELET
Abs Immature Granulocytes: 0.08 10*3/uL — ABNORMAL HIGH (ref 0.00–0.07)
Basophils Absolute: 0.1 10*3/uL (ref 0.0–0.1)
Basophils Relative: 1 %
Eosinophils Absolute: 0 10*3/uL (ref 0.0–0.5)
Eosinophils Relative: 0 %
HCT: 21.2 % — ABNORMAL LOW (ref 36.0–46.0)
Hemoglobin: 6.6 g/dL — CL (ref 12.0–15.0)
Immature Granulocytes: 1 %
Lymphocytes Relative: 11 %
Lymphs Abs: 1.2 10*3/uL (ref 0.7–4.0)
MCH: 31.6 pg (ref 26.0–34.0)
MCHC: 31.1 g/dL (ref 30.0–36.0)
MCV: 101.4 fL — ABNORMAL HIGH (ref 80.0–100.0)
Monocytes Absolute: 1.4 10*3/uL — ABNORMAL HIGH (ref 0.1–1.0)
Monocytes Relative: 13 %
Neutro Abs: 7.7 10*3/uL (ref 1.7–7.7)
Neutrophils Relative %: 74 %
Platelets: 99 10*3/uL — ABNORMAL LOW (ref 150–400)
RBC: 2.09 MIL/uL — ABNORMAL LOW (ref 3.87–5.11)
RDW: 14.1 % (ref 11.5–15.5)
WBC: 10.4 10*3/uL (ref 4.0–10.5)
nRBC: 0 % (ref 0.0–0.2)

## 2018-05-03 LAB — BASIC METABOLIC PANEL
Anion gap: 6 (ref 5–15)
BUN: 33 mg/dL — ABNORMAL HIGH (ref 8–23)
CO2: 27 mmol/L (ref 22–32)
Calcium: 8 mg/dL — ABNORMAL LOW (ref 8.9–10.3)
Chloride: 104 mmol/L (ref 98–111)
Creatinine, Ser: 1.16 mg/dL — ABNORMAL HIGH (ref 0.44–1.00)
GFR calc Af Amer: 43 mL/min — ABNORMAL LOW (ref >60)
GFR calc non Af Amer: 37 mL/min — ABNORMAL LOW (ref >60)
Glucose, Bld: 136 mg/dL — ABNORMAL HIGH (ref 70–99)
Potassium: 4.3 mmol/L (ref 3.5–5.1)
Sodium: 137 mmol/L (ref 135–145)

## 2018-05-03 LAB — CBC
HCT: 30 % — ABNORMAL LOW (ref 36.0–46.0)
Hemoglobin: 9.6 g/dL — ABNORMAL LOW (ref 12.0–15.0)
MCH: 30.8 pg (ref 26.0–34.0)
MCHC: 32 g/dL (ref 30.0–36.0)
MCV: 96.2 fL (ref 80.0–100.0)
PLATELETS: 107 10*3/uL — AB (ref 150–400)
RBC: 3.12 MIL/uL — ABNORMAL LOW (ref 3.87–5.11)
RDW: 15.9 % — ABNORMAL HIGH (ref 11.5–15.5)
WBC: 12.8 10*3/uL — ABNORMAL HIGH (ref 4.0–10.5)
nRBC: 0 % (ref 0.0–0.2)

## 2018-05-03 LAB — ABO/RH: ABO/RH(D): A NEG

## 2018-05-03 LAB — BRAIN NATRIURETIC PEPTIDE: B Natriuretic Peptide: 118.9 pg/mL — ABNORMAL HIGH (ref 0.0–100.0)

## 2018-05-03 LAB — PREPARE RBC (CROSSMATCH)

## 2018-05-03 LAB — PROCALCITONIN: Procalcitonin: 0.13 ng/mL

## 2018-05-03 MED ORDER — IOHEXOL 300 MG/ML  SOLN
100.0000 mL | Freq: Once | INTRAMUSCULAR | Status: AC | PRN
  Administered 2018-05-03: 100 mL via INTRAVENOUS

## 2018-05-03 MED ORDER — ACETAMINOPHEN 325 MG PO TABS
650.0000 mg | ORAL_TABLET | Freq: Four times a day (QID) | ORAL | Status: DC | PRN
  Administered 2018-05-04 – 2018-05-06 (×6): 650 mg via ORAL
  Filled 2018-05-03 (×7): qty 2

## 2018-05-03 MED ORDER — BISACODYL 10 MG RE SUPP
10.0000 mg | Freq: Every day | RECTAL | Status: AC
  Administered 2018-05-03 – 2018-05-04 (×2): 10 mg via RECTAL
  Filled 2018-05-03 (×2): qty 1

## 2018-05-03 MED ORDER — SODIUM CHLORIDE 0.9% IV SOLUTION
Freq: Once | INTRAVENOUS | Status: AC
  Administered 2018-05-03: 06:00:00 via INTRAVENOUS

## 2018-05-03 NOTE — Progress Notes (Signed)
PROGRESS NOTE    Hannah Arias   CLE:[id number]  DOB: Sep 24, 1910  DOA: 04/30/2018 PCP: Celene Squibb, MD   Brief Narrative:  Hannah Arias  is a 85 y.o. female who lives alone with medical history significant of chronic diastolic congestive heart failure, GERD presenting to the hospital via EMS after falling at home and experiencing left hip and knee pain. Nursing staff noted dried feces at the bottom of her feet. Found to have a left acute comminuted intertrochanteric fracture, mild AKI, CT showing large volume stool impacted within the rectal vault. She underwent left intertrochanteric nail placement on 1/11.   Subjective: She has no complaints. Noted to be breathing rapidly but does not feel short of breath.   Assessment & Plan:   Principal Problem:   Hip fracture ([place]) - per ortho- PT eval pending  Active Problems: Dyspnea and hypoxia - pulse ox in mid 80s on room air- RR in 30s- CXR shows not infiltrates, not wheezing, no h/o COPD, no fever - check CTA stat    Chronic anemia   Acute blood loss anemia - Hb baseline is about 10-11- Hb has dropped to 6.6 today- 2 U PRBC being transfused today  Chronic thrombocytopenia - mild chronic thrombocytopenia (platelets ~ 120 in 2015) - currently platelets ~ 100 -     CKD (chronic kidney disease), stage III  - stable    Constipation - impacted stool on CT- on Senna and Miralax daily but no BM as of yet - start Dulcolax daily x 2  Chronic diastolic CHF - grade 1 per ECHO in 2015- not on diuretics at home  Right paratracheal mass, atrophic uterus with calcified fibroids  Time spent in minutes: 45 DVT prophylaxis: SCDS Code Status: full code Family Communication:  Disposition Plan: awaiting PT eval Consultants:   ortho Procedures:  1/11- Left trochanteric nail, short 11 mm diameter with 105 mm lag screw and interlock distal screw Affixis Antimicrobials:  Anti-infectives (From admission, onward)   Start     Dose/Rate  Route Frequency Ordered Stop   05/01/18 0600  ceFAZolin (ANCEF) IVPB 2g/100 mL premix     2 g 200 mL/hr over 30 Minutes Intravenous On call to O.R. 05/01/18 0039 05/01/18 0922       Objective: Vitals:   05/02/18 2201 05/03/18 0535 05/03/18 0751 05/03/18 0822  BP: (!) 124/49 119/70 (!) 120/55 (!) 128/58  Pulse: 81 85 79 80  Resp: (!) 28 (!) 24 20 20   Temp: 98.2 F (36.8 C) 99.2 F (37.3 C) 98.6 F (37 C) 98.3 F (36.8 C)  TempSrc: Oral Oral Oral Oral  SpO2: 93%  94% 95%    Intake/Output Summary (Last 24 hours) at 05/03/2018 0846 Last data filed at 05/03/2018 0552 Gross per 24 hour  Intake -  Output 450 ml  Net -450 ml   There were no vitals filed for this visit.  Examination: General exam: Appears comfortable -  HEENT: PERRLA, oral mucosa dry, no sclera icterus or thrush- very hard of hearing Respiratory system: Clear to auscultation. Respiratory effort normal. Cardiovascular system: S1 & S2 heard, RRR. 2/6 murmur at apex  Gastrointestinal system: Abdomen soft, non-tender, nondistended. Normal bowel sounds. Central nervous system: Alert and oriented. No focal neurological deficits. Extremities: No cyanosis, clubbing  - left thigh swollen, mild pedal edema Skin: No rashes or ulcers Psychiatry:  Mood & affect appropriate.     Data Reviewed: I have personally reviewed following labs and imaging studies  CBC: Recent  Labs  Lab 04/30/18 2003 04/30/18 2011 05/01/18 0456 05/02/18 0448 05/03/18 0219  WBC 6.6  --  7.0 8.3 10.4  NEUTROABS  --   --   --  5.7 7.7  HGB 10.5* 10.2* 8.9* 7.1* 6.6*  HCT 33.1* 30.0* 27.4* 22.2* 21.2*  MCV 102.2*  --  99.6 101.4* 101.4*  PLT 150  --  111* 98* 99*   Basic Metabolic Panel: Recent Labs  Lab 04/30/18 2003 04/30/18 2011 05/01/18 0456 05/02/18 0448 05/03/18 0219  NA 140 140 139 140 137  K 4.5 4.5 4.2 4.2 4.3  CL 107 103 104 106 104  CO2 27  --  29 27 27   GLUCOSE 163* 159* 132* 129* 136*  BUN 22 24* 22 27* 33*    CREATININE 1.20* 1.10* 0.92 1.08* 1.16*  CALCIUM 8.8*  --  8.3* 8.1* 8.0*   GFR: CrCl cannot be calculated (Unknown ideal weight.). Liver Function Tests: Recent Labs  Lab 04/30/18 2003  AST 17  ALT 10  ALKPHOS 51  BILITOT 0.7  PROT 5.9*  ALBUMIN 3.4*   No results for input(s): LIPASE, AMYLASE in the last 168 hours. No results for input(s): AMMONIA in the last 168 hours. Coagulation Profile: Recent Labs  Lab 04/30/18 2003  INR 1.02   Cardiac Enzymes: Recent Labs  Lab 04/30/18 2338  TROPONINI <0.03   BNP (last 3 results) No results for input(s): PROBNP in the last 8760 hours. HbA1C: No results for input(s): HGBA1C in the last 72 hours. CBG: No results for input(s): GLUCAP in the last 168 hours. Lipid Profile: No results for input(s): CHOL, HDL, LDLCALC, TRIG, CHOLHDL, LDLDIRECT in the last 72 hours. Thyroid Function Tests: No results for input(s): TSH, T4TOTAL, FREET4, T3FREE, THYROIDAB in the last 72 hours. Anemia Panel: No results for input(s): VITAMINB12, FOLATE, FERRITIN, TIBC, IRON, RETICCTPCT in the last 72 hours. Urine analysis:    Component Value Date/Time   COLORURINE AMBER (A) 04/30/2018 2047   APPEARANCEUR HAZY (A) 04/30/2018 2047   LABSPEC 1.026 04/30/2018 2047   PHURINE 5.0 04/30/2018 2047   GLUCOSEU NEGATIVE 04/30/2018 2047   HGBUR NEGATIVE 04/30/2018 2047   BILIRUBINUR NEGATIVE 04/30/2018 2047   KETONESUR 5 (A) 04/30/2018 2047   PROTEINUR 100 (A) 04/30/2018 2047   UROBILINOGEN 0.2 05/11/2013 1548   NITRITE NEGATIVE 04/30/2018 2047   LEUKOCYTESUR SMALL (A) 04/30/2018 2047   Sepsis Labs: @LABRCNTIP (procalcitonin:4,lacticidven:4) ) Recent Results (from the past 240 hour(s))  Surgical PCR screen     Status: None   Collection Time: 05/01/18  3:27 AM  Result Value Ref Range Status   MRSA, PCR NEGATIVE NEGATIVE Final   Staphylococcus aureus NEGATIVE NEGATIVE Final    Comment: (NOTE) The Xpert SA Assay (FDA approved for NASAL specimens in  patients 44 years of age and older), is one component of a comprehensive surveillance program. It is not intended to diagnose infection nor to guide or monitor treatment. Performed at [facility], [place] [address], [place], [place] [address]          Radiology Studies: Dg C-arm 1-60 Min  Result Date: 05/01/2018 CLINICAL DATA:  ORIF of an intertrochanteric LEFT femoral neck fracture. EXAM: Operative HIP (WITH OR WITHOUT PELVIS) 2-3V LEFT COMPARISON:  04/30/2018. FINDINGS: 3 spot images from the C-arm fluoroscopic device, AP and LATERAL views of the LEFT hip are submitted for interpretation post-operatively. ORIF of the intertrochanteric femoral neck fracture with intramedullary nail and compression screw placement. Near anatomic alignment. The radiologic technologist documented 60  seconds of fluoroscopy time period IMPRESSION: Near anatomic alignment post ORIF of the intertrochanteric LEFT femoral neck fracture. Electronically Signed   By: Evangeline Dakin M.D.   On: 05/01/2018 15:07   Dg Hip Unilat With Pelvis 2-3 Views Left  Result Date: 05/01/2018 CLINICAL DATA:  ORIF of an intertrochanteric LEFT femoral neck fracture. EXAM: Operative HIP (WITH OR WITHOUT PELVIS) 2-3V LEFT COMPARISON:  04/30/2018. FINDINGS: 3 spot images from the C-arm fluoroscopic device, AP and LATERAL views of the LEFT hip are submitted for interpretation post-operatively. ORIF of the intertrochanteric femoral neck fracture with intramedullary nail and compression screw placement. Near anatomic alignment. The radiologic technologist documented 39 seconds of fluoroscopy time period IMPRESSION: Near anatomic alignment post ORIF of the intertrochanteric LEFT femoral neck fracture. Electronically Signed   By: Evangeline Dakin M.D.   On: 05/01/2018 15:07      Scheduled Meds: . mupirocin ointment  1 application Nasal BID  . pantoprazole  40 mg Oral Daily  . polyethylene glycol  17 g Oral Daily  . povidone-iodine  2  application Topical Once  . senna-docusate  1 tablet Oral BID   Continuous Infusions: . lactated ringers 10 mL/hr at 05/03/18 0533     LOS: 3 days      Debbe Odea, MD [facility] Pager: [email or web address] Password [facility] 05/03/2018, 8:46 AM

## 2018-05-03 NOTE — Progress Notes (Signed)
Pt's Hannah Arias, Hannah Arias, brought in [place] papers today showing that he is the POA. Copy made and placed on chart. POA would like to talk about making pt a DNR. MD notified. His number is on the chart.

## 2018-05-03 NOTE — Progress Notes (Signed)
   Subjective: 2 Days Post-Op Procedure(s) (LRB): LEFT BIOMET AFFIXUS (IM) SHORT NAIL, INTERTROCHANTRIC FRACTURE (Left) Patient reports pain as 0 on 0-10 scale.  Getting blood. Mild dypnea.   Objective: Vital signs in last 24 hours: Temp:  [98 F (36.7 C)-99.2 F (37.3 C)] 99 F (37.2 C) (01/13 1206) Pulse Rate:  [78-85] 81 (01/13 1206) Resp:  [18-28] 24 (01/13 1206) BP: (106-130)/(49-74) 121/52 (01/13 1206) SpO2:  [87 %-95 %] 90 % (01/13 1206)  Intake/Output from previous day: 01/12 0701 - 01/13 0700 In: -  Out: 450 [Urine:450] Intake/Output this shift: Total I/O In: 713.3 [Blood:713.3] Out: -   Recent Labs    04/30/18 2003 04/30/18 2011 05/01/18 0456 05/02/18 0448 05/03/18 0219  HGB 10.5* 10.2* 8.9* 7.1* 6.6*   Recent Labs    05/02/18 0448 05/03/18 0219  WBC 8.3 10.4  RBC 2.19* 2.09*  HCT 22.2* 21.2*  PLT 98* 99*   Recent Labs    05/02/18 0448 05/03/18 0219  NA 140 137  K 4.2 4.3  CL 106 104  CO2 27 27  BUN 27* 33*  CREATININE 1.08* 1.16*  GLUCOSE 129* 136*  CALCIUM 8.1* 8.0*   Recent Labs    04/30/18 2003  INR 1.02  Hgb 6.6 at 2 PM   Neurologically intact,   Has some ankle edema.  Dg Chest Port 1 View  Result Date: 05/03/2018 CLINICAL DATA:  85 year old female with hypoxia. Subsequent encounter. EXAM: PORTABLE CHEST 1 VIEW COMPARISON:  04/30/2008 and 04/22/2017 chest x-ray. FINDINGS: Cardiomegaly. Calcified tortuous aorta. Central pulmonary vascular prominence without pulmonary edema. Right paratracheal fullness unchanged. No segmental consolidation or pneumothorax. Bilateral shoulder joint degenerative changes. IMPRESSION: 1. No segmental consolidation or pulmonary edema. 2.  Aortic Atherosclerosis (ICD10-I70.0). Calcified tortuous aorta. 3. Prominence right paratracheal region unchanged and may be secondary to vasculature or substernal extension of enlarged thyroid gland. 4. Cardiomegaly. Electronically Signed   By: Genia Del M.D.   On:  05/03/2018 09:38    Assessment/Plan: 2 Days Post-Op Procedure(s) (LRB): LEFT BIOMET AFFIXUS (IM) SHORT NAIL, INTERTROCHANTRIC FRACTURE (Left)  Acute blood loss anemia due to hip fracture and surgery Up with therapy after transfusion.   [name] 05/03/2018, 12:13 PM

## 2018-05-03 NOTE — Progress Notes (Signed)
PT Cancellation Note  Patient Details Name: NYIAH PIANKA MRN: [id number] DOB: 05-27-1910   Cancelled Treatment:    Reason Eval/Treat Not Completed: (P) Medical issues which prohibited therapy Pt Hgb 6.6. PT will follow back for evaluation when pt is medically appropriate.   Alvera Tourigny B. Migdalia Dk PT, DPT Acute Rehabilitation Services Pager [phone] Office [phone]    [place] 05/03/2018, 8:19 AM

## 2018-05-03 NOTE — Care Management Important Message (Signed)
Important Message  Patient Details  Name: Hannah Arias MRN: [id number] Date of Birth: 25-May-1910   Medicare Important Message Given:  Yes    [name] Montine Circle 05/03/2018, 4:13 PM

## 2018-05-03 NOTE — Progress Notes (Signed)
Patient has critical Hgb of 6.6 results were reported to on call physician at [facility].

## 2018-05-03 NOTE — Evaluation (Signed)
Physical Therapy Evaluation Patient Details Name: Hannah Arias MRN: [id number] DOB: [birth year] Today's Date: 05/03/2018   History of Present Illness  85 year old female has been healthy and is in independent living with good mental acuity slipped fell suffering a left closed intertrochanteric hip fracture. s/p IM nail 05/02/18. L WBAT  Clinical Impression  PTA pt lived alone in single story home, with ramped entrance, ambulating with RW. Son and daughter in-law assist with iADLS. Pt currently limited by increased pain and associated decreased strength and ROM in L hip. Pt requires mod-maxAx2 for bed mobility and was not able to attain fully upright with sit>stand due to pain. PT recommends SNF level rehab at d/c before returning home. PT will continue to follow acutely.     Follow Up Recommendations SNF    Equipment Recommendations  Other (comment)(TBD at next venue)    Recommendations for Other Services OT consult     Precautions / Restrictions Precautions Precautions: Fall Precaution Comments: fx result of fall Restrictions Weight Bearing Restrictions: Yes LLE Weight Bearing: Non weight bearing      Mobility  Bed Mobility Overal bed mobility: Needs Assistance Bed Mobility: Supine to Sit;Sit to Supine     Supine to sit: +2 for physical assistance;Mod assist Sit to supine: Max assist;+2 for physical assistance   General bed mobility comments: modAx2 for bringing LE of bed, trunk to upright and pad scoot of hips to EoB  Transfers Overall transfer level: Needs assistance Equipment used: Rolling walker (2 wheeled) Transfers: Sit to/from Stand Sit to Stand: Total assist;+2 physical assistance         General transfer comment: pt attempted to come to standing however unable to achieve upright due to decreased posterio pelvic tilt pt with 3/4 DoE after attempt and returned to bed.  Ambulation/Gait             General Gait Details: unable      Balance Overall  balance assessment: Needs assistance Sitting-balance support: Bilateral upper extremity supported;Feet supported Sitting balance-Leahy Scale: Poor Sitting balance - Comments: requires support to maintain balance                                     Pertinent Vitals/Pain Pain Assessment: Faces Faces Pain Scale: Hurts whole lot Pain Location: L hip with movement Pain Descriptors / Indicators: Grimacing;Guarding;Operative site guarding Pain Intervention(s): Limited activity within patient's tolerance;Monitored during session;Repositioned    Home Living Family/patient expects to be discharged to:: Private residence Living Arrangements: Alone Available Help at Discharge: Family;Available PRN/intermittently Type of Home: House Home Access: Stairs to enter;Ramped entrance   Entrance Stairs-Number of Steps: 1(1 step to ramp) Home Layout: One level Home Equipment: Walker - 2 wheels;Tub bench      Prior Function Level of Independence: Needs assistance   Gait / Transfers Assistance Needed: uses RW for ambulation   ADL's / Homemaking Assistance Needed: daughter in-law assists with bathing and family brings in hot meals        Hand Dominance        Extremity/Trunk Assessment   Upper Extremity Assessment Upper Extremity Assessment: Generalized weakness(decreased shoulder flexion bilaterally, crepitus with moveme)    Lower Extremity Assessment Lower Extremity Assessment: LLE deficits/detail;RLE deficits/detail RLE Deficits / Details: strength grossly assessed in mobility at 3/5 LLE Deficits / Details: decreased AROM secondary to pain,  LLE: Unable to fully assess due to pain    Cervical /  Trunk Assessment Cervical / Trunk Assessment: Kyphotic  Communication   Communication: HOH  Cognition Arousal/Alertness: Lethargic Behavior During Therapy: Flat affect Overall Cognitive Status: Within Functional Limits for tasks assessed                                         General Comments General comments (skin integrity, edema, etc.): Pt son and granddaughter present during session and assisted with PLOF and home setup information due to pt's decreased hearing, minor drainage around dressing onto pad, Pt on 2L O2 via [place], with SaO2 greater than 91%O2 throughout session however requires vc for pursed lipped breathing.          Assessment/Plan    PT Assessment Patient needs continued PT services  PT Problem List Decreased strength;Decreased range of motion;Decreased activity tolerance;Decreased balance;Decreased mobility;Pain       PT Treatment Interventions DME instruction;Gait training;Functional mobility training;Therapeutic activities;Therapeutic exercise;Balance training;Patient/family education    PT Goals (Current goals can be found in the Care Plan section)  Acute Rehab PT Goals Patient Stated Goal: none stated PT Goal Formulation: With patient/family Time For Goal Achievement: 05/17/18 Potential to Achieve Goals: Fair    Frequency Min 3X/week    AM-PAC PT "6 Clicks" Mobility  Outcome Measure Help needed turning from your back to your side while in a flat bed without using bedrails?: A Lot Help needed moving from lying on your back to sitting on the side of a flat bed without using bedrails?: A Lot Help needed moving to and from a bed to a chair (including a wheelchair)?: Total Help needed standing up from a chair using your arms (e.g., wheelchair or bedside chair)?: Total Help needed to walk in hospital room?: Total Help needed climbing 3-5 steps with a railing? : Total 6 Click Score: 8    End of Session Equipment Utilized During Treatment: Gait belt;Oxygen Activity Tolerance: Patient limited by fatigue;Patient limited by pain Patient left: in bed;with call bell/phone within reach;with bed alarm set;with family/visitor present Nurse Communication: Mobility status PT Visit Diagnosis: Unsteadiness on feet  (R26.81);Other abnormalities of gait and mobility (R26.89);Muscle weakness (generalized) (M62.81);History of falling (Z91.81);Difficulty in walking, not elsewhere classified (R26.2);Pain Pain - Right/Left: Left Pain - part of body: Hip    Time: 1610-9604 PT Time Calculation (min) (ACUTE ONLY): 21 min   Charges:   PT Evaluation $PT Eval Moderate Complexity: 1 Mod          Seraphina Mitchner B. Beverely Risen PT, DPT Acute Rehabilitation Services Pager [phone] Office [phone]   Elon Alas [facility] 05/03/2018, 4:39 PM

## 2018-05-04 DIAGNOSIS — K5900 Constipation, unspecified: Secondary | ICD-10-CM

## 2018-05-04 LAB — CBC
HCT: 30.3 % — ABNORMAL LOW (ref 36.0–46.0)
Hemoglobin: 9.6 g/dL — ABNORMAL LOW (ref 12.0–15.0)
MCH: 30.5 pg (ref 26.0–34.0)
MCHC: 31.7 g/dL (ref 30.0–36.0)
MCV: 96.2 fL (ref 80.0–100.0)
Platelets: 125 10*3/uL — ABNORMAL LOW (ref 150–400)
RBC: 3.15 MIL/uL — AB (ref 3.87–5.11)
RDW: 16.1 % — ABNORMAL HIGH (ref 11.5–15.5)
WBC: 12 10*3/uL — ABNORMAL HIGH (ref 4.0–10.5)
nRBC: 0 % (ref 0.0–0.2)

## 2018-05-04 LAB — TYPE AND SCREEN
ABO/RH(D): A NEG
Antibody Screen: NEGATIVE
UNIT DIVISION: 0
Unit division: 0

## 2018-05-04 LAB — BPAM RBC
BLOOD PRODUCT EXPIRATION DATE: 202001302359
Blood Product Expiration Date: 202001302359
ISSUE DATE / TIME: 202001130800
ISSUE DATE / TIME: 202001131142
Unit Type and Rh: 600
Unit Type and Rh: 600

## 2018-05-04 LAB — BASIC METABOLIC PANEL
Anion gap: 10 (ref 5–15)
BUN: 41 mg/dL — ABNORMAL HIGH (ref 8–23)
CO2: 24 mmol/L (ref 22–32)
Calcium: 8.2 mg/dL — ABNORMAL LOW (ref 8.9–10.3)
Chloride: 104 mmol/L (ref 98–111)
Creatinine, Ser: 1.12 mg/dL — ABNORMAL HIGH (ref 0.44–1.00)
GFR calc Af Amer: 45 mL/min — ABNORMAL LOW (ref >60)
GFR calc non Af Amer: 39 mL/min — ABNORMAL LOW (ref >60)
Glucose, Bld: 135 mg/dL — ABNORMAL HIGH (ref 70–99)
Potassium: 4.3 mmol/L (ref 3.5–5.1)
Sodium: 138 mmol/L (ref 135–145)

## 2018-05-04 NOTE — Progress Notes (Signed)
PROGRESS NOTE    Hannah Arias   IOE:[id number]  DOB: 09-Dec-1910  DOA: 04/30/2018 PCP: Celene Squibb, MD   Brief Narrative:  DEAN WONDER  is a 85 y.o. female who lives alone with medical history significant of chronic diastolic congestive heart failure, GERD presenting to the hospital via EMS after falling at home and experiencing left hip and knee pain. Nursing staff noted dried feces at the bottom of her feet. Found to have a left acute comminuted intertrochanteric fracture, mild AKI, CT showing large volume stool impacted within the rectal vault. She underwent left intertrochanteric nail placement on 1/11.   Subjective: She has no complaints today.  Assessment & Plan:   Principal Problem: Left  Hip fracture ([place]) - per ortho- PT recommending SNF. SW has started process today  Active Problems: Dyspnea and hypoxia on 1/13 - pulse ox in mid 80s on room air- RR in 30s- CXR shows not infiltrates, not wheezing, no h/o COPD, no fever - CTA done stat was unrevealing- checked with RN, after blood transfusion, her respirations improved and her hypoxia has improved- no cough noted     Chronic anemia   Acute blood loss anemia - Hb baseline is about 10-11- Hb has dropped to 6.6 on 1/13- 2 U PRBC transfused - Hb now 9.6  Chronic thrombocytopenia - mild chronic thrombocytopenia (platelets ~ 120 in 2015) -  platelets ~ 100 yesterday- have improved to baseline today    CKD (chronic kidney disease), stage III  - stable    Constipation - impacted stool on CT- on Senna and Miralax daily but no BM as of yet - started Dulcolax daily x 2- had BM yesterday- second dose due today  Chronic diastolic CHF - grade 1 per ECHO in 2015- not on diuretics at home  Right paratracheal mass, atrophic uterus with calcified fibroids - noted incidentally on imaging  Time spent in minutes: 35 DVT prophylaxis: SCDS Code Status: DNR per Grandson who is the POA Family Communication: son and  granson Disposition Plan: SNF Consultants:   ortho Procedures:  1/11- Left trochanteric nail, short 11 mm diameter with 105 mm lag screw and interlock distal screw Affixis Antimicrobials:  Anti-infectives (From admission, onward)   Start     Dose/Rate Route Frequency Ordered Stop   05/01/18 0600  ceFAZolin (ANCEF) IVPB 2g/100 mL premix     2 g 200 mL/hr over 30 Minutes Intravenous On call to O.R. 05/01/18 0039 05/01/18 0922       Objective: Vitals:   05/03/18 1838 05/03/18 2005 05/04/18 0346 05/04/18 1140  BP:  128/64 (!) 156/89 (!) 137/52  Pulse:  81 87 78  Resp:  14 12 18   Temp:  98.4 F (36.9 C) 98.4 F (36.9 C) 98.3 F (36.8 C)  TempSrc:  Oral Oral   SpO2: 96% 93% 92% 90%    Intake/Output Summary (Last 24 hours) at 05/04/2018 1313 Last data filed at 05/04/2018 1250 Gross per 24 hour  Intake 568.75 ml  Output 102 ml  Net 466.75 ml   There were no vitals filed for this visit.  Examination: General exam: Appears comfortable  HEENT: PERRLA, oral mucosa moist, no sclera icterus or thrush- very hard of hearing Respiratory system: Clear to auscultation. Respiratory effort normal. Cardiovascular system: S1 & S2 heard,  2/6 murmur at apex Gastrointestinal system: Abdomen soft, non-tender, nondistended. Normal bowel sound. No organomegaly Central nervous system: Alert and oriented. No focal neurological deficits. Extremities: No cyanosis, clubbing - mild edema  of left leg and left thigh Skin: No rashes or ulcers Psychiatry:  Mood & affect appropriate.     Data Reviewed: I have personally reviewed following labs and imaging studies  CBC: Recent Labs  Lab 05/01/18 0456 05/02/18 0448 05/03/18 0219 05/03/18 1814 05/04/18 0422  WBC 7.0 8.3 10.4 12.8* 12.0*  NEUTROABS  --  5.7 7.7  --   --   HGB 8.9* 7.1* 6.6* 9.6* 9.6*  HCT 27.4* 22.2* 21.2* 30.0* 30.3*  MCV 99.6 101.4* 101.4* 96.2 96.2  PLT 111* 98* 99* 107* 563*   Basic Metabolic Panel: Recent Labs  Lab  04/30/18 2003 04/30/18 2011 05/01/18 0456 05/02/18 0448 05/03/18 0219 05/04/18 0422  NA 140 140 139 140 137 138  K 4.5 4.5 4.2 4.2 4.3 4.3  CL 107 103 104 106 104 104  CO2 27  --  29 27 27 24   GLUCOSE 163* 159* 132* 129* 136* 135*  BUN 22 24* 22 27* 33* 41*  CREATININE 1.20* 1.10* 0.92 1.08* 1.16* 1.12*  CALCIUM 8.8*  --  8.3* 8.1* 8.0* 8.2*   GFR: CrCl cannot be calculated (Unknown ideal weight.). Liver Function Tests: Recent Labs  Lab 04/30/18 2003  AST 17  ALT 10  ALKPHOS 51  BILITOT 0.7  PROT 5.9*  ALBUMIN 3.4*   No results for input(s): LIPASE, AMYLASE in the last 168 hours. No results for input(s): AMMONIA in the last 168 hours. Coagulation Profile: Recent Labs  Lab 04/30/18 2003  INR 1.02   Cardiac Enzymes: Recent Labs  Lab 04/30/18 2338  TROPONINI <0.03   BNP (last 3 results) No results for input(s): PROBNP in the last 8760 hours. HbA1C: No results for input(s): HGBA1C in the last 72 hours. CBG: No results for input(s): GLUCAP in the last 168 hours. Lipid Profile: No results for input(s): CHOL, HDL, LDLCALC, TRIG, CHOLHDL, LDLDIRECT in the last 72 hours. Thyroid Function Tests: No results for input(s): TSH, T4TOTAL, FREET4, T3FREE, THYROIDAB in the last 72 hours. Anemia Panel: No results for input(s): VITAMINB12, FOLATE, FERRITIN, TIBC, IRON, RETICCTPCT in the last 72 hours. Urine analysis:    Component Value Date/Time   COLORURINE AMBER (A) 04/30/2018 2047   APPEARANCEUR HAZY (A) 04/30/2018 2047   LABSPEC 1.026 04/30/2018 2047   PHURINE 5.0 04/30/2018 2047   GLUCOSEU NEGATIVE 04/30/2018 2047   HGBUR NEGATIVE 04/30/2018 2047   BILIRUBINUR NEGATIVE 04/30/2018 2047   KETONESUR 5 (A) 04/30/2018 2047   PROTEINUR 100 (A) 04/30/2018 2047   UROBILINOGEN 0.2 05/11/2013 1548   NITRITE NEGATIVE 04/30/2018 2047   LEUKOCYTESUR SMALL (A) 04/30/2018 2047   Sepsis Labs: @LABRCNTIP (procalcitonin:4,lacticidven:4) ) Recent Results (from the past 240  hour(s))  Surgical PCR screen     Status: None   Collection Time: 05/01/18  3:27 AM  Result Value Ref Range Status   MRSA, PCR NEGATIVE NEGATIVE Final   Staphylococcus aureus NEGATIVE NEGATIVE Final    Comment: (NOTE) The Xpert SA Assay (FDA approved for NASAL specimens in patients 109 years of age and older), is one component of a comprehensive surveillance program. It is not intended to diagnose infection nor to guide or monitor treatment. Performed at [facility], [place] [address], [place],  [address]          Radiology Studies: Ct Chest W Contrast  Result Date: 05/03/2018 CLINICAL DATA:  Shortness of breath. EXAM: CT CHEST WITH CONTRAST TECHNIQUE: Multidetector CT imaging of the chest was performed during intravenous contrast administration. CONTRAST:  157mL OMNIPAQUE IOHEXOL  300 MG/ML  SOLN COMPARISON:  Radiograph of same day. FINDINGS: Cardiovascular: Atherosclerosis of thoracic aorta is noted without aneurysm or dissection. Normal cardiac size. No pericardial effusion. Mediastinum/Nodes: 2.2 cm right thyroid nodule is noted. No adenopathy is noted. Esophagus is unremarkable. Lungs/Pleura: No pneumothorax is noted. Mild right posterior basilar subsegmental atelectasis is noted. No significant pleural effusion is noted. Upper Abdomen: No acute abnormality. Musculoskeletal: No chest wall abnormality. No acute or significant osseous findings. IMPRESSION: 2.2 cm right thyroid nodule is noted. Thyroid ultrasound is recommended for further evaluation. Minimal right posterior basilar subsegmental atelectasis. Aortic Atherosclerosis (ICD10-I70.0). Electronically Signed   By: Marijo Conception, M.D.   On: 05/03/2018 15:24   Dg Chest Port 1 View  Result Date: 05/03/2018 CLINICAL DATA:  85 year old female with hypoxia. Subsequent encounter. EXAM: PORTABLE CHEST 1 VIEW COMPARISON:  04/30/2008 and 04/22/2017 chest x-ray. FINDINGS: Cardiomegaly. Calcified tortuous aorta. Central  pulmonary vascular prominence without pulmonary edema. Right paratracheal fullness unchanged. No segmental consolidation or pneumothorax. Bilateral shoulder joint degenerative changes. IMPRESSION: 1. No segmental consolidation or pulmonary edema. 2.  Aortic Atherosclerosis (ICD10-I70.0). Calcified tortuous aorta. 3. Prominence right paratracheal region unchanged and may be secondary to vasculature or substernal extension of enlarged thyroid gland. 4. Cardiomegaly. Electronically Signed   By: Genia Del M.D.   On: 05/03/2018 09:38      Scheduled Meds: . mupirocin ointment  1 application Nasal BID  . pantoprazole  40 mg Oral Daily  . polyethylene glycol  17 g Oral Daily  . povidone-iodine  2 application Topical Once  . senna-docusate  1 tablet Oral BID   Continuous Infusions:    LOS: 4 days      Debbe Odea, MD [facility] Pager: [email or web address] Password [email or web address] 05/04/2018, 1:13 PM

## 2018-05-04 NOTE — Progress Notes (Addendum)
Occupational Therapy Evaluation Patient Details Name: Hannah Arias MRN: [id number] DOB: [birth year] Today's Date: 05/04/2018    History of Present Illness 85 year old female has been healthy and is in independent living with good mental acuity slipped fell suffering a left closed intertrochanteric hip fracture. s/p IM nail 05/02/18. L WBAT   Clinical Impression   PTA. Pt PLOF independent as stated from great grand daughter. She lives with family members who are available to assist 24/7. Pt utilized AE devices for ambulation and self care tasks (reacher and sock aide). Pt currently is limited LB and UB ADLs due to increase pain, range of motion and strength. Pt required Max A x2 for bed mobility to sit at EOB, ability to sit upright with BUE for support. Max x2 stand pivot transfer to chair due to inability to maintain weight or balance with use of RW. D/C recommendation SNF placement to continue rehab for transition to home setting.     Follow Up Recommendations  SNF;Supervision/Assistance - 24 hour    Equipment Recommendations  None recommended by OT    Recommendations for Other Services       Precautions / Restrictions Precautions Precautions: Fall Precaution Comments: fx result of fall Restrictions Weight Bearing Restrictions: Yes LLE Weight Bearing: Non weight bearing      Mobility Bed Mobility Overal bed mobility: Needs Assistance Bed Mobility: Supine to Sit;Rolling Rolling: Mod assist   Supine to sit: +2 for physical assistance;Mod assist     General bed mobility comments: modA for rolling R and L for pericare prior to mobility, x2 for bringing LE of bed, trunk to upright and pad scoot of hips to EoB  Transfers Overall transfer level: Needs assistance Equipment used: Rolling walker (2 wheeled) Transfers: Sit to/from Stand Sit to Stand: Total assist;+2 physical assistance Stand pivot transfers: Max assist;+2 physical assistance       General transfer comment:  pt attempted to come to standing however unable to achieve upright due to decreased posterio pelvic tilt pt with 3/4 DoE after attempt and returned to bed.    Balance Overall balance assessment: Needs assistance Sitting-balance support: Bilateral upper extremity supported;Feet supported Sitting balance-Leahy Scale: Poor Sitting balance - Comments: requires support to maintain balance   Standing balance support: Bilateral upper extremity supported Standing balance-Leahy Scale: Zero                             ADL either performed or assessed with clinical judgement   ADL Overall ADL's : Needs assistance/impaired     Grooming: Maximal assistance   Upper Body Bathing: Minimal assistance Upper Body Bathing Details (indicate cue type and reason): Pt requires cueing for arm and hand placement to orientation clothing item.  Lower Body Bathing: Total assistance   Upper Body Dressing : Minimal assistance   Lower Body Dressing: Total assistance Lower Body Dressing Details (indicate cue type and reason): Pt requires assistance with LB dressing due to inability bend knees or to reach.  Toilet Transfer: Maximal assistance;+2 for physical assistance;+2 for safety/equipment;Squat-pivot;Cueing for safety;Cueing for sequencing Toilet Transfer Details (indicate cue type and reason): Toilet transfer simulated from bed to recliner. Pt required hand over hand assistance for hand placement to desired seated surface.  Toileting- Clothing Manipulation and Hygiene: Total assistance Toileting - Clothing Manipulation Details (indicate cue type and reason): Pt required total assist with hygiene in bed to lack of arm and core strength to roll for side lying.  Functional mobility during ADLs: Maximal assistance;+2 for physical assistance;+2 for safety/equipment;Cueing for safety;Cueing for sequencing General ADL Comments: Pt demonstrates decreased standing tolerance to engage in sink level ADLs.  Good static sitting balance at EOB with the use of BUE for support.      Vision         Perception     Praxis      Pertinent Vitals/Pain Pain Assessment: Faces Faces Pain Scale: Hurts even more Pain Location: L hip with movement, and stomach after moving Pain Descriptors / Indicators: Grimacing;Guarding;Operative site guarding Pain Intervention(s): Limited activity within patient's tolerance;Monitored during session;Premedicated before session;Repositioned(RN notified of pain )     Hand Dominance     Extremity/Trunk Assessment Upper Extremity Assessment Upper Extremity Assessment: Generalized weakness   Lower Extremity Assessment Lower Extremity Assessment: Defer to PT evaluation RLE Deficits / Details: strength grossly assessed in mobility at 3/5 LLE Deficits / Details: decreased AROM secondary to pain,  LLE: Unable to fully assess due to pain   Cervical / Trunk Assessment Cervical / Trunk Assessment: Kyphotic   Communication Communication Communication: HOH   Cognition Arousal/Alertness: Lethargic Behavior During Therapy: Flat affect;Anxious Overall Cognitive Status: Within Functional Limits for tasks assessed                                 General Comments: Pt requires constant cuing for keeping eyes open to look at bed rail or therapist hand for assist   General Comments  Pt great granddaughter present to provide PLOF due to pt HOH.    Exercises     Shoulder Instructions      Home Living Family/patient expects to be discharged to:: Private residence Living Arrangements: Alone Available Help at Discharge: Family;Available PRN/intermittently Type of Home: House Home Access: Stairs to enter;Ramped entrance Entrance Stairs-Number of Steps: 1(1 step to ramp)   Home Layout: One level     Bathroom Shower/Tub: Teacher, early years/pre: Standard Bathroom Accessibility: Yes   Home Equipment: Environmental consultant - 2 wheels;Tub bench           Prior Functioning/Environment Level of Independence: Needs assistance  Gait / Transfers Assistance Needed: uses RW for ambulation  ADL's / Homemaking Assistance Needed: daughter in-law assists with bathing and family brings in hot meals   Comments: As reported from family, pt was independent but used a RW walker for ambulation in home setting. Pt states using AE for LB dressing.          OT Problem List: Decreased strength;Decreased activity tolerance;Pain;Impaired balance (sitting and/or standing)      OT Treatment/Interventions: Self-care/ADL training;DME and/or AE instruction;Patient/family education    OT Goals(Current goals can be found in the care plan section) Acute Rehab OT Goals Patient Stated Goal: none stated Time For Goal Achievement: 05/18/18 Potential to Achieve Goals: Fair  OT Frequency: Min 2X/week   Barriers to D/C:            Co-evaluation              AM-PAC OT "6 Clicks" Daily Activity     Outcome Measure Help from another person eating meals?: A Little Help from another person taking care of personal grooming?: A Lot Help from another person toileting, which includes using toliet, bedpan, or urinal?: Total Help from another person bathing (including washing, rinsing, drying)?: Total Help from another person to put on and taking off regular upper body clothing?: A  Little Help from another person to put on and taking off regular lower body clothing?: A Lot 6 Click Score: 12   End of Session Equipment Utilized During Treatment: Gait belt;Rolling walker;Oxygen Nurse Communication: Mobility status;Need for lift equipment;Precautions  Activity Tolerance: Patient limited by lethargy;Patient limited by fatigue Patient left: in chair;with call bell/phone within reach;with family/visitor present  OT Visit Diagnosis: Unsteadiness on feet (R26.81);Muscle weakness (generalized) (M62.81)                Time: 2924-4628 OT Time Calculation (min): 39  min Charges:  OT General Charges $OT Visit: 1 Visit OT Evaluation $OT Eval Moderate Complexity: 1 Mod OT Treatments $Self Care/Home Management : 8-22 mins  Minus Breeding, MSOT, OTR/L  Supplemental Rehabilitation Services  [phone]  Hannah Arias 05/04/2018, 4:12 PM

## 2018-05-04 NOTE — Plan of Care (Signed)
  Problem: Pain Management: Goal: Pain level will decrease Outcome: Progressing   Problem: Pain Management: Goal: Pain level will decrease Outcome: Progressing

## 2018-05-04 NOTE — Progress Notes (Signed)
Physical Therapy Treatment Patient Details Name: Hannah Arias MRN: [id number] DOB: 16-Jul-1910 Today's Date: 05/04/2018    History of Present Illness 85 year old female has been healthy and is in independent living with good mental acuity slipped fell suffering a left closed intertrochanteric hip fracture. s/p IM nail 05/02/18. L WBAT    PT Comments    Pt sleeping on entry, able to rouse and participate with therapy today. Pt limited in safe mobility by L LE pain and fear of falling. Pt was incontinent of stool on entry and requires modA for rolling R and L for pericare. Pt requires maxAx2 for coming to EoB and totalA for 2 person stand pivot transfer to recliner. D/c recommendations remain appropriate at this time. PT will continue to follow acutely.      Follow Up Recommendations  SNF     Equipment Recommendations  Other (comment)(TBD at next venue)    Recommendations for Other Services OT consult     Precautions / Restrictions Precautions Precautions: Fall Precaution Comments: fx result of fall Restrictions Weight Bearing Restrictions: Yes LLE Weight Bearing: Non weight bearing    Mobility  Bed Mobility Overal bed mobility: Needs Assistance Bed Mobility: Supine to Sit;Rolling Rolling: Mod assist   Supine to sit: +2 for physical assistance;Mod assist     General bed mobility comments: modA for rolling R and L for pericare prior to mobility, x2 for bringing LE of bed, trunk to upright and pad scoot of hips to EoB  Transfers Overall transfer level: Needs assistance Equipment used: Rolling walker (2 wheeled) Transfers: Sit to/from Stand Sit to Stand: Total assist;+2 physical assistance Stand pivot transfers: Max assist;+2 physical assistance       General transfer comment: pt attempted to come to standing however unable to achieve upright due to decreased posterio pelvic tilt pt with 3/4 DoE after attempt and returned to bed.  Ambulation/Gait              General Gait Details: unable       Balance Overall balance assessment: Needs assistance Sitting-balance support: Bilateral upper extremity supported;Feet supported Sitting balance-Leahy Scale: Poor Sitting balance - Comments: requires support to maintain balance   Standing balance support: Bilateral upper extremity supported Standing balance-Leahy Scale: Zero                              Cognition Arousal/Alertness: Lethargic Behavior During Therapy: Flat affect;Anxious Overall Cognitive Status: Within Functional Limits for tasks assessed                                 General Comments: Pt requires constant cuing for keeping eyes open to look at bed rail or therapist hand for assist         General Comments General comments (skin integrity, edema, etc.): Pt great granddaughter present during session. Pt on RA during session, dropped to 86% SaO2 with movement but rebounded to 91%O2 with sitting       Pertinent Vitals/Pain Pain Assessment: Faces Faces Pain Scale: Hurts even more Pain Location: L hip with movement, and stomach after moving Pain Descriptors / Indicators: Grimacing;Guarding;Operative site guarding Pain Intervention(s): Limited activity within patient's tolerance;Monitored during session;Premedicated before session;Repositioned(RN notified of pain )    Home Living Family/patient expects to be discharged to:: Private residence Living Arrangements: Alone Available Help at Discharge: Family;Available PRN/intermittently Type of Home: House Home Access:  Stairs to enter;Ramped entrance   Home Layout: One level Home Equipment: Walker - 2 wheels;Tub bench      Prior Function Level of Independence: Needs assistance  Gait / Transfers Assistance Needed: uses RW for ambulation  ADL's / Homemaking Assistance Needed: daughter in-law assists with bathing and family brings in hot meals Comments: As reported from family, pt was independent but  used a RW walker for ambulation in home setting. Pt states using AE for LB dressing.     PT Goals (current goals can now be found in the care plan section) Acute Rehab PT Goals Patient Stated Goal: none stated PT Goal Formulation: With patient/family Time For Goal Achievement: 05/17/18 Potential to Achieve Goals: Fair Progress towards PT goals: Progressing toward goals    Frequency    Min 3X/week      PT Plan Current plan remains appropriate       AM-PAC PT "6 Clicks" Mobility   Outcome Measure  Help needed turning from your back to your side while in a flat bed without using bedrails?: A Lot Help needed moving from lying on your back to sitting on the side of a flat bed without using bedrails?: A Lot Help needed moving to and from a bed to a chair (including a wheelchair)?: Total Help needed standing up from a chair using your arms (e.g., wheelchair or bedside chair)?: Total Help needed to walk in hospital room?: Total Help needed climbing 3-5 steps with a railing? : Total 6 Click Score: 8    End of Session Equipment Utilized During Treatment: Gait belt Activity Tolerance: Patient limited by fatigue;Patient limited by pain Patient left: in bed;with call bell/phone within reach;with bed alarm set;with family/visitor present Nurse Communication: Mobility status PT Visit Diagnosis: Unsteadiness on feet (R26.81);Other abnormalities of gait and mobility (R26.89);Muscle weakness (generalized) (M62.81);History of falling (Z91.81);Difficulty in walking, not elsewhere classified (R26.2);Pain Pain - Right/Left: Left Pain - part of body: Hip     Time: 1610-9604 PT Time Calculation (min) (ACUTE ONLY): 31 min  Charges:  $Therapeutic Activity: 8-22 mins                     Rafael Quesada B. Beverely Risen PT, DPT Acute Rehabilitation Services Pager [phone] Office [phone]    Hannah Arias 05/04/2018, 4:39 PM

## 2018-05-04 NOTE — Clinical Social Work Note (Signed)
Clinical Social Work Assessment  Patient Details  Name: Hannah Arias MRN: [id number] Date of Birth: [birth year]  Date of referral:  05/04/18               Reason for consult:  Discharge Planning                Permission sought to share information with:  Case Manager, Facility Sport and exercise psychologist, Family Supports Permission granted to share information::  Yes, Verbal Permission Granted  Name::     E Carneiro  Agency::  SNFs  Relationship::  son  Contact Information:  [phone]  Housing/Transportation Living arrangements for the past 2 months:  [place] of Information:  Adult Children Patient Interpreter Needed:  None Criminal Activity/Legal Involvement Pertinent to Current Situation/Hospitalization:  No - Comment as needed Significant Relationships:  Adult Children Lives with:  Self Do you feel safe going back to the place where you live?  No Need for family participation in patient care:  Yes (Comment)  Care giving concerns:  CSW received referral for possible SNF placement at time of discharge. Spoke with patient regarding possibility of SNF placement . Patient struggled to understand CSW and CSW was unable to communicated with patient due to her speech deficits. CSW contacted son who reports he  is currently unable to care for her at their home given patient's current needs and fall risk.  Patient's son expressed understanding of PT recommendation and are agreeable to SNF placement at time of discharge. CSW to continue to follow and assist with discharge planning needs.     Social Worker assessment / plan:  Spoke with patient's son   concerning possibility of rehab at [facility] before returning home. He reports preference is for [facility].   Employment status:  Retired Nurse, adult PT Recommendations:  [place] / Referral to community resources:  [place]  Patient/Family's  Response to care:  Patient's son  recognize need for rehab before returning home and are agreeable to a SNF. They report preference for [facility]      . CSW explained insurance authorization process. Patient's family reported that they want patient to get stronger to be able to come back home.    Patient/Family's Understanding of and Emotional Response to Diagnosis, Current Treatment, and Prognosis:  Patient/family is realistic regarding therapy needs and expressed being hopeful for SNF placement. Patient expressed understanding of CSW role and discharge process as well as medical condition. No questions/concerns about plan or treatment.    Emotional Assessment Appearance:  Appears stated age Attitude/Demeanor/Rapport:  Unable to Assess Affect (typically observed):  Unable to Assess Orientation:  Oriented to Self, Oriented to Place, Oriented to  Time, Oriented to Situation Alcohol / Substance use:  Not Applicable Psych involvement (Current and /or in the community):  No (Comment)  Discharge Needs  Concerns to be addressed:  Discharge Planning Concerns Readmission within the last 30 days:  No Current discharge risk:  Dependent with Mobility Barriers to Discharge:  Continued Medical Work up   [employer], [place] 05/04/2018, 11:29 AM

## 2018-05-04 NOTE — Progress Notes (Addendum)
   Subjective: 3 Days Post-Op Procedure(s) (LRB): LEFT BIOMET AFFIXUS (IM) SHORT NAIL, INTERTROCHANTRIC FRACTURE (Left) Patient reports pain as mild and moderate.    Objective: Vital signs in last 24 hours: Temp:  [98.3 F (36.8 C)-99 F (37.2 C)] 98.4 F (36.9 C) (01/14 0346) Pulse Rate:  [80-87] 87 (01/14 0346) Resp:  [12-26] 12 (01/14 0346) BP: (116-156)/(50-89) 156/89 (01/14 0346) SpO2:  [87 %-96 %] 92 % (01/14 0346)  Intake/Output from previous day: 01/13 0701 - 01/14 0700 In: 1232.1 [Blood:1232.1] Out: 1 [Stool:1] Intake/Output this shift: No intake/output data recorded.  Recent Labs    05/02/18 0448 05/03/18 0219 05/03/18 1814 05/04/18 0422  HGB 7.1* 6.6* 9.6* 9.6*   Recent Labs    05/03/18 1814 05/04/18 0422  WBC 12.8* 12.0*  RBC 3.12* 3.15*  HCT 30.0* 30.3*  PLT 107* 125*   Recent Labs    05/03/18 0219 05/04/18 0422  NA 137 138  K 4.3 4.3  CL 104 104  CO2 27 24  BUN 33* 41*  CREATININE 1.16* 1.12*  GLUCOSE 136* 135*  CALCIUM 8.0* 8.2*   No results for input(s): LABPT, INR in the last 72 hours.  dressing saturated serosanguinous Ct Chest W Contrast  Result Date: 05/03/2018 CLINICAL DATA:  Shortness of breath. EXAM: CT CHEST WITH CONTRAST TECHNIQUE: Multidetector CT imaging of the chest was performed during intravenous contrast administration. CONTRAST:  144mL OMNIPAQUE IOHEXOL 300 MG/ML  SOLN COMPARISON:  Radiograph of same day. FINDINGS: Cardiovascular: Atherosclerosis of thoracic aorta is noted without aneurysm or dissection. Normal cardiac size. No pericardial effusion. Mediastinum/Nodes: 2.2 cm right thyroid nodule is noted. No adenopathy is noted. Esophagus is unremarkable. Lungs/Pleura: No pneumothorax is noted. Mild right posterior basilar subsegmental atelectasis is noted. No significant pleural effusion is noted. Upper Abdomen: No acute abnormality. Musculoskeletal: No chest wall abnormality. No acute or significant osseous findings.  IMPRESSION: 2.2 cm right thyroid nodule is noted. Thyroid ultrasound is recommended for further evaluation. Minimal right posterior basilar subsegmental atelectasis. Aortic Atherosclerosis (ICD10-I70.0). Electronically Signed   By: Marijo Conception, M.D.   On: 05/03/2018 15:24   Dg Chest Port 1 View  Result Date: 05/03/2018 CLINICAL DATA:  85 year old female with hypoxia. Subsequent encounter. EXAM: PORTABLE CHEST 1 VIEW COMPARISON:  04/30/2008 and 04/22/2017 chest x-ray. FINDINGS: Cardiomegaly. Calcified tortuous aorta. Central pulmonary vascular prominence without pulmonary edema. Right paratracheal fullness unchanged. No segmental consolidation or pneumothorax. Bilateral shoulder joint degenerative changes. IMPRESSION: 1. No segmental consolidation or pulmonary edema. 2.  Aortic Atherosclerosis (ICD10-I70.0). Calcified tortuous aorta. 3. Prominence right paratracheal region unchanged and may be secondary to vasculature or substernal extension of enlarged thyroid gland. 4. Cardiomegaly. Electronically Signed   By: Genia Del M.D.   On: 05/03/2018 09:38    Assessment/Plan: 3 Days Post-Op Procedure(s) (LRB): LEFT BIOMET AFFIXUS (IM) SHORT NAIL, INTERTROCHANTRIC FRACTURE (Left) Up with therapy, SNF. She is just taking tylenol for pain. No narcotics needed for discharge.  Since allergic to aspirin we can use TEDS  Post discharge.   [name] 05/04/2018, 8:01 AM

## 2018-05-04 NOTE — Progress Notes (Signed)
[place] is starting insurance auth today, CSW confirmed with son that this was preference.   CSW will continue to follow up on [place] with [facility].   [place], [place]

## 2018-05-04 NOTE — Progress Notes (Signed)
Patient has been c/o abdominal discomfort that started earlier today.  Placed a warm compression on her lower abdomen.  She voided 100 mL.  Adjusted Purewick and gave Tylenol.  Did a bladder scan and it  showed 70 mL of urine.  Patient's urine was amber in color.  Family concerned.  Monitoring out put and will contact MD if condition does not improve.  Patient is currently resting.

## 2018-05-05 DIAGNOSIS — D649 Anemia, unspecified: Secondary | ICD-10-CM

## 2018-05-05 DIAGNOSIS — S72001A Fracture of unspecified part of neck of right femur, initial encounter for closed fracture: Secondary | ICD-10-CM

## 2018-05-05 DIAGNOSIS — D62 Acute posthemorrhagic anemia: Secondary | ICD-10-CM

## 2018-05-05 DIAGNOSIS — N179 Acute kidney failure, unspecified: Secondary | ICD-10-CM

## 2018-05-05 LAB — URINALYSIS, ROUTINE W REFLEX MICROSCOPIC
Bilirubin Urine: NEGATIVE
GLUCOSE, UA: NEGATIVE mg/dL
Ketones, ur: NEGATIVE mg/dL
Nitrite: NEGATIVE
Protein, ur: 30 mg/dL — AB
SPECIFIC GRAVITY, URINE: 1.028 (ref 1.005–1.030)
WBC, UA: >50 WBC/hpf — ABNORMAL HIGH (ref 0–5)
pH: 5 (ref 5.0–8.0)

## 2018-05-05 MED ORDER — SODIUM CHLORIDE 0.9 % IV SOLN
INTRAVENOUS | Status: DC
  Administered 2018-05-05 – 2018-05-06 (×2): via INTRAVENOUS

## 2018-05-05 MED ORDER — SODIUM CHLORIDE 0.9 % IV SOLN
1.0000 g | INTRAVENOUS | Status: DC
  Administered 2018-05-05 – 2018-05-06 (×2): 1 g via INTRAVENOUS
  Filled 2018-05-05 (×3): qty 10

## 2018-05-05 MED ORDER — HYDROCODONE-ACETAMINOPHEN 5-325 MG PO TABS
1.0000 | ORAL_TABLET | Freq: Once | ORAL | Status: AC
  Administered 2018-05-05: 1 via ORAL
  Filled 2018-05-05: qty 1

## 2018-05-05 MED ORDER — CYCLOBENZAPRINE HCL 10 MG PO TABS
5.0000 mg | ORAL_TABLET | Freq: Once | ORAL | Status: AC
  Administered 2018-05-05: 5 mg via ORAL
  Filled 2018-05-05: qty 1

## 2018-05-05 NOTE — Progress Notes (Addendum)
[facility]                                                                              Patient Demographics  Hannah Arias, is a 85 y.o. female, DOB - August 17, 1910, [id number]  Admit date - 04/30/2018   Admitting Physician John Giovanni, MD  Outpatient Primary MD for the patient is Benita Stabile, MD  Outpatient specialists:   LOS - 5  days   Medical records reviewed and are as summarized below:    Chief Complaint  Patient presents with  . Fall       Brief summary   Hannah Arias is a 85 y.o.femalewho lives alone with medical history significant ofchronic diastolic congestive heart failure, GERD presenting to the hospital via EMS after falling at home and experiencing left hip and knee pain. Nursing staff noted dried feces at the bottom of her feet. Found to have a left acute comminuted intertrochanteric fracture, mild AKI, CT showing large volume stool impacted within the rectal vault. She underwent left intertrochanteric nail placement on 1/11.   Assessment & Plan    Principal Problem: Left hip fracture (HCC) -After mechanical fall, status post left anterior trochanteric nail placement on 1/11 -Pain controlled with Tylenol, PT recommending skilled nursing facility  Active Problems:   AKI (acute kidney injury) (HCC) on CKD stage III -Creatinine slowly trending up, urine dark, patient's granddaughter at the bedside concerned about UTI or dehydration - will check UA rule out UTI -Placed on gentle hydration for 1L, then KVO   UTI:  UA + UTI, will place on IV rocephin, follow cultures   Dyspnea and hypoxia on 1/13 -Chest x-ray showed no infiltrates, CT angiogram showed no PE, no pleural effusion, mild right posterior basilar subsegmental atelectasis, no pneumothorax -O2 sats 94% on 2 L, wean as tolerated -Avoid narcotics or sedatives  Acute blood loss anemia on chronic anemia -Baseline hemoglobin 10-11, dropped to 6.6 on 1/13, status post  2 units packed RBCs -Recheck CBC in a.m.  Chronic thrombocytopenia -Platelets stable, improving   Constipation Patient started on Colace, MiraLAX daily  Right paratracheal mass, atrophic uterus with calcified fibroid -Noted incidentally on the imaging  Chronic diastolic CHF Grade 1 per echo in 2015, not on diuretics at home  Code Status: DNR DVT Prophylaxis:   SCD's Family Communication: Discussed in detail with the patient, all imaging results, lab results explained to the patient, 2 sons, granddaughter and daughter-in-law at the bedside   Disposition Plan: Plan for possible DC to skilled nursing facility in a.m., follow labs, UA to rule out UTI, gentle hydration today  Time Spent in minutes 35 minutes  Procedures:  /11- Left trochanteric nail, short 11 mm diameter with 105 mm lag screw and interlock distal screw  Consultants:   Orthopedics  Antimicrobials:   Anti-infectives (From admission, onward)   Start     Dose/Rate Route Frequency Ordered Stop   05/01/18 0600  ceFAZolin (ANCEF) IVPB 2g/100 mL premix     2 g 200 mL/hr over 30 Minutes Intravenous On call to O.R. 05/01/18 3295 05/01/18 1884  Medications  Scheduled Meds: . mupirocin ointment  1 application Nasal BID  . pantoprazole  40 mg Oral Daily  . polyethylene glycol  17 g Oral Daily  . povidone-iodine  2 application Topical Once  . senna-docusate  1 tablet Oral BID   Continuous Infusions: . sodium chloride 75 mL/hr at 05/05/18 1154   PRN Meds:.acetaminophen, albuterol      Subjective:   Hannah Arias was seen and examined today.  Breathing through her mouth, per granddaughter at the bedside, this is normal for the patient.  Appears to be somewhat uncomfortable.  Family concerned about dark urine.  States appetite has started improving today.  Pain controlled.    Objective:   Vitals:   05/04/18 1140 05/04/18 2117 05/05/18 0401 05/05/18 1219  BP: (!) 137/52 124/65 (!) 144/65 (!)  129/57  Pulse: 78 73 70 79  Resp: 18 16 18 20   Temp: 98.3 F (36.8 C) 97.6 F (36.4 C) 98.2 F (36.8 C) 98 F (36.7 C)  TempSrc:  Oral Oral Oral  SpO2: 90% 96% 91% 94%    Intake/Output Summary (Last 24 hours) at 05/05/2018 1358 Last data filed at 05/05/2018 0500 Gross per 24 hour  Intake -  Output 300 ml  Net -300 ml     Wt Readings from Last 3 Encounters:  03/31/18 72.1 kg  04/22/17 68 kg  06/21/15 72.6 kg     Exam  General: Alert and oriented x 3, NAD  Eyes:  HEENT:    Cardiovascular: S1 S2 auscultated, Regular rate and rhythm.  Respiratory: Decreased breath sound at the bases  Gastrointestinal: Soft, nontender, nondistended, + bowel sounds  Ext: no pedal edema bilaterally  Neuro: No new deficits  Musculoskeletal: No digital cyanosis, clubbing  Skin: Left thigh staples intact, dried blood  Psych: Normal affect and demeanor, alert and oriented x3    Data Reviewed:  I have personally reviewed following labs and imaging studies  Micro Results Recent Results (from the past 240 hour(s))  Surgical PCR screen     Status: None   Collection Time: 05/01/18  3:27 AM  Result Value Ref Range Status   MRSA, PCR NEGATIVE NEGATIVE Final   Staphylococcus aureus NEGATIVE NEGATIVE Final    Comment: (NOTE) The Xpert SA Assay (FDA approved for NASAL specimens in patients 51 years of age and older), is one component of a comprehensive surveillance program. It is not intended to diagnose infection nor to guide or monitor treatment. Performed at [facility], [address], [place], [place] [address]     Radiology Reports Dg Tibia/fibula Left  Result Date: 04/30/2018 CLINICAL DATA:  Fall EXAM: LEFT TIBIA AND FIBULA - 2 VIEW COMPARISON:  None. FINDINGS: Joint space calcifications at the knee. No acute displaced fracture or malalignment. Diffuse soft tissue swelling. Vascular calcifications. IMPRESSION: No acute osseous abnormality.  Chondrocalcinosis at the  knee Electronically Signed   By: Jasmine Pang M.D.   On: 04/30/2018 20:50   Dg Ankle Complete Left  Result Date: 04/30/2018 CLINICAL DATA:  Fall with left-sided leg pain EXAM: LEFT ANKLE COMPLETE - 3+ VIEW COMPARISON:  None. FINDINGS: Diffuse soft tissue edema. Bones appear osteopenic. No fracture or malalignment. Moderate plantar calcaneal spur. IMPRESSION: No acute osseous abnormality. Electronically Signed   By: Jasmine Pang M.D.   On: 04/30/2018 20:51   Ct Head Wo Contrast  Result Date: 04/30/2018 CLINICAL DATA:  Slipped and fell EXAM: CT HEAD WITHOUT CONTRAST CT CERVICAL SPINE WITHOUT CONTRAST TECHNIQUE: Multidetector CT  imaging of the head and cervical spine was performed following the standard protocol without intravenous contrast. Multiplanar CT image reconstructions of the cervical spine were also generated. COMPARISON:  None. FINDINGS: CT HEAD FINDINGS Brain: Diffuse parenchymal atrophy. Patchy areas of hypoattenuation in deep and periventricular white matter bilaterally. Negative for acute intracranial hemorrhage, mass lesion, acute infarction, midline shift, or mass-effect. Acute infarct may be inapparent on noncontrast CT. Ventricles and sulci symmetric. Bilateral basal ganglia mineralization. Vascular: Atherosclerotic and physiologic intracranial calcifications. Skull: Normal. Negative for fracture or focal lesion. Sinuses/Orbits: Mucoperiosteal thickening in the left maxillary sinus. Missing dentition. Other: Left frontal scalp soft tissue swelling. CT CERVICAL SPINE FINDINGS Alignment: Mild cervical dextroscoliosis. No significant spondylolisthesis, with some degradation of reconstructions due to motion. Skull base and vertebrae: Negative for fracture or focal bone lesion. Soft tissues and spinal canal: No visible canal hematoma. No prevertebral fluid or gas. Disc levels: Asymmetric facet DJD C2-T1, left worse than right. Narrowing of interspaces C4-C7 with small endplate spurs. Upper  chest: Negative. Other: Bilateral calcified carotid bifurcation plaque. IMPRESSION: 1. Negative for bleed or other acute intracranial process. 2. Atrophy and nonspecific white matter changes. 3. Negative for cervical fracture or other acute bone abnormality. 4. Multilevel cervical degenerative changes as above. Electronically Signed   By: Corlis Leak M.D.   On: 04/30/2018 21:29   Ct Chest W Contrast  Result Date: 05/03/2018 CLINICAL DATA:  Shortness of breath. EXAM: CT CHEST WITH CONTRAST TECHNIQUE: Multidetector CT imaging of the chest was performed during intravenous contrast administration. CONTRAST:  OMNIPAQUE IOHEXOL 300 MG/ML  SOLN COMPARISON:  Radiograph of same day. FINDINGS: Cardiovascular: Atherosclerosis of thoracic aorta is noted without aneurysm or dissection. Normal cardiac size. No pericardial effusion. Mediastinum/Nodes: 2.2 cm right thyroid nodule is noted. No adenopathy is noted. Esophagus is unremarkable. Lungs/Pleura: No pneumothorax is noted. Mild right posterior basilar subsegmental atelectasis is noted. No significant pleural effusion is noted. Upper Abdomen: No acute abnormality. Musculoskeletal: No chest wall abnormality. No acute or significant osseous findings. IMPRESSION: 2.2 cm right thyroid nodule is noted. Thyroid ultrasound is recommended for further evaluation. Minimal right posterior basilar subsegmental atelectasis. Aortic Atherosclerosis (ICD10-I70.0). Electronically Signed   By: Lupita Raider, M.D.   On: 05/03/2018 15:24   Ct Cervical Spine Wo Contrast  Result Date: 04/30/2018 CLINICAL DATA:  Slipped and fell EXAM: CT HEAD WITHOUT CONTRAST CT CERVICAL SPINE WITHOUT CONTRAST TECHNIQUE: Multidetector CT imaging of the head and cervical spine was performed following the standard protocol without intravenous contrast. Multiplanar CT image reconstructions of the cervical spine were also generated. COMPARISON:  None. FINDINGS: CT HEAD FINDINGS Brain: Diffuse parenchymal  atrophy. Patchy areas of hypoattenuation in deep and periventricular white matter bilaterally. Negative for acute intracranial hemorrhage, mass lesion, acute infarction, midline shift, or mass-effect. Acute infarct may be inapparent on noncontrast CT. Ventricles and sulci symmetric. Bilateral basal ganglia mineralization. Vascular: Atherosclerotic and physiologic intracranial calcifications. Skull: Normal. Negative for fracture or focal lesion. Sinuses/Orbits: Mucoperiosteal thickening in the left maxillary sinus. Missing dentition. Other: Left frontal scalp soft tissue swelling. CT CERVICAL SPINE FINDINGS Alignment: Mild cervical dextroscoliosis. No significant spondylolisthesis, with some degradation of reconstructions due to motion. Skull base and vertebrae: Negative for fracture or focal bone lesion. Soft tissues and spinal canal: No visible canal hematoma. No prevertebral fluid or gas. Disc levels: Asymmetric facet DJD C2-T1, left worse than right. Narrowing of interspaces C4-C7 with small endplate spurs. Upper chest: Negative. Other: Bilateral calcified carotid bifurcation plaque. IMPRESSION: 1. Negative for  bleed or other acute intracranial process. 2. Atrophy and nonspecific white matter changes. 3. Negative for cervical fracture or other acute bone abnormality. 4. Multilevel cervical degenerative changes as above. Electronically Signed   By: Corlis Leak M.D.   On: 04/30/2018 21:29   Ct Abdomen Pelvis W Contrast  Result Date: 04/30/2018 CLINICAL DATA:  Slip and fall injury. EXAM: CT ABDOMEN AND PELVIS WITH CONTRAST TECHNIQUE: Multidetector CT imaging of the abdomen and pelvis was performed using the standard protocol following bolus administration of intravenous contrast. CONTRAST:  ISOVUE-370 IOPAMIDOL (ISOVUE-370) INJECTION 76% COMPARISON:  None. FINDINGS: Lower chest: Motion artifact limits evaluation. Linear atelectasis in the lung bases. Hepatobiliary: Motion artifact limits evaluation. Several  stones in the gallbladder. No definite gallbladder wall thickening or infiltration. No bile duct dilatation. Benign-appearing cyst in the left lobe of the liver measuring about 3.7 cm diameter. Pancreas: Atrophic appearance of the pancreas. No focal lesions identified. Spleen: Normal in size without focal abnormality. Adrenals/Urinary Tract: Adrenal glands are unremarkable. Kidneys are normal, without renal calculi, focal lesion, or hydronephrosis. Bladder is unremarkable. Stomach/Bowel: Stomach, small bowel, and colon are not abnormally distended. Stool throughout the colon. Prominent stool in the rectum. Right lower quadrant abdominal wall hernia consistent with spigelian hernia containing a portion of the right colon but without proximal obstruction. Vascular/Lymphatic: Tortuous and calcified aorta without aneurysmal dilatation. Inferior vena cava is unremarkable. No significant lymphadenopathy. Reproductive: Somewhat atrophic uterus with calcified fibroids. Suggestion of fluid in the endometrium which would be abnormal in a patient of this age. Consider gynecology examination or follow-up pelvic ultrasound for further evaluation. Other: No free air or free fluid in the abdomen. Musculoskeletal: Degenerative changes throughout the lumbar spine. Comminuted inter trochanteric fractures of the left hip with varus angulation appear acute. No dislocation. Old appearing fracture deformities of the left superior and inferior pubic rami. IMPRESSION: 1. Acute comminuted inter trochanteric fractures of the left hip with varus angulation. No dislocation. 2. Cholelithiasis without evidence of cholecystitis. 3. Right lower quadrant abdominal wall hernia containing a portion of the right colon but without proximal obstruction. This could represent acute or chronic finding. 4. Somewhat atrophic uterus with calcified fibroids. Suggestion of fluid in the endometrium which would be abnormal in a patient of this age. Consider  gynecology examination or follow-up pelvic ultrasound for further evaluation. Aortic Atherosclerosis (ICD10-I70.0). Electronically Signed   By: Burman Nieves M.D.   On: 04/30/2018 21:40   Dg Pelvis Portable  Result Date: 04/30/2018 CLINICAL DATA:  Fall EXAM: PORTABLE PELVIS 1-2 VIEWS COMPARISON:  None. FINDINGS: Acute slightly comminuted fracture involving the left trochanter with cephalad migration of the distal femur. The left femoral head projects in joint. There may be additional left superior pubic ramus fracture. Moderate to marked arthritis of the right hip. IMPRESSION: 1. Acute displaced left trochanteric fracture 2. Possible acute left superior pubic ramus fracture Electronically Signed   By: Jasmine Pang M.D.   On: 04/30/2018 20:49   Ct L-spine No Charge  Result Date: 04/30/2018 CLINICAL DATA:  Initial evaluation for acute trauma, fall. EXAM: CT LUMBAR SPINE WITHOUT CONTRAST TECHNIQUE: Multidetector CT imaging of the lumbar spine was performed without intravenous contrast administration. Multiplanar CT image reconstructions were also generated. COMPARISON:  Concomitant CT of the abdomen and pelvis performed at the same time. Comparison also made with prior CT from 06/21/2015. FINDINGS: Segmentation: Transitional lumbosacral anatomy. For the purposes of this dictation the lowest rib-bearing vertebra will be labeled T12. L5 vertebral body sacralized with rudimentary  L5-S1 joint. Alignment: Levoscoliosis with apex at L2-3. Trace chronic anterolisthesis of L4 on L5, stable. Vertebrae: Vertebral body heights maintained without evidence for acute or chronic fracture. Small chronic endplate Schmorl's nodes present at the superior endplates of L2 and L3. Visualized sacrum and pelvis intact. SI joints approximated and symmetric. No discrete osseous lesions. Diffuse osteopenia. Paraspinal and other soft tissues: Paraspinous soft tissues demonstrate no acute finding. Chronic fatty atrophy noted within the  posterior paraspinous musculature. Large volume stool seen impacted within the rectal vault. Extensive aorto bi-iliac atherosclerotic disease. Disc levels: T12-L1: Chronic intervertebral disc space narrowing with endplate osteophytic spurring. Mild facet hypertrophy. No significant stenosis. L1-2: Chronic intervertebral disc space narrowing with mild diffuse disc bulge and disc desiccation. Mild bilateral facet hypertrophy. No significant spinal stenosis. Moderate to severe right with mild left L1 foraminal stenosis. L2-3: Mild diffuse disc bulge with intervertebral disc space narrowing. Endplate osteophytic spurring anteriorly. Mild bilateral facet hypertrophy. No significant spinal stenosis. Foramina are patent. L3-4: Chronic intervertebral disc space narrowing with bridging syndesmophytes. Moderate facet arthrosis. Mild spinal stenosis with bilateral L3 foraminal narrowing. L4-5: Chronic trace anterolisthesis. Partial ankylosis with bridging syndesmophyte aches per ring. Advanced facet arthrosis. No significant spinal stenosis. Moderate bilateral L4 foraminal narrowing, left greater than right. L5-S1: Transitional lumbosacral anatomy with sacralization of the L5 vertebral body and rudimentary L5-S1 disc. No significant stenosis. IMPRESSION: 1. No acute traumatic injury within the lumbar spine. 2. Levoscoliosis with associated mild to moderate multilevel degenerative spondylolysis and facet arthrosis as above, similar to previous. 3. Large volume stool impacted within the rectal vault. 4. Aortic atherosclerosis. Electronically Signed   By: Rise Mu M.D.   On: 04/30/2018 22:26   Dg Chest Port 1 View  Result Date: 05/03/2018 CLINICAL DATA:  85 year old female with hypoxia. Subsequent encounter. EXAM: PORTABLE CHEST 1 VIEW COMPARISON:  04/30/2008 and 04/22/2017 chest x-ray. FINDINGS: Cardiomegaly. Calcified tortuous aorta. Central pulmonary vascular prominence without pulmonary edema. Right  paratracheal fullness unchanged. No segmental consolidation or pneumothorax. Bilateral shoulder joint degenerative changes. IMPRESSION: 1. No segmental consolidation or pulmonary edema. 2.  Aortic Atherosclerosis (ICD10-I70.0). Calcified tortuous aorta. 3. Prominence right paratracheal region unchanged and may be secondary to vasculature or substernal extension of enlarged thyroid gland. 4. Cardiomegaly. Electronically Signed   By: Lacy Duverney M.D.   On: 05/03/2018 09:38   Dg Chest Port 1 View  Result Date: 04/30/2018 CLINICAL DATA:  Fall EXAM: PORTABLE CHEST 1 VIEW COMPARISON:  04/22/2017, CT cervical spine 06/21/2015, radiograph 05/13/2013 FINDINGS: No acute airspace disease or pleural effusion. Mild cardiomegaly with aortic atherosclerosis. No pneumothorax. Left upper chronic rib fractures. Prominent right paratracheal opacity, likely corresponding to CT demonstrated thyroid masses IMPRESSION: 1. Mild cardiomegaly without acute opacity 2. Similar appearance of right paratracheal mass, presumably corresponding to thyroid mass on previous CT. Electronically Signed   By: Jasmine Pang M.D.   On: 04/30/2018 20:45   Dg Knee Complete 4 Views Left  Result Date: 04/30/2018 CLINICAL DATA:  Fall EXAM: LEFT KNEE - COMPLETE 4+ VIEW COMPARISON:  None. FINDINGS: Bones appear osteopenic. Joint space calcifications. No fracture or malalignment. Moderate degenerative changes of the medial and lateral joint space with mild patellofemoral degenerative change. No knee effusion. Vascular calcification IMPRESSION: Degenerative changes without acute osseous abnormality Electronically Signed   By: Jasmine Pang M.D.   On: 04/30/2018 20:47   Dg C-arm 1-60 Min  Result Date: 05/01/2018 CLINICAL DATA:  ORIF of an intertrochanteric LEFT femoral neck fracture. EXAM: Operative HIP (WITH OR WITHOUT  PELVIS) 2-3V LEFT COMPARISON:  04/30/2018. FINDINGS: 3 spot images from the C-arm fluoroscopic device, AP and LATERAL views of the  LEFT hip are submitted for interpretation post-operatively. ORIF of the intertrochanteric femoral neck fracture with intramedullary nail and compression screw placement. Near anatomic alignment. The radiologic technologist documented 39 seconds of fluoroscopy time period IMPRESSION: Near anatomic alignment post ORIF of the intertrochanteric LEFT femoral neck fracture. Electronically Signed   By: Hulan Saas M.D.   On: 05/01/2018 15:07   Dg Hip Unilat With Pelvis 2-3 Views Left  Result Date: 05/01/2018 CLINICAL DATA:  ORIF of an intertrochanteric LEFT femoral neck fracture. EXAM: Operative HIP (WITH OR WITHOUT PELVIS) 2-3V LEFT COMPARISON:  04/30/2018. FINDINGS: 3 spot images from the C-arm fluoroscopic device, AP and LATERAL views of the LEFT hip are submitted for interpretation post-operatively. ORIF of the intertrochanteric femoral neck fracture with intramedullary nail and compression screw placement. Near anatomic alignment. The radiologic technologist documented 39 seconds of fluoroscopy time period IMPRESSION: Near anatomic alignment post ORIF of the intertrochanteric LEFT femoral neck fracture. Electronically Signed   By: Hulan Saas M.D.   On: 05/01/2018 15:07   Dg Femur Portable Min 2 Views Left  Result Date: 04/30/2018 CLINICAL DATA:  Fall EXAM: LEFT FEMUR PORTABLE 2 VIEWS COMPARISON:  None. FINDINGS: Acute slightly comminuted and displaced left intertrochanteric fracture. Mid to distal femur appear intact. Vascular calcifications. IMPRESSION: Acute slightly comminuted and displaced left intertrochanteric fracture Electronically Signed   By: Jasmine Pang M.D.   On: 04/30/2018 20:46    Lab Data:  CBC: Recent Labs  Lab 05/01/18 0456 05/02/18 0448 05/03/18 0219 05/03/18 1814 05/04/18 0422  WBC 7.0 8.3 10.4 12.8* 12.0*  NEUTROABS  --  5.7 7.7  --   --   HGB 8.9* 7.1* 6.6* 9.6* 9.6*  HCT 27.4* 22.2* 21.2* 30.0* 30.3*  MCV 99.6 101.4* 101.4* 96.2 96.2  PLT 111* 98* 99* 107*  125*   Basic Metabolic Panel: Recent Labs  Lab 04/30/18 2003 04/30/18 2011 05/01/18 0456 05/02/18 0448 05/03/18 0219 05/04/18 0422  NA 140 140 139 140 137 138  K 4.5 4.5 4.2 4.2 4.3 4.3  CL 107 103 104 106 104 104  CO2 27  --  29 27 27 24   GLUCOSE 163* 159* 132* 129* 136* 135*  BUN 22 24* 22 27* 33* 41*  CREATININE 1.20* 1.10* 0.92 1.08* 1.16* 1.12*  CALCIUM 8.8*  --  8.3* 8.1* 8.0* 8.2*   GFR: CrCl cannot be calculated (Unknown ideal weight.). Liver Function Tests: Recent Labs  Lab 04/30/18 2003  AST 17  ALT 10  ALKPHOS 51  BILITOT 0.7  PROT 5.9*  ALBUMIN 3.4*   No results for input(s): LIPASE, AMYLASE in the last 168 hours. No results for input(s): AMMONIA in the last 168 hours. Coagulation Profile: Recent Labs  Lab 04/30/18 2003  INR 1.02   Cardiac Enzymes: Recent Labs  Lab 04/30/18 2338  TROPONINI <0.03   BNP (last 3 results) No results for input(s): PROBNP in the last 8760 hours. HbA1C: No results for input(s): HGBA1C in the last 72 hours. CBG: No results for input(s): GLUCAP in the last 168 hours. Lipid Profile: No results for input(s): CHOL, HDL, LDLCALC, TRIG, CHOLHDL, LDLDIRECT in the last 72 hours. Thyroid Function Tests: No results for input(s): TSH, T4TOTAL, FREET4, T3FREE, THYROIDAB in the last 72 hours. Anemia Panel: No results for input(s): VITAMINB12, FOLATE, FERRITIN, TIBC, IRON, RETICCTPCT in the last 72 hours. Urine analysis:    Component  Value Date/Time   COLORURINE AMBER (A) 04/30/2018 2047   APPEARANCEUR HAZY (A) 04/30/2018 2047   LABSPEC 1.026 04/30/2018 2047   PHURINE 5.0 04/30/2018 2047   GLUCOSEU NEGATIVE 04/30/2018 2047   HGBUR NEGATIVE 04/30/2018 2047   BILIRUBINUR NEGATIVE 04/30/2018 2047   KETONESUR 5 (A) 04/30/2018 2047   PROTEINUR 100 (A) 04/30/2018 2047   UROBILINOGEN 0.2 05/11/2013 1548   NITRITE NEGATIVE 04/30/2018 2047   LEUKOCYTESUR SMALL (A) 04/30/2018 2047     Donovan Persley M.D. [facility] 05/05/2018, 1:58 PM  Pager: [phone] Between 7am to 7pm - call Pager - [phone]  After 7pm go to [email or web address] - password [email or web address]  Call night coverage person covering after 7pm

## 2018-05-05 NOTE — Progress Notes (Signed)
CSW received notification from [facility] that [place] has been received.   CSW contacted Dr. Tana Coast reports patient will stay one more night in hospital and dc tomorrow morning to [facility].   [place], [place]

## 2018-05-05 NOTE — Plan of Care (Signed)
  Problem: Pain Managment: Goal: General experience of comfort will improve Outcome: Progressing   Problem: Safety: Goal: Ability to remain free from injury will improve Outcome: Progressing   

## 2018-05-05 NOTE — Progress Notes (Signed)
CSW spoke with family member Marcie Bal at bedside who reports she would like to be notified if/when patient is discharged to [facility] tomorrow her number is [phone].   [place], [place]

## 2018-05-05 NOTE — Progress Notes (Signed)
Subjective: Resting comfortably.  Family members present.    Objective: Vital signs in last 24 hours: Temp:  [97.6 F (36.4 C)-98.2 F (36.8 C)] 98 F (36.7 C) (01/15 1219) Pulse Rate:  [70-79] 79 (01/15 1219) Resp:  [16-20] 20 (01/15 1219) BP: (124-144)/(57-65) 129/57 (01/15 1219) SpO2:  [91 %-96 %] 94 % (01/15 1219)  Intake/Output from previous day: 01/14 0701 - 01/15 0700 In: 50 [P.O.:50] Out: 401 [Urine:400; Stool:1] Intake/Output this shift: Total I/O In: 245.4 [I.V.:245.4] Out: 100 [Urine:100]  Recent Labs    05/03/18 0219 05/03/18 1814 05/04/18 0422  HGB 6.6* 9.6* 9.6*   Recent Labs    05/03/18 1814 05/04/18 0422  WBC 12.8* 12.0*  RBC 3.12* 3.15*  HCT 30.0* 30.3*  PLT 107* 125*   Recent Labs    05/03/18 0219 05/04/18 0422  NA 137 138  K 4.3 4.3  CL 104 104  CO2 27 24  BUN 33* 41*  CREATININE 1.16* 1.12*  GLUCOSE 136* 135*  CALCIUM 8.0* 8.2*   No results for input(s): LABPT, INR in the last 72 hours.  Exam: Patient alert.  Mild to moderate serous drainage from incision.  No signs of infection.  Staples intact.        Assessment/Plan: Continue present care.  Awaiting d/c planning.    Hannah Arias 05/05/2018, 4:11 PM

## 2018-05-06 ENCOUNTER — Encounter (HOSPITAL_COMMUNITY): Payer: Self-pay | Admitting: Orthopaedic Surgery

## 2018-05-06 LAB — BASIC METABOLIC PANEL
Anion gap: 8 (ref 5–15)
BUN: 52 mg/dL — ABNORMAL HIGH (ref 8–23)
CO2: 26 mmol/L (ref 22–32)
Calcium: 8.4 mg/dL — ABNORMAL LOW (ref 8.9–10.3)
Chloride: 108 mmol/L (ref 98–111)
Creatinine, Ser: 1.01 mg/dL — ABNORMAL HIGH (ref 0.44–1.00)
GFR calc Af Amer: 51 mL/min — ABNORMAL LOW (ref >60)
GFR calc non Af Amer: 44 mL/min — ABNORMAL LOW (ref >60)
Glucose, Bld: 109 mg/dL — ABNORMAL HIGH (ref 70–99)
POTASSIUM: 4.5 mmol/L (ref 3.5–5.1)
Sodium: 142 mmol/L (ref 135–145)

## 2018-05-06 LAB — URINE CULTURE

## 2018-05-06 LAB — CBC
HEMATOCRIT: 30.5 % — AB (ref 36.0–46.0)
Hemoglobin: 9.5 g/dL — ABNORMAL LOW (ref 12.0–15.0)
MCH: 30.6 pg (ref 26.0–34.0)
MCHC: 31.1 g/dL (ref 30.0–36.0)
MCV: 98.4 fL (ref 80.0–100.0)
PLATELETS: 153 10*3/uL (ref 150–400)
RBC: 3.1 MIL/uL — ABNORMAL LOW (ref 3.87–5.11)
RDW: 14.7 % (ref 11.5–15.5)
WBC: 9.7 10*3/uL (ref 4.0–10.5)
nRBC: 0 % (ref 0.0–0.2)

## 2018-05-06 MED ORDER — HYDROCODONE-ACETAMINOPHEN 5-325 MG PO TABS
1.0000 | ORAL_TABLET | Freq: Three times a day (TID) | ORAL | Status: DC | PRN
  Administered 2018-05-07: 1 via ORAL
  Filled 2018-05-06: qty 1

## 2018-05-06 MED ORDER — AMLODIPINE BESYLATE 5 MG PO TABS
5.0000 mg | ORAL_TABLET | Freq: Every day | ORAL | Status: DC
  Administered 2018-05-06 – 2018-05-07 (×2): 5 mg via ORAL
  Filled 2018-05-06 (×2): qty 1

## 2018-05-06 MED ORDER — BUPIVACAINE IN DEXTROSE 0.75-8.25 % IT SOLN
INTRATHECAL | Status: DC | PRN
  Administered 2018-05-01: 1.4 mL via INTRATHECAL

## 2018-05-06 MED ORDER — HYDRALAZINE HCL 20 MG/ML IJ SOLN: 10.0000 mg | Freq: Four times a day (QID) | INTRAMUSCULAR | Status: DC | PRN

## 2018-05-06 MED ORDER — ACETAMINOPHEN 500 MG PO TABS
1000.0000 mg | ORAL_TABLET | Freq: Three times a day (TID) | ORAL | Status: DC
  Administered 2018-05-06 – 2018-05-07 (×3): 1000 mg via ORAL
  Filled 2018-05-06 (×3): qty 2

## 2018-05-06 NOTE — Plan of Care (Signed)
  Problem: Pain Managment: Goal: General experience of comfort will improve Outcome: Progressing   Problem: Safety: Goal: Ability to remain free from injury will improve Outcome: Progressing   

## 2018-05-06 NOTE — Progress Notes (Signed)
   Subjective: 5 Days Post-Op Procedure(s) (LRB): LEFT BIOMET AFFIXUS (IM) SHORT NAIL, INTERTROCHANTRIC FRACTURE (Left) Patient reports pain as mild.    Objective: Vital signs in last 24 hours: Temp:  [97.5 F (36.4 C)-98 F (36.7 C)] 97.6 F (36.4 C) (01/16 0347) Pulse Rate:  [78-80] 80 (01/16 0347) Resp:  [14-20] 15 (01/16 0347) BP: (129-174)/(57-80) 174/80 (01/16 0347) SpO2:  [90 %-94 %] 90 % (01/16 0347)  Intake/Output from previous day: 01/15 0701 - 01/16 0700 In: 480.9 [I.V.:380.9; IV Piggyback:100] Out: 100 [Urine:100] Intake/Output this shift: No intake/output data recorded.  Recent Labs    05/03/18 1814 05/04/18 0422  HGB 9.6* 9.6*   Recent Labs    05/03/18 1814 05/04/18 0422  WBC 12.8* 12.0*  RBC 3.12* 3.15*  HCT 30.0* 30.3*  PLT 107* 125*   Recent Labs    05/04/18 0422  NA 138  K 4.3  CL 104  CO2 24  BUN 41*  CREATININE 1.12*  GLUCOSE 135*  CALCIUM 8.2*   No results for input(s): LABPT, INR in the last 72 hours.  Neurologically intact No results found.  Assessment/Plan: 5 Days Post-Op Procedure(s) (LRB): LEFT BIOMET AFFIXUS (IM) SHORT NAIL, INTERTROCHANTRIC FRACTURE (Left) Up with therapy  OOB to recliner today.   [name] 05/06/2018, 7:55 AM

## 2018-05-06 NOTE — Progress Notes (Signed)
Occupational Therapy Treatment Patient Details Name: Hannah Arias MRN: [id number] DOB: 04-24-1910 Today's Date: 05/06/2018    History of present illness 85 year old female has been healthy and is in independent living with good mental acuity slipped fell suffering a left closed intertrochanteric hip fracture. s/p IM nail 05/02/18. L WBAT   OT comments  Pt received asleep in bed upon arrival. Easily aroused with voice commands to begin therapy. Pt presented with stool and engaged in toileting/ hygiene-clothing management and required total assist due to decreased strength of BUE and core for bed mobility. Pt required Mod A for rolling, VCs for hand placement at hand rails. Max A x2 to EOB. Max A stand pivot transfer to recliner for simulation of toilet transfer (pt completed 2 steps). Pt will continue to benefit from OT to prepare for safe transition to next venue. D/C recommendation remains the appropriate.   Follow Up Recommendations  SNF;Supervision/Assistance - 24 hour    Equipment Recommendations  None recommended by OT    Recommendations for Other Services      Precautions / Restrictions Precautions Precautions: Fall Precaution Comments: fx result of fall Restrictions Weight Bearing Restrictions: Yes LLE Weight Bearing: Weight bearing as tolerated       Mobility Bed Mobility Overal bed mobility: Needs Assistance Bed Mobility: Supine to Sit;Rolling Rolling: Mod assist   Supine to sit: +2 for physical assistance;Max assist Sit to supine: Max assist;+2 for physical assistance   General bed mobility comments: modA for rolling R and L for pericare prior to mobility, x2 for bringing LE of bed, trunk to upright and pad scoot of hips to EoB  Transfers Overall transfer level: Needs assistance Equipment used: Rolling walker (2 wheeled) Transfers: Sit to/from Stand Sit to Stand: +2 physical assistance;Max assist Stand pivot transfers: Max assist;+2 physical assistance        General transfer comment: maxAx2 to come to standing, vc for upright posture    Balance Overall balance assessment: Needs assistance Sitting-balance support: Bilateral upper extremity supported;Feet supported Sitting balance-Leahy Scale: Poor Sitting balance - Comments: requires support to maintain balance   Standing balance support: Bilateral upper extremity supported Standing balance-Leahy Scale: Zero                             ADL either performed or assessed with clinical judgement   ADL Overall ADL's : Needs assistance/impaired             Lower Body Bathing: Total assistance       Lower Body Dressing: Total assistance Lower Body Dressing Details (indicate cue type and reason): Pt requires assistance with LB dressing due to inability bend knees or to reach.  Toilet Transfer: Maximal assistance;+2 for physical assistance;+2 for safety/equipment;Squat-pivot;Cueing for safety;Cueing for sequencing Toilet Transfer Details (indicate cue type and reason): Toilet transfer simulated from bed to recliner. Pt required hand over hand assistance for hand placement to desired seated surface. Improved squat pivot transfer with pt utiliizing LE. Toileting- Clothing Manipulation and Hygiene: Total assistance Toileting - Clothing Manipulation Details (indicate cue type and reason): Pt required total assist with hygiene in bed to lack of arm and core strength to roll for side lying.      Functional mobility during ADLs: Maximal assistance;+2 for physical assistance;+2 for safety/equipment;Cueing for safety;Cueing for sequencing General ADL Comments: Pt demonstrates improved transfer tolerance to recliner to simulate toilet transfer. Good static sitting balance at EOB with the use of BUE for  support.      Vision       Perception     Praxis      Cognition Arousal/Alertness: Lethargic Behavior During Therapy: Flat affect;Anxious Overall Cognitive Status: Within  Functional Limits for tasks assessed                                 General Comments: continues to need cuing for keeping eyes open        Exercises     Shoulder Instructions       General Comments Pt found to be incontinent of stool on entry. Given extensive instruction on calling for help if she knows she has to go to the bathroom so as to reduce likelihood of UTI    Pertinent Vitals/ Pain       Pain Assessment: Faces Faces Pain Scale: Hurts even more Pain Location: L hip with movement Pain Descriptors / Indicators: Grimacing;Guarding;Operative site guarding Pain Intervention(s): Limited activity within patient's tolerance;Monitored during session;Repositioned  Home Living                                          Prior Functioning/Environment              Frequency  Min 2X/week        Progress Toward Goals  OT Goals(current goals can now be found in the care plan section)  Progress towards OT goals: Progressing toward goals  Acute Rehab OT Goals Patient Stated Goal: none stated Time For Goal Achievement: 05/18/18 Potential to Achieve Goals: Fair ADL Goals Pt Will Perform Lower Body Bathing: with adaptive equipment;with mod assist Pt Will Perform Lower Body Dressing: with adaptive equipment;with mod assist Pt Will Transfer to Toilet: with mod assist;stand pivot transfer Pt Will Perform Toileting - Clothing Manipulation and hygiene: with mod assist;with adaptive equipment;bed level Pt Will Perform Tub/Shower Transfer: tub bench;with mod assist  Plan Discharge plan remains appropriate    Co-evaluation    PT/OT/SLP Co-Evaluation/Treatment: Yes Reason for Co-Treatment: Complexity of the patient's impairments (multi-system involvement);For patient/therapist safety   OT goals addressed during session: ADL's and self-care      AM-PAC OT "6 Clicks" Daily Activity     Outcome Measure   Help from another person eating meals?:  A Little Help from another person taking care of personal grooming?: A Lot Help from another person toileting, which includes using toliet, bedpan, or urinal?: Total Help from another person bathing (including washing, rinsing, drying)?: Total Help from another person to put on and taking off regular upper body clothing?: A Little Help from another person to put on and taking off regular lower body clothing?: A Lot 6 Click Score: 12    End of Session Equipment Utilized During Treatment: Gait belt;Rolling walker;Oxygen  OT Visit Diagnosis: Unsteadiness on feet (R26.81);Muscle weakness (generalized) (M62.81)   Activity Tolerance Patient limited by lethargy;Patient limited by fatigue   Patient Left in chair;with call bell/phone within reach;with family/visitor present   Nurse Communication Mobility status;Need for lift equipment;Precautions        Time: 3151-7616 OT Time Calculation (min): 41 min  Charges: OT General Charges $OT Visit: 1 Visit OT Treatments $Self Care/Home Management : 8-22 mins  Hannah Arias, Hannah Arias, Hannah Arias  Supplemental Rehabilitation Services  [phone]   Hannah Arias 05/06/2018, 4:28 PM

## 2018-05-06 NOTE — Progress Notes (Signed)
[facility]                                                                              Patient Demographics  Hannah Arias, is a 85 y.o. female, DOB - October 05, 1910, ZOX:[id number]  Admit date - 04/30/2018   Admitting Physician John Giovanni, MD  Outpatient Primary MD for the patient is Benita Stabile, MD  Outpatient specialists:   LOS - 6  days   Medical records reviewed and are as summarized below:    Chief Complaint  Patient presents with  . Fall       Brief summary   Hannah Arias is a 85 y.o.femalewho lives alone with medical history significant ofchronic diastolic congestive heart failure, GERD presenting to the hospital via EMS after falling at home and experiencing left hip and knee pain. Nursing staff noted dried feces at the bottom of her feet. Found to have a left acute comminuted intertrochanteric fracture, mild AKI, CT showing large volume stool impacted within the rectal vault. She underwent left intertrochanteric nail placement on 1/11.   Assessment & Plan    Principal Problem: Left hip fracture (HCC) -After mechanical fall, status post left anterior trochanteric nail placement on 1/11 -Per family, overnight needed pain medication with narcotic.  -Placed on Tylenol 1000 mg 3 times daily for 2 days, continue low-dose Norco only for severe pain -Per RN, patient was somewhat sleepy, had to be placed on O2, bit confused after Vicodin   Active Problems:   AKI (acute kidney injury) (HCC) on CKD stage III -Creatinine now improving, 1.0  -DC IV fluids  UTI:  UA positive for UTI, continue IV Rocephin, day #2/3 today Urine culture showed multiple bacterial morphotypes.  Will not need any antibiotics at discharge.    Dyspnea and hypoxia on 1/13 -Chest x-ray showed no infiltrates, CT angiogram showed no PE, no pleural effusion, mild right posterior basilar subsegmental atelectasis, no pneumothorax -Currently on O2 2 L  Acute blood loss  anemia on chronic anemia -Baseline hemoglobin 10-11, dropped to 6.6 on 1/13, status post 2 units packed RBCs -H&H stable 9.5  Chronic thrombocytopenia -Platelets stable, improving  Constipation Patient started on Colace, MiraLAX daily  Right paratracheal mass, atrophic uterus with calcified fibroid -Noted incidentally on the imaging  Chronic diastolic CHF Grade 1 per echo in 2015, not on diuretics at home  Hypertension -BP elevated this morning, will DC IV fluids, placed on Norvasc 5 mg daily, IV hydralazine as needed with parameters  Code Status: DNR DVT Prophylaxis:   SCD's Family Communication: Discussed in detail with the patient, all imaging results, lab results explained to the patient, 2 sons  and daughter-in-law at the bedside   Disposition Plan: Discussed with patient's family at the bedside, explained tiers of rehab/NH/SNF and home health.  Patient's family had been thinking of appealing the discharge as they did not understand the skilled part of the rehab facility.  Now assured, requested discharge in a.m.  Time Spent in minutes 35 minutes  Procedures:  /11- Left trochanteric nail, short 11 mm diameter with 105 mm lag screw and interlock distal screw  Consultants:  Orthopedics  Antimicrobials:   Anti-infectives (From admission, onward)   Start     Dose/Rate Route Frequency Ordered Stop   05/05/18 1730  cefTRIAXone (ROCEPHIN) 1 g in sodium chloride 0.9 % 100 mL IVPB     1 g 200 mL/hr over 30 Minutes Intravenous Every 24 hours 05/05/18 1626     05/01/18 0600  ceFAZolin (ANCEF) IVPB 2g/100 mL premix     2 g 200 mL/hr over 30 Minutes Intravenous On call to O.R. 05/01/18 0039 05/01/18 6644         Medications  Scheduled Meds: . acetaminophen  1,000 mg Oral TID  . pantoprazole  40 mg Oral Daily  . polyethylene glycol  17 g Oral Daily  . povidone-iodine  2 application Topical Once  . senna-docusate  1 tablet Oral BID   Continuous Infusions: . sodium  chloride Stopped (05/05/18 1708)  . cefTRIAXone (ROCEPHIN)  IV 1 g (05/05/18 1721)   PRN Meds:.acetaminophen, albuterol, HYDROcodone-acetaminophen      Subjective:   Hannah Arias was seen and examined today.  Breathing through her mouth, per granddaughter at the bedside, this is normal for the patient.  Appears to be somewhat uncomfortable.  Family concerned about dark urine.  States appetite has started improving today.  Pain controlled.    Objective:   Vitals:   05/05/18 1219 05/05/18 1700 05/05/18 2008 05/06/18 0347  BP: (!) 129/57  (!) 162/79 (!) 174/80  Pulse: 79  78 80  Resp: 20  14 15   Temp: 98 F (36.7 C)  (!) 97.5 F (36.4 C) 97.6 F (36.4 C)  TempSrc: Oral  Oral Oral  SpO2: 94% 91% 91% 90%    Intake/Output Summary (Last 24 hours) at 05/06/2018 1153 Last data filed at 05/06/2018 0700 Gross per 24 hour  Intake 580.88 ml  Output 100 ml  Net 480.88 ml     Wt Readings from Last 3 Encounters:  03/31/18 72.1 kg  04/22/17 68 kg  06/21/15 72.6 kg    Physical Exam  General: Alert, oriented x2, NAD  Eyes:   HEENT:  Atraumatic, normocephalic  Cardiovascular: S1 S2 clear,RRR. No pedal edema b/l  Respiratory: CTAB, no wheezing, rales or rhonchi  Gastrointestinal: Soft, nontender, nondistended, NBS  Ext: no pedal edema bilaterally  Neuro: no new deficits  Musculoskeletal: No cyanosis, clubbing  Skin: Left thigh staples intact  Psych: Alert and oriented x2    Data Reviewed:  I have personally reviewed following labs and imaging studies  Micro Results Recent Results (from the past 240 hour(s))  Surgical PCR screen     Status: None   Collection Time: 05/01/18  3:27 AM  Result Value Ref Range Status   MRSA, PCR NEGATIVE NEGATIVE Final   Staphylococcus aureus NEGATIVE NEGATIVE Final    Comment: (NOTE) The Xpert SA Assay (FDA approved for NASAL specimens in patients 63 years of age and older), is one component of a comprehensive surveillance program.  It is not intended to diagnose infection nor to guide or monitor treatment. Performed at [facility], [address], [place], [place] [address]   Urine Culture     Status: None   Collection Time: 05/05/18  2:05 PM  Result Value Ref Range Status   Specimen Description URINE, RANDOM  Final   Special Requests   Final    NONE Performed at [facility], [address] [place], [place] [address]    Culture   Final    Multiple bacterial morphotypes  present, none predominant. Suggest appropriate recollection if clinically indicated.   Report Status 05/06/2018 FINAL  Final    Radiology Reports Dg Tibia/fibula Left  Result Date: 04/30/2018 CLINICAL DATA:  Fall EXAM: LEFT TIBIA AND FIBULA - 2 VIEW COMPARISON:  None. FINDINGS: Joint space calcifications at the knee. No acute displaced fracture or malalignment. Diffuse soft tissue swelling. Vascular calcifications. IMPRESSION: No acute osseous abnormality.  Chondrocalcinosis at the knee Electronically Signed   By: Jasmine Pang M.D.   On: 04/30/2018 20:50   Dg Ankle Complete Left  Result Date: 04/30/2018 CLINICAL DATA:  Fall with left-sided leg pain EXAM: LEFT ANKLE COMPLETE - 3+ VIEW COMPARISON:  None. FINDINGS: Diffuse soft tissue edema. Bones appear osteopenic. No fracture or malalignment. Moderate plantar calcaneal spur. IMPRESSION: No acute osseous abnormality. Electronically Signed   By: Jasmine Pang M.D.   On: 04/30/2018 20:51   Ct Head Wo Contrast  Result Date: 04/30/2018 CLINICAL DATA:  Slipped and fell EXAM: CT HEAD WITHOUT CONTRAST CT CERVICAL SPINE WITHOUT CONTRAST TECHNIQUE: Multidetector CT imaging of the head and cervical spine was performed following the standard protocol without intravenous contrast. Multiplanar CT image reconstructions of the cervical spine were also generated. COMPARISON:  None. FINDINGS: CT HEAD FINDINGS Brain: Diffuse parenchymal atrophy. Patchy areas of hypoattenuation in deep and periventricular  white matter bilaterally. Negative for acute intracranial hemorrhage, mass lesion, acute infarction, midline shift, or mass-effect. Acute infarct may be inapparent on noncontrast CT. Ventricles and sulci symmetric. Bilateral basal ganglia mineralization. Vascular: Atherosclerotic and physiologic intracranial calcifications. Skull: Normal. Negative for fracture or focal lesion. Sinuses/Orbits: Mucoperiosteal thickening in the left maxillary sinus. Missing dentition. Other: Left frontal scalp soft tissue swelling. CT CERVICAL SPINE FINDINGS Alignment: Mild cervical dextroscoliosis. No significant spondylolisthesis, with some degradation of reconstructions due to motion. Skull base and vertebrae: Negative for fracture or focal bone lesion. Soft tissues and spinal canal: No visible canal hematoma. No prevertebral fluid or gas. Disc levels: Asymmetric facet DJD C2-T1, left worse than right. Narrowing of interspaces C4-C7 with small endplate spurs. Upper chest: Negative. Other: Bilateral calcified carotid bifurcation plaque. IMPRESSION: 1. Negative for bleed or other acute intracranial process. 2. Atrophy and nonspecific white matter changes. 3. Negative for cervical fracture or other acute bone abnormality. 4. Multilevel cervical degenerative changes as above. Electronically Signed   By: Corlis Leak M.D.   On: 04/30/2018 21:29   Ct Chest W Contrast  Result Date: 05/03/2018 CLINICAL DATA:  Shortness of breath. EXAM: CT CHEST WITH CONTRAST TECHNIQUE: Multidetector CT imaging of the chest was performed during intravenous contrast administration. CONTRAST:  OMNIPAQUE IOHEXOL 300 MG/ML  SOLN COMPARISON:  Radiograph of same day. FINDINGS: Cardiovascular: Atherosclerosis of thoracic aorta is noted without aneurysm or dissection. Normal cardiac size. No pericardial effusion. Mediastinum/Nodes: 2.2 cm right thyroid nodule is noted. No adenopathy is noted. Esophagus is unremarkable. Lungs/Pleura: No pneumothorax is noted.  Mild right posterior basilar subsegmental atelectasis is noted. No significant pleural effusion is noted. Upper Abdomen: No acute abnormality. Musculoskeletal: No chest wall abnormality. No acute or significant osseous findings. IMPRESSION: 2.2 cm right thyroid nodule is noted. Thyroid ultrasound is recommended for further evaluation. Minimal right posterior basilar subsegmental atelectasis. Aortic Atherosclerosis (ICD10-I70.0). Electronically Signed   By: Lupita Raider, M.D.   On: 05/03/2018 15:24   Ct Cervical Spine Wo Contrast  Result Date: 04/30/2018 CLINICAL DATA:  Slipped and fell EXAM: CT HEAD WITHOUT CONTRAST CT CERVICAL SPINE WITHOUT CONTRAST TECHNIQUE: Multidetector CT imaging of the head and  cervical spine was performed following the standard protocol without intravenous contrast. Multiplanar CT image reconstructions of the cervical spine were also generated. COMPARISON:  None. FINDINGS: CT HEAD FINDINGS Brain: Diffuse parenchymal atrophy. Patchy areas of hypoattenuation in deep and periventricular white matter bilaterally. Negative for acute intracranial hemorrhage, mass lesion, acute infarction, midline shift, or mass-effect. Acute infarct may be inapparent on noncontrast CT. Ventricles and sulci symmetric. Bilateral basal ganglia mineralization. Vascular: Atherosclerotic and physiologic intracranial calcifications. Skull: Normal. Negative for fracture or focal lesion. Sinuses/Orbits: Mucoperiosteal thickening in the left maxillary sinus. Missing dentition. Other: Left frontal scalp soft tissue swelling. CT CERVICAL SPINE FINDINGS Alignment: Mild cervical dextroscoliosis. No significant spondylolisthesis, with some degradation of reconstructions due to motion. Skull base and vertebrae: Negative for fracture or focal bone lesion. Soft tissues and spinal canal: No visible canal hematoma. No prevertebral fluid or gas. Disc levels: Asymmetric facet DJD C2-T1, left worse than right. Narrowing of  interspaces C4-C7 with small endplate spurs. Upper chest: Negative. Other: Bilateral calcified carotid bifurcation plaque. IMPRESSION: 1. Negative for bleed or other acute intracranial process. 2. Atrophy and nonspecific white matter changes. 3. Negative for cervical fracture or other acute bone abnormality. 4. Multilevel cervical degenerative changes as above. Electronically Signed   By: Corlis Leak M.D.   On: 04/30/2018 21:29   Ct Abdomen Pelvis W Contrast  Result Date: 04/30/2018 CLINICAL DATA:  Slip and fall injury. EXAM: CT ABDOMEN AND PELVIS WITH CONTRAST TECHNIQUE: Multidetector CT imaging of the abdomen and pelvis was performed using the standard protocol following bolus administration of intravenous contrast. CONTRAST:  ISOVUE-370 IOPAMIDOL (ISOVUE-370) INJECTION 76% COMPARISON:  None. FINDINGS: Lower chest: Motion artifact limits evaluation. Linear atelectasis in the lung bases. Hepatobiliary: Motion artifact limits evaluation. Several stones in the gallbladder. No definite gallbladder wall thickening or infiltration. No bile duct dilatation. Benign-appearing cyst in the left lobe of the liver measuring about 3.7 cm diameter. Pancreas: Atrophic appearance of the pancreas. No focal lesions identified. Spleen: Normal in size without focal abnormality. Adrenals/Urinary Tract: Adrenal glands are unremarkable. Kidneys are normal, without renal calculi, focal lesion, or hydronephrosis. Bladder is unremarkable. Stomach/Bowel: Stomach, small bowel, and colon are not abnormally distended. Stool throughout the colon. Prominent stool in the rectum. Right lower quadrant abdominal wall hernia consistent with spigelian hernia containing a portion of the right colon but without proximal obstruction. Vascular/Lymphatic: Tortuous and calcified aorta without aneurysmal dilatation. Inferior vena cava is unremarkable. No significant lymphadenopathy. Reproductive: Somewhat atrophic uterus with calcified fibroids.  Suggestion of fluid in the endometrium which would be abnormal in a patient of this age. Consider gynecology examination or follow-up pelvic ultrasound for further evaluation. Other: No free air or free fluid in the abdomen. Musculoskeletal: Degenerative changes throughout the lumbar spine. Comminuted inter trochanteric fractures of the left hip with varus angulation appear acute. No dislocation. Old appearing fracture deformities of the left superior and inferior pubic rami. IMPRESSION: 1. Acute comminuted inter trochanteric fractures of the left hip with varus angulation. No dislocation. 2. Cholelithiasis without evidence of cholecystitis. 3. Right lower quadrant abdominal wall hernia containing a portion of the right colon but without proximal obstruction. This could represent acute or chronic finding. 4. Somewhat atrophic uterus with calcified fibroids. Suggestion of fluid in the endometrium which would be abnormal in a patient of this age. Consider gynecology examination or follow-up pelvic ultrasound for further evaluation. Aortic Atherosclerosis (ICD10-I70.0). Electronically Signed   By: Burman Nieves M.D.   On: 04/30/2018 21:40   Dg Pelvis  Portable  Result Date: 04/30/2018 CLINICAL DATA:  Fall EXAM: PORTABLE PELVIS 1-2 VIEWS COMPARISON:  None. FINDINGS: Acute slightly comminuted fracture involving the left trochanter with cephalad migration of the distal femur. The left femoral head projects in joint. There may be additional left superior pubic ramus fracture. Moderate to marked arthritis of the right hip. IMPRESSION: 1. Acute displaced left trochanteric fracture 2. Possible acute left superior pubic ramus fracture Electronically Signed   By: Jasmine Pang M.D.   On: 04/30/2018 20:49   Ct L-spine No Charge  Result Date: 04/30/2018 CLINICAL DATA:  Initial evaluation for acute trauma, fall. EXAM: CT LUMBAR SPINE WITHOUT CONTRAST TECHNIQUE: Multidetector CT imaging of the lumbar spine was performed  without intravenous contrast administration. Multiplanar CT image reconstructions were also generated. COMPARISON:  Concomitant CT of the abdomen and pelvis performed at the same time. Comparison also made with prior CT from 06/21/2015. FINDINGS: Segmentation: Transitional lumbosacral anatomy. For the purposes of this dictation the lowest rib-bearing vertebra will be labeled T12. L5 vertebral body sacralized with rudimentary L5-S1 joint. Alignment: Levoscoliosis with apex at L2-3. Trace chronic anterolisthesis of L4 on L5, stable. Vertebrae: Vertebral body heights maintained without evidence for acute or chronic fracture. Small chronic endplate Schmorl's nodes present at the superior endplates of L2 and L3. Visualized sacrum and pelvis intact. SI joints approximated and symmetric. No discrete osseous lesions. Diffuse osteopenia. Paraspinal and other soft tissues: Paraspinous soft tissues demonstrate no acute finding. Chronic fatty atrophy noted within the posterior paraspinous musculature. Large volume stool seen impacted within the rectal vault. Extensive aorto bi-iliac atherosclerotic disease. Disc levels: T12-L1: Chronic intervertebral disc space narrowing with endplate osteophytic spurring. Mild facet hypertrophy. No significant stenosis. L1-2: Chronic intervertebral disc space narrowing with mild diffuse disc bulge and disc desiccation. Mild bilateral facet hypertrophy. No significant spinal stenosis. Moderate to severe right with mild left L1 foraminal stenosis. L2-3: Mild diffuse disc bulge with intervertebral disc space narrowing. Endplate osteophytic spurring anteriorly. Mild bilateral facet hypertrophy. No significant spinal stenosis. Foramina are patent. L3-4: Chronic intervertebral disc space narrowing with bridging syndesmophytes. Moderate facet arthrosis. Mild spinal stenosis with bilateral L3 foraminal narrowing. L4-5: Chronic trace anterolisthesis. Partial ankylosis with bridging syndesmophyte aches  per ring. Advanced facet arthrosis. No significant spinal stenosis. Moderate bilateral L4 foraminal narrowing, left greater than right. L5-S1: Transitional lumbosacral anatomy with sacralization of the L5 vertebral body and rudimentary L5-S1 disc. No significant stenosis. IMPRESSION: 1. No acute traumatic injury within the lumbar spine. 2. Levoscoliosis with associated mild to moderate multilevel degenerative spondylolysis and facet arthrosis as above, similar to previous. 3. Large volume stool impacted within the rectal vault. 4. Aortic atherosclerosis. Electronically Signed   By: Rise Mu M.D.   On: 04/30/2018 22:26   Dg Chest Port 1 View  Result Date: 05/03/2018 CLINICAL DATA:  85 year old female with hypoxia. Subsequent encounter. EXAM: PORTABLE CHEST 1 VIEW COMPARISON:  04/30/2008 and 04/22/2017 chest x-ray. FINDINGS: Cardiomegaly. Calcified tortuous aorta. Central pulmonary vascular prominence without pulmonary edema. Right paratracheal fullness unchanged. No segmental consolidation or pneumothorax. Bilateral shoulder joint degenerative changes. IMPRESSION: 1. No segmental consolidation or pulmonary edema. 2.  Aortic Atherosclerosis (ICD10-I70.0). Calcified tortuous aorta. 3. Prominence right paratracheal region unchanged and may be secondary to vasculature or substernal extension of enlarged thyroid gland. 4. Cardiomegaly. Electronically Signed   By: Lacy Duverney M.D.   On: 05/03/2018 09:38   Dg Chest Port 1 View  Result Date: 04/30/2018 CLINICAL DATA:  Fall EXAM: PORTABLE CHEST 1 VIEW COMPARISON:  04/22/2017, CT cervical spine 06/21/2015, radiograph 05/13/2013 FINDINGS: No acute airspace disease or pleural effusion. Mild cardiomegaly with aortic atherosclerosis. No pneumothorax. Left upper chronic rib fractures. Prominent right paratracheal opacity, likely corresponding to CT demonstrated thyroid masses IMPRESSION: 1. Mild cardiomegaly without acute opacity 2. Similar appearance of  right paratracheal mass, presumably corresponding to thyroid mass on previous CT. Electronically Signed   By: Jasmine Pang M.D.   On: 04/30/2018 20:45   Dg Knee Complete 4 Views Left  Result Date: 04/30/2018 CLINICAL DATA:  Fall EXAM: LEFT KNEE - COMPLETE 4+ VIEW COMPARISON:  None. FINDINGS: Bones appear osteopenic. Joint space calcifications. No fracture or malalignment. Moderate degenerative changes of the medial and lateral joint space with mild patellofemoral degenerative change. No knee effusion. Vascular calcification IMPRESSION: Degenerative changes without acute osseous abnormality Electronically Signed   By: Jasmine Pang M.D.   On: 04/30/2018 20:47   Dg C-arm 1-60 Min  Result Date: 05/01/2018 CLINICAL DATA:  ORIF of an intertrochanteric LEFT femoral neck fracture. EXAM: Operative HIP (WITH OR WITHOUT PELVIS) 2-3V LEFT COMPARISON:  04/30/2018. FINDINGS: 3 spot images from the C-arm fluoroscopic device, AP and LATERAL views of the LEFT hip are submitted for interpretation post-operatively. ORIF of the intertrochanteric femoral neck fracture with intramedullary nail and compression screw placement. Near anatomic alignment. The radiologic technologist documented 39 seconds of fluoroscopy time period IMPRESSION: Near anatomic alignment post ORIF of the intertrochanteric LEFT femoral neck fracture. Electronically Signed   By: Hulan Saas M.D.   On: 05/01/2018 15:07   Dg Hip Unilat With Pelvis 2-3 Views Left  Result Date: 05/01/2018 CLINICAL DATA:  ORIF of an intertrochanteric LEFT femoral neck fracture. EXAM: Operative HIP (WITH OR WITHOUT PELVIS) 2-3V LEFT COMPARISON:  04/30/2018. FINDINGS: 3 spot images from the C-arm fluoroscopic device, AP and LATERAL views of the LEFT hip are submitted for interpretation post-operatively. ORIF of the intertrochanteric femoral neck fracture with intramedullary nail and compression screw placement. Near anatomic alignment. The radiologic technologist  documented 39 seconds of fluoroscopy time period IMPRESSION: Near anatomic alignment post ORIF of the intertrochanteric LEFT femoral neck fracture. Electronically Signed   By: Hulan Saas M.D.   On: 05/01/2018 15:07   Dg Femur Portable Min 2 Views Left  Result Date: 04/30/2018 CLINICAL DATA:  Fall EXAM: LEFT FEMUR PORTABLE 2 VIEWS COMPARISON:  None. FINDINGS: Acute slightly comminuted and displaced left intertrochanteric fracture. Mid to distal femur appear intact. Vascular calcifications. IMPRESSION: Acute slightly comminuted and displaced left intertrochanteric fracture Electronically Signed   By: Jasmine Pang M.D.   On: 04/30/2018 20:46    Lab Data:  CBC: Recent Labs  Lab 05/02/18 0448 05/03/18 0219 05/03/18 1814 05/04/18 0422 05/06/18 0935  WBC 8.3 10.4 12.8* 12.0* 9.7  NEUTROABS 5.7 7.7  --   --   --   HGB 7.1* 6.6* 9.6* 9.6* 9.5*  HCT 22.2* 21.2* 30.0* 30.3* 30.5*  MCV 101.4* 101.4* 96.2 96.2 98.4  PLT 98* 99* 107* 125* 153   Basic Metabolic Panel: Recent Labs  Lab 05/01/18 0456 05/02/18 0448 05/03/18 0219 05/04/18 0422 05/06/18 0935  NA 139 140 137 138 142  K 4.2 4.2 4.3 4.3 4.5  CL 104 106 104 104 108  CO2 29 27 27 24 26   GLUCOSE 132* 129* 136* 135* 109*  BUN 22 27* 33* 41* 52*  CREATININE 0.92 1.08* 1.16* 1.12* 1.01*  CALCIUM 8.3* 8.1* 8.0* 8.2* 8.4*   GFR: CrCl cannot be calculated (Unknown ideal weight.). Liver Function Tests: Recent  Labs  Lab 04/30/18 2003  AST 17  ALT 10  ALKPHOS 51  BILITOT 0.7  PROT 5.9*  ALBUMIN 3.4*   No results for input(s): LIPASE, AMYLASE in the last 168 hours. No results for input(s): AMMONIA in the last 168 hours. Coagulation Profile: Recent Labs  Lab 04/30/18 2003  INR 1.02   Cardiac Enzymes: Recent Labs  Lab 04/30/18 2338  TROPONINI <0.03   BNP (last 3 results) No results for input(s): PROBNP in the last 8760 hours. HbA1C: No results for input(s): HGBA1C in the last 72 hours. CBG: No results for  input(s): GLUCAP in the last 168 hours. Lipid Profile: No results for input(s): CHOL, HDL, LDLCALC, TRIG, CHOLHDL, LDLDIRECT in the last 72 hours. Thyroid Function Tests: No results for input(s): TSH, T4TOTAL, FREET4, T3FREE, THYROIDAB in the last 72 hours. Anemia Panel: No results for input(s): VITAMINB12, FOLATE, FERRITIN, TIBC, IRON, RETICCTPCT in the last 72 hours. Urine analysis:    Component Value Date/Time   COLORURINE AMBER (A) 05/05/2018 1446   APPEARANCEUR CLOUDY (A) 05/05/2018 1446   LABSPEC 1.028 05/05/2018 1446   PHURINE 5.0 05/05/2018 1446   GLUCOSEU NEGATIVE 05/05/2018 1446   HGBUR MODERATE (A) 05/05/2018 1446   BILIRUBINUR NEGATIVE 05/05/2018 1446   KETONESUR NEGATIVE 05/05/2018 1446   PROTEINUR 30 (A) 05/05/2018 1446   UROBILINOGEN 0.2 05/11/2013 1548   NITRITE NEGATIVE 05/05/2018 1446   LEUKOCYTESUR MODERATE (A) 05/05/2018 1446     Milton Streicher M.D. [facility] 05/06/2018, 11:53 AM  Pager: [phone] Between 7am to 7pm - call Pager - [phone]  After 7pm go to [email or web address] - password [email or web address]  Call night coverage person covering after 7pm

## 2018-05-06 NOTE — Anesthesia Postprocedure Evaluation (Signed)
Anesthesia Post Note  Patient: Avanell Banwart Ehly  Procedure(s) Performed: LEFT BIOMET AFFIXUS (IM) SHORT NAIL, INTERTROCHANTRIC FRACTURE (Left Hip)     Patient location during evaluation: PACU Anesthesia Type: MAC and Spinal Level of consciousness: awake and alert Pain management: pain level controlled Vital Signs Assessment: post-procedure vital signs reviewed and stable Respiratory status: spontaneous breathing, nonlabored ventilation, respiratory function stable and patient connected to nasal cannula oxygen Cardiovascular status: stable and blood pressure returned to baseline Postop Assessment: no apparent nausea or vomiting and spinal receding Anesthetic complications: no    Last Vitals:  Vitals:   05/05/18 2008 05/06/18 0347  BP: (!) 162/79 (!) 174/80  Pulse: 78 80  Resp: 14 15  Temp: (!) 36.4 C 36.4 C  SpO2: 91% 90%    Last Pain:  Vitals:   05/06/18 0347  TempSrc: Oral  PainSc:                  Jacqualin Shirkey

## 2018-05-06 NOTE — Progress Notes (Signed)
Physical Therapy Treatment Patient Details Name: Hannah Arias MRN: [id number] DOB: [birth year] Today's Date: 05/06/2018    History of Present Illness 85 year old female has been healthy and is in independent living with good mental acuity slipped fell suffering a left closed intertrochanteric hip fracture. s/p IM nail 05/02/18. L WBAT    PT Comments    Pt asleep on entry however is easily roused and agreeable to getting up with therapy. Pt found to be incontinent of stool on entry, encouraged pt to call for assist when she feels the need to have a BM. Pt requires modA for rolling, maxAx2 for coming to EoB, sit>stand and for 2 steps to recliner. D/c plans remain appropriate. PT will continue to follow acutely.   Follow Up Recommendations  SNF     Equipment Recommendations  Other (comment)(TBD at next venue)    Recommendations for Other Services       Precautions / Restrictions Precautions Precautions: Fall Precaution Comments: fx result of fall Restrictions Weight Bearing Restrictions: Yes LLE Weight Bearing: Non weight bearing    Mobility  Bed Mobility Overal bed mobility: Needs Assistance Bed Mobility: Supine to Sit;Rolling Rolling: Mod assist   Supine to sit: +2 for physical assistance;Mod assist     General bed mobility comments: modA for rolling R and L for pericare prior to mobility, x2 for bringing LE of bed, trunk to upright and pad scoot of hips to EoB  Transfers Overall transfer level: Needs assistance Equipment used: Rolling walker (2 wheeled) Transfers: Sit to/from Stand Sit to Stand: +2 physical assistance;Max assist         General transfer comment: maxAx2 to come to standing, vc for upright posture  Ambulation/Gait Ambulation/Gait assistance: Max assist;+2 physical assistance Gait Distance (Feet): 2 Feet Assistive device: 2 person hand held assist Gait Pattern/deviations: Step-to pattern;Decreased weight shift to left;Shuffle;Trunk  flexed;Antalgic Gait velocity: slowed Gait velocity interpretation: <1.31 ft/sec, indicative of household ambulator General Gait Details: Pt with increased ability to come to upright and was able to take 2 steps towards chair today        Balance Overall balance assessment: Needs assistance Sitting-balance support: Bilateral upper extremity supported;Feet supported Sitting balance-Leahy Scale: Poor Sitting balance - Comments: requires support to maintain balance   Standing balance support: Bilateral upper extremity supported Standing balance-Leahy Scale: Zero                              Cognition Arousal/Alertness: Lethargic Behavior During Therapy: Flat affect;Anxious Overall Cognitive Status: Within Functional Limits for tasks assessed                                 General Comments: continues to need cuing for keeping eyes open         General Comments General comments (skin integrity, edema, etc.): Pt found to be incontinent of stool on entry. Given extensive instruction on calling for help if she knows she has to go to the bathroom so as to reduce likelihood of UTI      Pertinent Vitals/Pain Pain Assessment: Faces Faces Pain Scale: Hurts even more Pain Location: L hip with movement, and stomach after moving Pain Descriptors / Indicators: Grimacing;Guarding;Operative site guarding Pain Intervention(s): Limited activity within patient's tolerance;Monitored during session;Repositioned           PT Goals (current goals can now be found in the care plan  section) Acute Rehab PT Goals Patient Stated Goal: none stated PT Goal Formulation: With patient/family Time For Goal Achievement: 05/17/18 Potential to Achieve Goals: Fair    Frequency    Min 3X/week      PT Plan Current plan remains appropriate    AM-PAC PT "6 Clicks" Mobility   Outcome Measure  Help needed turning from your back to your side while in a flat bed without using  bedrails?: A Lot Help needed moving from lying on your back to sitting on the side of a flat bed without using bedrails?: A Lot Help needed moving to and from a bed to a chair (including a wheelchair)?: Total Help needed standing up from a chair using your arms (e.g., wheelchair or bedside chair)?: Total Help needed to walk in hospital room?: Total Help needed climbing 3-5 steps with a railing? : Total 6 Click Score: 8    End of Session Equipment Utilized During Treatment: Gait belt Activity Tolerance: Patient limited by fatigue;Patient limited by pain Patient left: in bed;with call bell/phone within reach;with bed alarm set;with family/visitor present Nurse Communication: Mobility status PT Visit Diagnosis: Unsteadiness on feet (R26.81);Other abnormalities of gait and mobility (R26.89);Muscle weakness (generalized) (M62.81);History of falling (Z91.81);Difficulty in walking, not elsewhere classified (R26.2);Pain Pain - Right/Left: Left Pain - part of body: Hip     Time: 1610-9604 PT Time Calculation (min) (ACUTE ONLY): 37 min  Charges:  $Therapeutic Activity: 8-22 mins                     Ermine Stebbins B. Beverely Risen PT, DPT Acute Rehabilitation Services Pager [phone] Office [phone]    Elon Alas Fleet 05/06/2018, 2:31 PM

## 2018-05-06 NOTE — Anesthesia Procedure Notes (Signed)
Spinal  Patient location during procedure: OR Start time: 05/01/2018 12:30 PM End time: 05/01/2018 12:45 PM Staffing Anesthesiologist: Oleta Mouse, MD Performed: anesthesiologist  Preanesthetic Checklist Completed: patient identified, surgical consent, pre-op evaluation, timeout performed, IV checked, risks and benefits discussed and monitors and equipment checked Spinal Block Patient position: left lateral decubitus Prep: DuraPrep Patient monitoring: heart rate, cardiac monitor, continuous pulse ox and blood pressure Approach: midline Location: L4-5 Injection technique: single-shot Needle Needle type: Pencan  Needle gauge: 24 G Needle length: 9 cm Assessment Sensory level: T6

## 2018-05-07 ENCOUNTER — Encounter: Payer: Self-pay | Admitting: Internal Medicine

## 2018-05-07 ENCOUNTER — Non-Acute Institutional Stay (SKILLED_NURSING_FACILITY): Payer: Medicare HMO | Admitting: Internal Medicine

## 2018-05-07 ENCOUNTER — Inpatient Hospital Stay
Admission: RE | Admit: 2018-05-07 | Discharge: 2018-06-20 | Disposition: A | Discharge status: Nursing Home (Includes ICF) | Payer: Medicare HMO | Source: Ambulatory Visit | Attending: Internal Medicine | Admitting: Internal Medicine

## 2018-05-07 DIAGNOSIS — I10 Essential (primary) hypertension: Secondary | ICD-10-CM | POA: Diagnosis not present

## 2018-05-07 DIAGNOSIS — R262 Difficulty in walking, not elsewhere classified: Secondary | ICD-10-CM | POA: Diagnosis not present

## 2018-05-07 DIAGNOSIS — Z9981 Dependence on supplemental oxygen: Secondary | ICD-10-CM | POA: Diagnosis not present

## 2018-05-07 DIAGNOSIS — N179 Acute kidney failure, unspecified: Secondary | ICD-10-CM | POA: Diagnosis not present

## 2018-05-07 DIAGNOSIS — S72002S Fracture of unspecified part of neck of left femur, sequela: Secondary | ICD-10-CM | POA: Diagnosis not present

## 2018-05-07 DIAGNOSIS — J9601 Acute respiratory failure with hypoxia: Secondary | ICD-10-CM | POA: Diagnosis not present

## 2018-05-07 DIAGNOSIS — D62 Acute posthemorrhagic anemia: Secondary | ICD-10-CM

## 2018-05-07 DIAGNOSIS — I5032 Chronic diastolic (congestive) heart failure: Secondary | ICD-10-CM

## 2018-05-07 DIAGNOSIS — K5900 Constipation, unspecified: Secondary | ICD-10-CM | POA: Diagnosis not present

## 2018-05-07 DIAGNOSIS — Z4789 Encounter for other orthopedic aftercare: Secondary | ICD-10-CM | POA: Diagnosis not present

## 2018-05-07 DIAGNOSIS — Z8781 Personal history of (healed) traumatic fracture: Secondary | ICD-10-CM

## 2018-05-07 DIAGNOSIS — R1312 Dysphagia, oropharyngeal phase: Secondary | ICD-10-CM | POA: Diagnosis not present

## 2018-05-07 DIAGNOSIS — R0902 Hypoxemia: Secondary | ICD-10-CM | POA: Diagnosis not present

## 2018-05-07 DIAGNOSIS — M6281 Muscle weakness (generalized): Secondary | ICD-10-CM | POA: Diagnosis not present

## 2018-05-07 DIAGNOSIS — S72142D Displaced intertrochanteric fracture of left femur, subsequent encounter for closed fracture with routine healing: Secondary | ICD-10-CM | POA: Diagnosis not present

## 2018-05-07 DIAGNOSIS — M25552 Pain in left hip: Secondary | ICD-10-CM | POA: Diagnosis not present

## 2018-05-07 DIAGNOSIS — I130 Hypertensive heart and chronic kidney disease with heart failure and stage 1 through stage 4 chronic kidney disease, or unspecified chronic kidney disease: Secondary | ICD-10-CM | POA: Diagnosis not present

## 2018-05-07 DIAGNOSIS — D649 Anemia, unspecified: Secondary | ICD-10-CM | POA: Diagnosis not present

## 2018-05-07 DIAGNOSIS — Z9181 History of falling: Secondary | ICD-10-CM | POA: Diagnosis not present

## 2018-05-07 DIAGNOSIS — R141 Gas pain: Secondary | ICD-10-CM | POA: Diagnosis not present

## 2018-05-07 DIAGNOSIS — S72001A Fracture of unspecified part of neck of right femur, initial encounter for closed fracture: Secondary | ICD-10-CM | POA: Diagnosis not present

## 2018-05-07 DIAGNOSIS — Z7401 Bed confinement status: Secondary | ICD-10-CM | POA: Diagnosis not present

## 2018-05-07 DIAGNOSIS — M2550 Pain in unspecified joint: Secondary | ICD-10-CM | POA: Diagnosis not present

## 2018-05-07 DIAGNOSIS — N183 Chronic kidney disease, stage 3 (moderate): Secondary | ICD-10-CM | POA: Diagnosis not present

## 2018-05-07 DIAGNOSIS — W19XXXD Unspecified fall, subsequent encounter: Secondary | ICD-10-CM | POA: Diagnosis not present

## 2018-05-07 MED ORDER — AMLODIPINE BESYLATE 5 MG PO TABS: 5.0000 mg | ORAL_TABLET | Freq: Every day | ORAL | Status: DC

## 2018-05-07 MED ORDER — POLYETHYLENE GLYCOL 3350 17 G PO PACK: 17.0000 g | PACK | Freq: Every day | ORAL | 0 refills | Status: DC | PRN

## 2018-05-07 NOTE — Discharge Summary (Signed)
Physician Discharge Summary   Patient ID: Hannah Arias MRN: [id number] DOB/AGE: Aug 24, 1910 85 y.o.  Admit date: 04/30/2018 Discharge date: 05/07/2018  Primary Care Physician:  Celene Squibb, MD   Recommendations for Outpatient Follow-up:  1. Please follow-up with Dr. Lorin Mercy in 2 weeks 2. Continue PT, weightbearing as tolerated 3. Avoid sedatives or narcotics 4. Recommend palliative care to follow at the skilled nursing facility rehab 5. Dressing change daily to the left hip wound  Home Health: Patient being discharged to rehab Equipment/Devices:   Discharge Condition: stable  CODE STATUS:  DNR   Diet recommendation: Heart healthy diet   Discharge Diagnoses:    . Left hip fracture (HCC) Mild acute kidney injury on CKD stage III Acute blood loss anemia on chronic anemia Chronic thrombocytopenia Constipation Right paratracheal mass, atrophic uterus with calcified fibroid Chronic diastolic CHF Essential hypertension GERD Acute respiratory failure with hypoxia  Consults: Orthopedic    Allergies:   Allergies  Allergen Reactions  . Aspirin     Stomach problem  . Codeine     hallucinate  . Penicillins Rash    Has patient had a PCN reaction causing immediate rash, facial/tongue/throat swelling, SOB or lightheadedness with hypotension: Yes Has patient had a PCN reaction causing severe rash involving mucus membranes or skin necrosis: No Has patient had a PCN reaction that required hospitalization No Has patient had a PCN reaction occurring within the last 10 years: No If all of the above answers are "NO", then may proceed with Cephalosporin use.      DISCHARGE MEDICATIONS: Allergies as of 05/07/2018      Reactions   Aspirin    Stomach problem   Codeine    hallucinate   Penicillins Rash   Has patient had a PCN reaction causing immediate rash, facial/tongue/throat swelling, SOB or lightheadedness with hypotension: Yes Has patient had a PCN reaction causing  severe rash involving mucus membranes or skin necrosis: No Has patient had a PCN reaction that required hospitalization No Has patient had a PCN reaction occurring within the last 10 years: No If all of the above answers are "NO", then may proceed with Cephalosporin use.      Medication List    TAKE these medications   acetaminophen 500 MG tablet Commonly known as:  TYLENOL Take 1,000 mg by mouth every 6 (six) hours as needed for moderate pain.   albuterol (2.5 MG/3ML) 0.083% nebulizer solution Commonly known as:  PROVENTIL Take 3 mLs (2.5 mg total) by nebulization every 4 (four) hours as needed for wheezing or shortness of breath.   amLODipine 5 MG tablet Commonly known as:  NORVASC Take 1 tablet (5 mg total) by mouth daily. Start taking on:  May 08, 2018   omeprazole 20 MG capsule Commonly known as:  PRILOSEC Take 20 mg by mouth daily.   polyethylene glycol packet Commonly known as:  MIRALAX / GLYCOLAX Take 17 g by mouth daily as needed for mild constipation or moderate constipation.   senna-docusate 8.6-50 MG tablet Commonly known as:  Senokot-S Take 1 tablet by mouth 2 (two) times daily.        Brief H and P: For complete details please refer to admission H and P, but in brief Hannah Arias a 85 y.o.femalewho lives alone with medical history significant ofchronic diastolic congestive heart failure, GERD presenting to the hospital via EMS after falling at home and experiencing left hip and knee pain. Nursing staff noted dried feces at the bottom of  her feet. Found to have a left acute comminuted intertrochanteric fracture, mild AKI, CT showing large volume stool impacted within the rectal vault. She underwent left intertrochanteric nail placement on 1/11.   Hospital Course:   Left hip fracture ([place]) -After mechanical fall, status post left anterior trochanteric nail placement on 1/11 -Continue Tylenol for pain control, avoid sedatives or  narcotics Discussed with Dr. Lorin Mercy, wound on the left hip inspected by Dr. Lorin Mercy and recommended daily dressing changes otherwise no acute issues. Follow-up with Dr. Lorin Mercy in 2 week, continue PT OT, weightbearing as tolerated Patient had been living alone prior to this admission, family is realistic of the current situation.  If patient is deteriorating, they prefer her just to be comfortable at that point.  Hence recommend palliative care consult at the facility.    AKI (acute kidney injury) ([place]) on CKD stage III -Creatinine now improving, 1.0  Patient received gentle hydration, creatinine has improved to 1.0  Questionable UTI UA was positive however urine culture showed multiple bacterial morphytes, none predominant.  Not consistent with acute urinary tract infection Patient had received 2 days of IV Rocephin, does not need any further antibiotics.   Dyspnea and hypoxia on 1/13, acute respiratory failure with hypoxia -Chest x-ray showed no infiltrates, CT angiogram showed no PE, no pleural effusion, mild right posterior basilar subsegmental atelectasis, no pneumothorax -Currently on O2 2 L  Acute blood loss anemia on chronic anemia -Baseline hemoglobin 10-11, dropped to 6.6 on 1/13, status post 2 units packed RBCs -H&H stable, 9.5  Chronic thrombocytopenia -Platelets stable, improving, 153 at the time of discharge  Constipation Patient started on Colace, MiraLAX daily  Right paratracheal mass, atrophic uterus with calcified fibroid -Noted incidentally on the imaging  Chronic diastolic CHF Grade 1 per echo in 2015, not on diuretics at home  Hypertension -Continue Norvasc 5 mg daily    Day of Discharge S: Overnight was somewhat restless and fluid seeping from the left hip wound otherwise no complaints  BP 126/61 (BP Location: Left Arm)   Pulse 64   Temp (!) 97.5 F (36.4 C) (Oral)   Resp 14   SpO2 100%   Physical Exam: General: Alert and awake oriented,  NAD HEENT: anicteric sclera, pupils reactive to light and accommodation CVS: S1-S2 clear no murmur rubs or gallops Chest: clear to auscultation bilaterally, no wheezing rales or rhonchi Abdomen: soft nontender, nondistended, normal bowel sounds Extremities: no cyanosis, clubbing or edema noted bilaterally Neuro: No new deficit   The results of significant diagnostics from this hospitalization (including imaging, microbiology, ancillary and laboratory) are listed below for reference.      Procedures/Studies:  Dg Tibia/fibula Left  Result Date: 04/30/2018 CLINICAL DATA:  Fall EXAM: LEFT TIBIA AND FIBULA - 2 VIEW COMPARISON:  None. FINDINGS: Joint space calcifications at the knee. No acute displaced fracture or malalignment. Diffuse soft tissue swelling. Vascular calcifications. IMPRESSION: No acute osseous abnormality.  Chondrocalcinosis at the knee Electronically Signed   By: Donavan Foil M.D.   On: 04/30/2018 20:50   Dg Ankle Complete Left  Result Date: 04/30/2018 CLINICAL DATA:  Fall with left-sided leg pain EXAM: LEFT ANKLE COMPLETE - 3+ VIEW COMPARISON:  None. FINDINGS: Diffuse soft tissue edema. Bones appear osteopenic. No fracture or malalignment. Moderate plantar calcaneal spur. IMPRESSION: No acute osseous abnormality. Electronically Signed   By: Donavan Foil M.D.   On: 04/30/2018 20:51   Ct Head Wo Contrast  Result Date: 04/30/2018 CLINICAL DATA:  Slipped and fell EXAM:  CT HEAD WITHOUT CONTRAST CT CERVICAL SPINE WITHOUT CONTRAST TECHNIQUE: Multidetector CT imaging of the head and cervical spine was performed following the standard protocol without intravenous contrast. Multiplanar CT image reconstructions of the cervical spine were also generated. COMPARISON:  None. FINDINGS: CT HEAD FINDINGS Brain: Diffuse parenchymal atrophy. Patchy areas of hypoattenuation in deep and periventricular white matter bilaterally. Negative for acute intracranial hemorrhage, mass lesion, acute  infarction, midline shift, or mass-effect. Acute infarct may be inapparent on noncontrast CT. Ventricles and sulci symmetric. Bilateral basal ganglia mineralization. Vascular: Atherosclerotic and physiologic intracranial calcifications. Skull: Normal. Negative for fracture or focal lesion. Sinuses/Orbits: Mucoperiosteal thickening in the left maxillary sinus. Missing dentition. Other: Left frontal scalp soft tissue swelling. CT CERVICAL SPINE FINDINGS Alignment: Mild cervical dextroscoliosis. No significant spondylolisthesis, with some degradation of reconstructions due to motion. Skull base and vertebrae: Negative for fracture or focal bone lesion. Soft tissues and spinal canal: No visible canal hematoma. No prevertebral fluid or gas. Disc levels: Asymmetric facet DJD C2-T1, left worse than right. Narrowing of interspaces C4-C7 with small endplate spurs. Upper chest: Negative. Other: Bilateral calcified carotid bifurcation plaque. IMPRESSION: 1. Negative for bleed or other acute intracranial process. 2. Atrophy and nonspecific white matter changes. 3. Negative for cervical fracture or other acute bone abnormality. 4. Multilevel cervical degenerative changes as above. Electronically Signed   By: Lucrezia Europe M.D.   On: 04/30/2018 21:29   Ct Chest W Contrast  Result Date: 05/03/2018 CLINICAL DATA:  Shortness of breath. EXAM: CT CHEST WITH CONTRAST TECHNIQUE: Multidetector CT imaging of the chest was performed during intravenous contrast administration. CONTRAST:  170mL OMNIPAQUE IOHEXOL 300 MG/ML  SOLN COMPARISON:  Radiograph of same day. FINDINGS: Cardiovascular: Atherosclerosis of thoracic aorta is noted without aneurysm or dissection. Normal cardiac size. No pericardial effusion. Mediastinum/Nodes: 2.2 cm right thyroid nodule is noted. No adenopathy is noted. Esophagus is unremarkable. Lungs/Pleura: No pneumothorax is noted. Mild right posterior basilar subsegmental atelectasis is noted. No significant pleural  effusion is noted. Upper Abdomen: No acute abnormality. Musculoskeletal: No chest wall abnormality. No acute or significant osseous findings. IMPRESSION: 2.2 cm right thyroid nodule is noted. Thyroid ultrasound is recommended for further evaluation. Minimal right posterior basilar subsegmental atelectasis. Aortic Atherosclerosis (ICD10-I70.0). Electronically Signed   By: Marijo Conception, M.D.   On: 05/03/2018 15:24   Ct Cervical Spine Wo Contrast  Result Date: 04/30/2018 CLINICAL DATA:  Slipped and fell EXAM: CT HEAD WITHOUT CONTRAST CT CERVICAL SPINE WITHOUT CONTRAST TECHNIQUE: Multidetector CT imaging of the head and cervical spine was performed following the standard protocol without intravenous contrast. Multiplanar CT image reconstructions of the cervical spine were also generated. COMPARISON:  None. FINDINGS: CT HEAD FINDINGS Brain: Diffuse parenchymal atrophy. Patchy areas of hypoattenuation in deep and periventricular white matter bilaterally. Negative for acute intracranial hemorrhage, mass lesion, acute infarction, midline shift, or mass-effect. Acute infarct may be inapparent on noncontrast CT. Ventricles and sulci symmetric. Bilateral basal ganglia mineralization. Vascular: Atherosclerotic and physiologic intracranial calcifications. Skull: Normal. Negative for fracture or focal lesion. Sinuses/Orbits: Mucoperiosteal thickening in the left maxillary sinus. Missing dentition. Other: Left frontal scalp soft tissue swelling. CT CERVICAL SPINE FINDINGS Alignment: Mild cervical dextroscoliosis. No significant spondylolisthesis, with some degradation of reconstructions due to motion. Skull base and vertebrae: Negative for fracture or focal bone lesion. Soft tissues and spinal canal: No visible canal hematoma. No prevertebral fluid or gas. Disc levels: Asymmetric facet DJD C2-T1, left worse than right. Narrowing of interspaces C4-C7 with small endplate spurs. Upper  chest: Negative. Other: Bilateral  calcified carotid bifurcation plaque. IMPRESSION: 1. Negative for bleed or other acute intracranial process. 2. Atrophy and nonspecific white matter changes. 3. Negative for cervical fracture or other acute bone abnormality. 4. Multilevel cervical degenerative changes as above. Electronically Signed   By: Lucrezia Europe M.D.   On: 04/30/2018 21:29   Ct Abdomen Pelvis W Contrast  Result Date: 04/30/2018 CLINICAL DATA:  Slip and fall injury. EXAM: CT ABDOMEN AND PELVIS WITH CONTRAST TECHNIQUE: Multidetector CT imaging of the abdomen and pelvis was performed using the standard protocol following bolus administration of intravenous contrast. CONTRAST:  187mL ISOVUE-370 IOPAMIDOL (ISOVUE-370) INJECTION 76% COMPARISON:  None. FINDINGS: Lower chest: Motion artifact limits evaluation. Linear atelectasis in the lung bases. Hepatobiliary: Motion artifact limits evaluation. Several stones in the gallbladder. No definite gallbladder wall thickening or infiltration. No bile duct dilatation. Benign-appearing cyst in the left lobe of the liver measuring about 3.7 cm diameter. Pancreas: Atrophic appearance of the pancreas. No focal lesions identified. Spleen: Normal in size without focal abnormality. Adrenals/Urinary Tract: Adrenal glands are unremarkable. Kidneys are normal, without renal calculi, focal lesion, or hydronephrosis. Bladder is unremarkable. Stomach/Bowel: Stomach, small bowel, and colon are not abnormally distended. Stool throughout the colon. Prominent stool in the rectum. Right lower quadrant abdominal wall hernia consistent with spigelian hernia containing a portion of the right colon but without proximal obstruction. Vascular/Lymphatic: Tortuous and calcified aorta without aneurysmal dilatation. Inferior vena cava is unremarkable. No significant lymphadenopathy. Reproductive: Somewhat atrophic uterus with calcified fibroids. Suggestion of fluid in the endometrium which would be abnormal in a patient of this age.  Consider gynecology examination or follow-up pelvic ultrasound for further evaluation. Other: No free air or free fluid in the abdomen. Musculoskeletal: Degenerative changes throughout the lumbar spine. Comminuted inter trochanteric fractures of the left hip with varus angulation appear acute. No dislocation. Old appearing fracture deformities of the left superior and inferior pubic rami. IMPRESSION: 1. Acute comminuted inter trochanteric fractures of the left hip with varus angulation. No dislocation. 2. Cholelithiasis without evidence of cholecystitis. 3. Right lower quadrant abdominal wall hernia containing a portion of the right colon but without proximal obstruction. This could represent acute or chronic finding. 4. Somewhat atrophic uterus with calcified fibroids. Suggestion of fluid in the endometrium which would be abnormal in a patient of this age. Consider gynecology examination or follow-up pelvic ultrasound for further evaluation. Aortic Atherosclerosis (ICD10-I70.0). Electronically Signed   By: Lucienne Capers M.D.   On: 04/30/2018 21:40   Dg Pelvis Portable  Result Date: 04/30/2018 CLINICAL DATA:  Fall EXAM: PORTABLE PELVIS 1-2 VIEWS COMPARISON:  None. FINDINGS: Acute slightly comminuted fracture involving the left trochanter with cephalad migration of the distal femur. The left femoral head projects in joint. There may be additional left superior pubic ramus fracture. Moderate to marked arthritis of the right hip. IMPRESSION: 1. Acute displaced left trochanteric fracture 2. Possible acute left superior pubic ramus fracture Electronically Signed   By: Donavan Foil M.D.   On: 04/30/2018 20:49   Ct L-spine No Charge  Result Date: 04/30/2018 CLINICAL DATA:  Initial evaluation for acute trauma, fall. EXAM: CT LUMBAR SPINE WITHOUT CONTRAST TECHNIQUE: Multidetector CT imaging of the lumbar spine was performed without intravenous contrast administration. Multiplanar CT image reconstructions were  also generated. COMPARISON:  Concomitant CT of the abdomen and pelvis performed at the same time. Comparison also made with prior CT from 06/21/2015. FINDINGS: Segmentation: Transitional lumbosacral anatomy. For the purposes of this dictation the  lowest rib-bearing vertebra will be labeled T12. L5 vertebral body sacralized with rudimentary L5-S1 joint. Alignment: Levoscoliosis with apex at L2-3. Trace chronic anterolisthesis of L4 on L5, stable. Vertebrae: Vertebral body heights maintained without evidence for acute or chronic fracture. Small chronic endplate Schmorl's nodes present at the superior endplates of L2 and L3. Visualized sacrum and pelvis intact. SI joints approximated and symmetric. No discrete osseous lesions. Diffuse osteopenia. Paraspinal and other soft tissues: Paraspinous soft tissues demonstrate no acute finding. Chronic fatty atrophy noted within the posterior paraspinous musculature. Large volume stool seen impacted within the rectal vault. Extensive aorto bi-iliac atherosclerotic disease. Disc levels: T12-L1: Chronic intervertebral disc space narrowing with endplate osteophytic spurring. Mild facet hypertrophy. No significant stenosis. L1-2: Chronic intervertebral disc space narrowing with mild diffuse disc bulge and disc desiccation. Mild bilateral facet hypertrophy. No significant spinal stenosis. Moderate to severe right with mild left L1 foraminal stenosis. L2-3: Mild diffuse disc bulge with intervertebral disc space narrowing. Endplate osteophytic spurring anteriorly. Mild bilateral facet hypertrophy. No significant spinal stenosis. Foramina are patent. L3-4: Chronic intervertebral disc space narrowing with bridging syndesmophytes. Moderate facet arthrosis. Mild spinal stenosis with bilateral L3 foraminal narrowing. L4-5: Chronic trace anterolisthesis. Partial ankylosis with bridging syndesmophyte aches per ring. Advanced facet arthrosis. No significant spinal stenosis. Moderate bilateral  L4 foraminal narrowing, left greater than right. L5-S1: Transitional lumbosacral anatomy with sacralization of the L5 vertebral body and rudimentary L5-S1 disc. No significant stenosis. IMPRESSION: 1. No acute traumatic injury within the lumbar spine. 2. Levoscoliosis with associated mild to moderate multilevel degenerative spondylolysis and facet arthrosis as above, similar to previous. 3. Large volume stool impacted within the rectal vault. 4. Aortic atherosclerosis. Electronically Signed   By: Jeannine Boga M.D.   On: 04/30/2018 22:26   Dg Chest Port 1 View  Result Date: 05/03/2018 CLINICAL DATA:  85 year old female with hypoxia. Subsequent encounter. EXAM: PORTABLE CHEST 1 VIEW COMPARISON:  04/30/2008 and 04/22/2017 chest x-ray. FINDINGS: Cardiomegaly. Calcified tortuous aorta. Central pulmonary vascular prominence without pulmonary edema. Right paratracheal fullness unchanged. No segmental consolidation or pneumothorax. Bilateral shoulder joint degenerative changes. IMPRESSION: 1. No segmental consolidation or pulmonary edema. 2.  Aortic Atherosclerosis (ICD10-I70.0). Calcified tortuous aorta. 3. Prominence right paratracheal region unchanged and may be secondary to vasculature or substernal extension of enlarged thyroid gland. 4. Cardiomegaly. Electronically Signed   By: Genia Del M.D.   On: 05/03/2018 09:38   Dg Chest Port 1 View  Result Date: 04/30/2018 CLINICAL DATA:  Fall EXAM: PORTABLE CHEST 1 VIEW COMPARISON:  04/22/2017, CT cervical spine 06/21/2015, radiograph 05/13/2013 FINDINGS: No acute airspace disease or pleural effusion. Mild cardiomegaly with aortic atherosclerosis. No pneumothorax. Left upper chronic rib fractures. Prominent right paratracheal opacity, likely corresponding to CT demonstrated thyroid masses IMPRESSION: 1. Mild cardiomegaly without acute opacity 2. Similar appearance of right paratracheal mass, presumably corresponding to thyroid mass on previous CT.  Electronically Signed   By: Donavan Foil M.D.   On: 04/30/2018 20:45   Dg Knee Complete 4 Views Left  Result Date: 04/30/2018 CLINICAL DATA:  Fall EXAM: LEFT KNEE - COMPLETE 4+ VIEW COMPARISON:  None. FINDINGS: Bones appear osteopenic. Joint space calcifications. No fracture or malalignment. Moderate degenerative changes of the medial and lateral joint space with mild patellofemoral degenerative change. No knee effusion. Vascular calcification IMPRESSION: Degenerative changes without acute osseous abnormality Electronically Signed   By: Donavan Foil M.D.   On: 04/30/2018 20:47   Dg C-arm 1-60 Min  Result Date: 05/01/2018 CLINICAL DATA:  ORIF  of an intertrochanteric LEFT femoral neck fracture. EXAM: Operative HIP (WITH OR WITHOUT PELVIS) 2-3V LEFT COMPARISON:  04/30/2018. FINDINGS: 3 spot images from the C-arm fluoroscopic device, AP and LATERAL views of the LEFT hip are submitted for interpretation post-operatively. ORIF of the intertrochanteric femoral neck fracture with intramedullary nail and compression screw placement. Near anatomic alignment. The radiologic technologist documented 39 seconds of fluoroscopy time period IMPRESSION: Near anatomic alignment post ORIF of the intertrochanteric LEFT femoral neck fracture. Electronically Signed   By: Evangeline Dakin M.D.   On: 05/01/2018 15:07   Dg Hip Unilat With Pelvis 2-3 Views Left  Result Date: 05/01/2018 CLINICAL DATA:  ORIF of an intertrochanteric LEFT femoral neck fracture. EXAM: Operative HIP (WITH OR WITHOUT PELVIS) 2-3V LEFT COMPARISON:  04/30/2018. FINDINGS: 3 spot images from the C-arm fluoroscopic device, AP and LATERAL views of the LEFT hip are submitted for interpretation post-operatively. ORIF of the intertrochanteric femoral neck fracture with intramedullary nail and compression screw placement. Near anatomic alignment. The radiologic technologist documented 39 seconds of fluoroscopy time period IMPRESSION: Near anatomic alignment post  ORIF of the intertrochanteric LEFT femoral neck fracture. Electronically Signed   By: Evangeline Dakin M.D.   On: 05/01/2018 15:07   Dg Femur Portable Min 2 Views Left  Result Date: 04/30/2018 CLINICAL DATA:  Fall EXAM: LEFT FEMUR PORTABLE 2 VIEWS COMPARISON:  None. FINDINGS: Acute slightly comminuted and displaced left intertrochanteric fracture. Mid to distal femur appear intact. Vascular calcifications. IMPRESSION: Acute slightly comminuted and displaced left intertrochanteric fracture Electronically Signed   By: Donavan Foil M.D.   On: 04/30/2018 20:46      LAB RESULTS: Basic Metabolic Panel: Recent Labs  Lab 05/04/18 0422 05/06/18 0935  NA 138 142  K 4.3 4.5  CL 104 108  CO2 24 26  GLUCOSE 135* 109*  BUN 41* 52*  CREATININE 1.12* 1.01*  CALCIUM 8.2* 8.4*   Liver Function Tests: Recent Labs  Lab 04/30/18 2003  AST 17  ALT 10  ALKPHOS 51  BILITOT 0.7  PROT 5.9*  ALBUMIN 3.4*   No results for input(s): LIPASE, AMYLASE in the last 168 hours. No results for input(s): AMMONIA in the last 168 hours. CBC: Recent Labs  Lab 05/03/18 0219  05/04/18 0422 05/06/18 0935  WBC 10.4   < > 12.0* 9.7  NEUTROABS 7.7  --   --   --   HGB 6.6*   < > 9.6* 9.5*  HCT 21.2*   < > 30.3* 30.5*  MCV 101.4*   < > 96.2 98.4  PLT 99*   < > 125* 153   < > = values in this interval not displayed.   Cardiac Enzymes: Recent Labs  Lab 04/30/18 2338  TROPONINI <0.03   BNP: Invalid input(s): POCBNP CBG: No results for input(s): GLUCAP in the last 168 hours.    Disposition and Follow-up: Discharge Instructions    Diet - low sodium heart healthy   Complete by:  As directed    Increase activity slowly   Complete by:  As directed        DISPOSITION skilled nursing facility  [place] information for follow-up providers    [name], MD Follow up in 2 week(s).   Specialty:  Orthopedic Surgery Contact information: [place]  [place] [address] [phone]            Contact information for after-discharge care    [facility] .   Service:  Skilled Nursing Contact information: [address] [place] [phone]                   Time coordinating discharge:  40 minutes  Signed:   Estill Cotta M.D. [facility] 05/07/2018, 11:47 AM Pager: [phone]

## 2018-05-07 NOTE — Progress Notes (Signed)
Location:    [facility] Room Number: 102/P Place of Service:  SNF [phone]) Provider: Granville Lewis PA-C  Celene Squibb, MD  Patient Care Team: Celene Squibb, MD as PCP - General (Internal Medicine)  Extended Emergency Contact Information Primary Emergency Contact: Shammas,EllEs Address: [address]          [place], [place] [address] [place] Phone: [phone] Mobile Phone: [phone] Relation: Son Secondary Emergency Contact: Sheria, Rosello Mobile Phone: [phone] Relation: Grandson  Code Status:  DNR Goals of care: Advanced Directive information Advanced Directives 05/07/2018  Does Patient Have a Medical Advance Directive? Yes  Type of Advance Directive Out of facility DNR (pink MOST or yellow form)  Does patient want to make changes to medical advance directive? No - Patient declined  Copy of [place] in Chart? No - copy requested  Would patient like information on creating a medical advance directive? No - Patient declined  Pre-existing out of facility DNR order (yellow form or pink MOST form) -     Chief Complaint  Patient presents with  . Hospitalization Follow-up    Hospitalization F/U Visit   Status post hospitalization for left hip fracture status post repair with acute blood loss anemia HPI:  Pt is a 85 y.o. female seen today for a hospital f/u after apparently falling at home and experiencing acute left hip and knee pain- she was found to have an acute comminuted intra-trochanteric fracture she also had mild acute kidney injury and appeared to have constipation per CT scan.  She underwent surgical repair- nail placement on January 11-postop she did have anemia appears her hemoglobin dropped to the mid sixes however status post transfusion has gone up into the mid nines appears to be showing stability  She also has a history of diastolic CHF hypertension and GERD.  In regards acute kidney  injury her creatinine did improve it was 1.01 upon discharge status post gentle hydration.  She also had a questionable UTI urine culture showed multiple bacterial more fights none predominant.  She did get IV Rocephin in the hospital x2 days with recommendation of not needing further antibiotics.  She also complained of shortness of breath and had hypoxia-chest x-ray showed no acute process CT showed no pulmonary embolism or normal pleural effusion mild right posterior basilar segmental atelectasis-she is currently on oxygen and has orders for nebulizers.  Regards constipation she was started on Colace and MiraLAX--.  Regards to CHF she was not on a diuretic at home this will have to be monitored.  She also had an incidental finding of a right paratracheal mass atrophic uterus with calcified fibroid noted incidentally on imaging-.  She also has a history ofchronic t\hrmbocytopenia but this improved on discharge it was 153,000  Currently vital signs are stable she is on Norvasc for hypertension blood pressure is 111/59-.  She is on Tylenol for pain she is not really complaining of pain at this time  She does have follow-up with orthopedics.  And will need PT and OT as tolerated.  Patient had been living alone prior to admission and family apparently is realistic of her current situation if patient does not do well.--they prefer her just to be comfortable hence there is recommendation for palliative care   Past Medical History:  Diagnosis Date  . GERD (gastroesophageal reflux disease)    Past Surgical History:  Procedure Laterality Date  . APPENDECTOMY     85 yr  old  . BREAST LUMPECTOMY    . CATARACT EXTRACTION    . ELBOW SURGERY    . HEMORROIDECTOMY    . HERNIA REPAIR    . INTRAMEDULLARY (IM) NAIL INTERTROCHANTERIC Left 05/01/2018   Procedure: LEFT BIOMET AFFIXUS (IM) SHORT NAIL, INTERTROCHANTRIC FRACTURE;  Surgeon: [name], MD;  Location: [facility];  Service: Orthopedics;   Laterality: Left;  . SHOULDER SURGERY     left rotator cuff  . TONSILLECTOMY AND ADENOIDECTOMY     9 yr  . tubal removal      Allergies  Allergen Reactions  . Aspirin     Stomach problem  . Codeine     hallucinate  . Penicillins Rash    Has patient had a PCN reaction causing immediate rash, facial/tongue/throat swelling, SOB or lightheadedness with hypotension: Yes Has patient had a PCN reaction causing severe rash involving mucus membranes or skin necrosis: No Has patient had a PCN reaction that required hospitalization No Has patient had a PCN reaction occurring within the last 10 years: No If all of the above answers are "NO", then may proceed with Cephalosporin use.     Allergies as of 05/07/2018      Reactions   Aspirin    Stomach problem   Codeine    hallucinate   Penicillins Rash   Has patient had a PCN reaction causing immediate rash, facial/tongue/throat swelling, SOB or lightheadedness with hypotension: Yes Has patient had a PCN reaction causing severe rash involving mucus membranes or skin necrosis: No Has patient had a PCN reaction that required hospitalization No Has patient had a PCN reaction occurring within the last 10 years: No If all of the above answers are "NO", then may proceed with Cephalosporin use.      Medication List       Accurate as of May 07, 2018  3:43 PM. Always use your most recent med list.        acetaminophen 500 MG tablet Commonly known as:  TYLENOL Take 1,000 mg by mouth every 6 (six) hours as needed for moderate pain.   albuterol (2.5 MG/3ML) 0.083% nebulizer solution Commonly known as:  PROVENTIL Take 3 mLs (2.5 mg total) by nebulization every 4 (four) hours as needed for wheezing or shortness of breath.   amLODipine 5 MG tablet Commonly known as:  NORVASC Take 1 tablet (5 mg total) by mouth daily. Start taking on:  May 08, 2018   omeprazole 20 MG capsule Commonly known as:  PRILOSEC Take 20 mg by mouth daily.     polyethylene glycol packet Commonly known as:  MIRALAX / GLYCOLAX Take 17 g by mouth daily as needed for mild constipation or moderate constipation.   senna-docusate 8.6-50 MG tablet Commonly known as:  Senokot-S Take 1 tablet by mouth 2 (two) times daily.       Review of Systems   This is somewhat limited secondary to patient being very hard of hearing.  In general she is not complain of fever chills says she does feel weak.  Skin does not complain of itching or rashes surgical site is currently covered on the left hip.  Head ears eyes nose mouth and throat not complain of visual changes or sore throat.  Respiratory is not complaining of shortness of breath or cough apparently this was an issue in the hospital.  Cardiac is not complaining of chest pain has 1-2+ lower extremity edema left leg surgical leg.  GI is not complaining of abdominal pain  nausea vomiting diarrhea or constipation.  GU is not complaining of dysuria.  Musculoskeletal at this point is not really complaining of joint pain pain.  Neurologic is not complaining of dizzy or syncopal is complaining of feeling weak tired.  Psych does not really appear to be complaining of being depressed appears a little bit anxious with her new surroundings--says she would prefer to be in her room with somebody.    Immunization History  Administered Date(s) Administered  . Tdap 12/10/2012   Pertinent  Health Maintenance Due  Topic Date Due  . INFLUENZA VACCINE  06/07/2018 (Originally 11/19/2017)  . PNA vac Low Risk Adult (1 of 2 - PCV13) 06/07/2018 (Originally [birth year]/1978)  . DEXA SCAN  Completed   No flowsheet data found. Functional Status Survey:    Temperature is 98.3 pulse 62 respirations 22 blood pressure 111/59--O2 saturations are in the 90s on oxygen  Physical Exam   In general this is a pleasant elderly female in no distress- lying comfortably in bed she is a little bit anxious.  Her skin is warm and dry  there is covering over the left hip surgical site I do not see any surrounding erythema or drainage apparent  from what I was able to assess   Eyes visual acuity appears to be intact sclera and conjunctive are clear she appears to have some slight proptosis of her left eye lid.  Oropharynx is clear mucous membranes appear somewhat dry.  Chest is clear to auscultation with shallow air entry.  Heart is regular rate and rhythm without murmur gallop or rub.  Abdomen is soft does not appear to be tender there are positive bowel sounds.  Musculoskeletal does have generalized weakness is able to move her upper extremities-has lower extremity weakness and has 1-2+ edema of her left leg-- pedal pulse is intact the erythema is cool to touch nonerythematous does not appear to be tender   Neurologic appears grossly intact it was a limited exam since she is in bed but cranial nerves appear to be intact her speech is clear cannot really appreciate lateralizing findings.  Psych she appears grossly alert and oriented this is somewhat hindered because she is very hard of hearing but she did follow simple verbal commands-and appeared to answer appropriately    Labs reviewed: Recent Labs    05/03/18 0219 05/04/18 0422 05/06/18 0935  NA 137 138 142  K 4.3 4.3 4.5  CL 104 104 108  CO2 27 24 26   GLUCOSE 136* 135* 109*  BUN 33* 41* 52*  CREATININE 1.16* 1.12* 1.01*  CALCIUM 8.0* 8.2* 8.4*   Recent Labs    04/30/18 2003  AST 17  ALT 10  ALKPHOS 51  BILITOT 0.7  PROT 5.9*  ALBUMIN 3.4*   Recent Labs    05/02/18 0448 05/03/18 0219 05/03/18 1814 05/04/18 0422 05/06/18 0935  WBC 8.3 10.4 12.8* 12.0* 9.7  NEUTROABS 5.7 7.7  --   --   --   HGB 7.1* 6.6* 9.6* 9.6* 9.5*  HCT 22.2* 21.2* 30.0* 30.3* 30.5*  MCV 101.4* 101.4* 96.2 96.2 98.4  PLT 98* 99* 107* 125* 153   No results found for: TSH No results found for: HGBA1C No results found for: CHOL, HDL, LDLCALC, LDLDIRECT, TRIG,  CHOLHDL  Significant Diagnostic Results in last 30 days:  Dg Tibia/fibula Left  Result Date: 04/30/2018 CLINICAL DATA:  Fall EXAM: LEFT TIBIA AND FIBULA - 2 VIEW COMPARISON:  None. FINDINGS: Joint space calcifications at the knee. No acute  displaced fracture or malalignment. Diffuse soft tissue swelling. Vascular calcifications. IMPRESSION: No acute osseous abnormality.  Chondrocalcinosis at the knee Electronically Signed   By: Donavan Foil M.D.   On: 04/30/2018 20:50   Dg Ankle Complete Left  Result Date: 04/30/2018 CLINICAL DATA:  Fall with left-sided leg pain EXAM: LEFT ANKLE COMPLETE - 3+ VIEW COMPARISON:  None. FINDINGS: Diffuse soft tissue edema. Bones appear osteopenic. No fracture or malalignment. Moderate plantar calcaneal spur. IMPRESSION: No acute osseous abnormality. Electronically Signed   By: Donavan Foil M.D.   On: 04/30/2018 20:51   Ct Head Wo Contrast  Result Date: 04/30/2018 CLINICAL DATA:  Slipped and fell EXAM: CT HEAD WITHOUT CONTRAST CT CERVICAL SPINE WITHOUT CONTRAST TECHNIQUE: Multidetector CT imaging of the head and cervical spine was performed following the standard protocol without intravenous contrast. Multiplanar CT image reconstructions of the cervical spine were also generated. COMPARISON:  None. FINDINGS: CT HEAD FINDINGS Brain: Diffuse parenchymal atrophy. Patchy areas of hypoattenuation in deep and periventricular white matter bilaterally. Negative for acute intracranial hemorrhage, mass lesion, acute infarction, midline shift, or mass-effect. Acute infarct may be inapparent on noncontrast CT. Ventricles and sulci symmetric. Bilateral basal ganglia mineralization. Vascular: Atherosclerotic and physiologic intracranial calcifications. Skull: Normal. Negative for fracture or focal lesion. Sinuses/Orbits: Mucoperiosteal thickening in the left maxillary sinus. Missing dentition. Other: Left frontal scalp soft tissue swelling. CT CERVICAL SPINE FINDINGS Alignment: Mild  cervical dextroscoliosis. No significant spondylolisthesis, with some degradation of reconstructions due to motion. Skull base and vertebrae: Negative for fracture or focal bone lesion. Soft tissues and spinal canal: No visible canal hematoma. No prevertebral fluid or gas. Disc levels: Asymmetric facet DJD C2-T1, left worse than right. Narrowing of interspaces C4-C7 with small endplate spurs. Upper chest: Negative. Other: Bilateral calcified carotid bifurcation plaque. IMPRESSION: 1. Negative for bleed or other acute intracranial process. 2. Atrophy and nonspecific white matter changes. 3. Negative for cervical fracture or other acute bone abnormality. 4. Multilevel cervical degenerative changes as above. Electronically Signed   By: Lucrezia Europe M.D.   On: 04/30/2018 21:29   Ct Cervical Spine Wo Contrast  Result Date: 04/30/2018 CLINICAL DATA:  Slipped and fell EXAM: CT HEAD WITHOUT CONTRAST CT CERVICAL SPINE WITHOUT CONTRAST TECHNIQUE: Multidetector CT imaging of the head and cervical spine was performed following the standard protocol without intravenous contrast. Multiplanar CT image reconstructions of the cervical spine were also generated. COMPARISON:  None. FINDINGS: CT HEAD FINDINGS Brain: Diffuse parenchymal atrophy. Patchy areas of hypoattenuation in deep and periventricular white matter bilaterally. Negative for acute intracranial hemorrhage, mass lesion, acute infarction, midline shift, or mass-effect. Acute infarct may be inapparent on noncontrast CT. Ventricles and sulci symmetric. Bilateral basal ganglia mineralization. Vascular: Atherosclerotic and physiologic intracranial calcifications. Skull: Normal. Negative for fracture or focal lesion. Sinuses/Orbits: Mucoperiosteal thickening in the left maxillary sinus. Missing dentition. Other: Left frontal scalp soft tissue swelling. CT CERVICAL SPINE FINDINGS Alignment: Mild cervical dextroscoliosis. No significant spondylolisthesis, with some degradation  of reconstructions due to motion. Skull base and vertebrae: Negative for fracture or focal bone lesion. Soft tissues and spinal canal: No visible canal hematoma. No prevertebral fluid or gas. Disc levels: Asymmetric facet DJD C2-T1, left worse than right. Narrowing of interspaces C4-C7 with small endplate spurs. Upper chest: Negative. Other: Bilateral calcified carotid bifurcation plaque. IMPRESSION: 1. Negative for bleed or other acute intracranial process. 2. Atrophy and nonspecific white matter changes. 3. Negative for cervical fracture or other acute bone abnormality. 4. Multilevel cervical degenerative changes as above.  Electronically Signed   By: Lucrezia Europe M.D.   On: 04/30/2018 21:29   Ct Abdomen Pelvis W Contrast  Result Date: 04/30/2018 CLINICAL DATA:  Slip and fall injury. EXAM: CT ABDOMEN AND PELVIS WITH CONTRAST TECHNIQUE: Multidetector CT imaging of the abdomen and pelvis was performed using the standard protocol following bolus administration of intravenous contrast. CONTRAST:  144mL ISOVUE-370 IOPAMIDOL (ISOVUE-370) INJECTION 76% COMPARISON:  None. FINDINGS: Lower chest: Motion artifact limits evaluation. Linear atelectasis in the lung bases. Hepatobiliary: Motion artifact limits evaluation. Several stones in the gallbladder. No definite gallbladder wall thickening or infiltration. No bile duct dilatation. Benign-appearing cyst in the left lobe of the liver measuring about 3.7 cm diameter. Pancreas: Atrophic appearance of the pancreas. No focal lesions identified. Spleen: Normal in size without focal abnormality. Adrenals/Urinary Tract: Adrenal glands are unremarkable. Kidneys are normal, without renal calculi, focal lesion, or hydronephrosis. Bladder is unremarkable. Stomach/Bowel: Stomach, small bowel, and colon are not abnormally distended. Stool throughout the colon. Prominent stool in the rectum. Right lower quadrant abdominal wall hernia consistent with spigelian hernia containing a portion  of the right colon but without proximal obstruction. Vascular/Lymphatic: Tortuous and calcified aorta without aneurysmal dilatation. Inferior vena cava is unremarkable. No significant lymphadenopathy. Reproductive: Somewhat atrophic uterus with calcified fibroids. Suggestion of fluid in the endometrium which would be abnormal in a patient of this age. Consider gynecology examination or follow-up pelvic ultrasound for further evaluation. Other: No free air or free fluid in the abdomen. Musculoskeletal: Degenerative changes throughout the lumbar spine. Comminuted inter trochanteric fractures of the left hip with varus angulation appear acute. No dislocation. Old appearing fracture deformities of the left superior and inferior pubic rami. IMPRESSION: 1. Acute comminuted inter trochanteric fractures of the left hip with varus angulation. No dislocation. 2. Cholelithiasis without evidence of cholecystitis. 3. Right lower quadrant abdominal wall hernia containing a portion of the right colon but without proximal obstruction. This could represent acute or chronic finding. 4. Somewhat atrophic uterus with calcified fibroids. Suggestion of fluid in the endometrium which would be abnormal in a patient of this age. Consider gynecology examination or follow-up pelvic ultrasound for further evaluation. Aortic Atherosclerosis (ICD10-I70.0). Electronically Signed   By: Lucienne Capers M.D.   On: 04/30/2018 21:40   Dg Pelvis Portable  Result Date: 04/30/2018 CLINICAL DATA:  Fall EXAM: PORTABLE PELVIS 1-2 VIEWS COMPARISON:  None. FINDINGS: Acute slightly comminuted fracture involving the left trochanter with cephalad migration of the distal femur. The left femoral head projects in joint. There may be additional left superior pubic ramus fracture. Moderate to marked arthritis of the right hip. IMPRESSION: 1. Acute displaced left trochanteric fracture 2. Possible acute left superior pubic ramus fracture Electronically Signed    By: Donavan Foil M.D.   On: 04/30/2018 20:49   Ct L-spine No Charge  Result Date: 04/30/2018 CLINICAL DATA:  Initial evaluation for acute trauma, fall. EXAM: CT LUMBAR SPINE WITHOUT CONTRAST TECHNIQUE: Multidetector CT imaging of the lumbar spine was performed without intravenous contrast administration. Multiplanar CT image reconstructions were also generated. COMPARISON:  Concomitant CT of the abdomen and pelvis performed at the same time. Comparison also made with prior CT from 06/21/2015. FINDINGS: Segmentation: Transitional lumbosacral anatomy. For the purposes of this dictation the lowest rib-bearing vertebra will be labeled T12. L5 vertebral body sacralized with rudimentary L5-S1 joint. Alignment: Levoscoliosis with apex at L2-3. Trace chronic anterolisthesis of L4 on L5, stable. Vertebrae: Vertebral body heights maintained without evidence for acute or chronic fracture. Small chronic  endplate Schmorl's nodes present at the superior endplates of L2 and L3. Visualized sacrum and pelvis intact. SI joints approximated and symmetric. No discrete osseous lesions. Diffuse osteopenia. Paraspinal and other soft tissues: Paraspinous soft tissues demonstrate no acute finding. Chronic fatty atrophy noted within the posterior paraspinous musculature. Large volume stool seen impacted within the rectal vault. Extensive aorto bi-iliac atherosclerotic disease. Disc levels: T12-L1: Chronic intervertebral disc space narrowing with endplate osteophytic spurring. Mild facet hypertrophy. No significant stenosis. L1-2: Chronic intervertebral disc space narrowing with mild diffuse disc bulge and disc desiccation. Mild bilateral facet hypertrophy. No significant spinal stenosis. Moderate to severe right with mild left L1 foraminal stenosis. L2-3: Mild diffuse disc bulge with intervertebral disc space narrowing. Endplate osteophytic spurring anteriorly. Mild bilateral facet hypertrophy. No significant spinal stenosis. Foramina  are patent. L3-4: Chronic intervertebral disc space narrowing with bridging syndesmophytes. Moderate facet arthrosis. Mild spinal stenosis with bilateral L3 foraminal narrowing. L4-5: Chronic trace anterolisthesis. Partial ankylosis with bridging syndesmophyte aches per ring. Advanced facet arthrosis. No significant spinal stenosis. Moderate bilateral L4 foraminal narrowing, left greater than right. L5-S1: Transitional lumbosacral anatomy with sacralization of the L5 vertebral body and rudimentary L5-S1 disc. No significant stenosis. IMPRESSION: 1. No acute traumatic injury within the lumbar spine. 2. Levoscoliosis with associated mild to moderate multilevel degenerative spondylolysis and facet arthrosis as above, similar to previous. 3. Large volume stool impacted within the rectal vault. 4. Aortic atherosclerosis. Electronically Signed   By: Jeannine Boga M.D.   On: 04/30/2018 22:26   Dg Chest Port 1 View  Result Date: 04/30/2018 CLINICAL DATA:  Fall EXAM: PORTABLE CHEST 1 VIEW COMPARISON:  04/22/2017, CT cervical spine 06/21/2015, radiograph 05/13/2013 FINDINGS: No acute airspace disease or pleural effusion. Mild cardiomegaly with aortic atherosclerosis. No pneumothorax. Left upper chronic rib fractures. Prominent right paratracheal opacity, likely corresponding to CT demonstrated thyroid masses IMPRESSION: 1. Mild cardiomegaly without acute opacity 2. Similar appearance of right paratracheal mass, presumably corresponding to thyroid mass on previous CT. Electronically Signed   By: Donavan Foil M.D.   On: 04/30/2018 20:45   Dg Knee Complete 4 Views Left  Result Date: 04/30/2018 CLINICAL DATA:  Fall EXAM: LEFT KNEE - COMPLETE 4+ VIEW COMPARISON:  None. FINDINGS: Bones appear osteopenic. Joint space calcifications. No fracture or malalignment. Moderate degenerative changes of the medial and lateral joint space with mild patellofemoral degenerative change. No knee effusion. Vascular calcification  IMPRESSION: Degenerative changes without acute osseous abnormality Electronically Signed   By: Donavan Foil M.D.   On: 04/30/2018 20:47   Dg C-arm 1-60 Min  Result Date: 05/01/2018 CLINICAL DATA:  ORIF of an intertrochanteric LEFT femoral neck fracture. EXAM: Operative HIP (WITH OR WITHOUT PELVIS) 2-3V LEFT COMPARISON:  04/30/2018. FINDINGS: 3 spot images from the C-arm fluoroscopic device, AP and LATERAL views of the LEFT hip are submitted for interpretation post-operatively. ORIF of the intertrochanteric femoral neck fracture with intramedullary nail and compression screw placement. Near anatomic alignment. The radiologic technologist documented 39 seconds of fluoroscopy time period IMPRESSION: Near anatomic alignment post ORIF of the intertrochanteric LEFT femoral neck fracture. Electronically Signed   By: Evangeline Dakin M.D.   On: 05/01/2018 15:07   Dg Hip Unilat With Pelvis 2-3 Views Left  Result Date: 05/01/2018 CLINICAL DATA:  ORIF of an intertrochanteric LEFT femoral neck fracture. EXAM: Operative HIP (WITH OR WITHOUT PELVIS) 2-3V LEFT COMPARISON:  04/30/2018. FINDINGS: 3 spot images from the C-arm fluoroscopic device, AP and LATERAL views of the LEFT hip are submitted for  interpretation post-operatively. ORIF of the intertrochanteric femoral neck fracture with intramedullary nail and compression screw placement. Near anatomic alignment. The radiologic technologist documented 39 seconds of fluoroscopy time period IMPRESSION: Near anatomic alignment post ORIF of the intertrochanteric LEFT femoral neck fracture. Electronically Signed   By: Evangeline Dakin M.D.   On: 05/01/2018 15:07   Dg Femur Portable Min 2 Views Left  Result Date: 04/30/2018 CLINICAL DATA:  Fall EXAM: LEFT FEMUR PORTABLE 2 VIEWS COMPARISON:  None. FINDINGS: Acute slightly comminuted and displaced left intertrochanteric fracture. Mid to distal femur appear intact. Vascular calcifications. IMPRESSION: Acute slightly comminuted  and displaced left intertrochanteric fracture Electronically Signed   By: Donavan Foil M.D.   On: 04/30/2018 20:46    Assessment/Plan  1 left hip fracture-status post fall she is status post surgical repair- she will have follow-up by orthopedics- PT OT and weightbearing as tolerated.  She is on Tylenol for pain apparently did not do well with narcotics in the hospital it made her confused.  She is not on anticoagulation I suspect secondary to her advanced age and fall risk as well as thrombocytopenia--it appears aspirin is listed as an allergy  secondary to stomach problems   #2- history of postop anemia status post transfusion she appears to be moving up hemoglobin of 9.5 has risen up from 6.6 before transfusion.  3.  History of acute kidney injury this appears to have improved with hydration with creatinine of 1.01 on lab done yesterday.  Fluids will have to be encouraged.  4.  History of dyspnea and hypoxia-again chest x-ray and CT scan were reassuring ruling out any acute process she is on oxygen-saturations are in the 90s-she is not really complaining of shortness of breath today but this will have to be watched.  She does have a order for albuterol every 4 hours as needed.  5 history of UTI?  Again culture did not really grow out any specific organisms-antibiotics were discontinued.  6.  History of constipation she is on MiraLAX as well as Senokot at this point will monitor.  7.  History of GERD she is on a PPI.  8.  Hypertension she is on Norvasc 5 mg a day blood pressure appears stable today at one  111/59-continue to monitor  #9 history of chronic thrombocytopenia apparently this has improved it was 153,000 yesterday.  10.  History of right paratracheal mass and atrophic uterus calcified fibroid-this was an incidental finding on imaging appears no aggressive work-up is desired.  11.  History of diastolic CHF at this point will monitor clinical status-this is grade 1  diastolic dysfunction per echo done in 2015- she has not been on diuretics apparently .  It appears  If  patient does not do well status post her surgery family does not want any aggressive measures--- it appears there desire is more for comfort care-and apparently palliative care has been recommended per hospital discharge summary   [id number]-of note greater than 40 minutes spent assessing patient-reviewing her chart and labs- coordinating and formulating a plan of care for numerous diagnoses- of note greater than 50% of time spent coordinating a plan of care with input as noted above

## 2018-05-07 NOTE — Plan of Care (Signed)
  Problem: Clinical Measurements: Goal: Postoperative complications will be avoided or minimized Outcome: Adequate for Discharge   Problem: Self-Concept: Goal: Ability to maintain and perform role responsibilities to the fullest extent possible will improve Outcome: Adequate for Discharge   Problem: Pain Management: Goal: Pain level will decrease Outcome: Adequate for Discharge   Problem: Education: Goal: Verbalization of understanding the information provided (i.e., activity precautions, restrictions, etc) will improve Outcome: Adequate for Discharge Goal: Individualized Educational Video(s) Outcome: Adequate for Discharge   Problem: Activity: Goal: Ability to ambulate and perform ADLs will improve Outcome: Adequate for Discharge   Problem: Clinical Measurements: Goal: Postoperative complications will be avoided or minimized Outcome: Adequate for Discharge   Problem: Self-Concept: Goal: Ability to maintain and perform role responsibilities to the fullest extent possible will improve Outcome: Adequate for Discharge   Problem: Pain Management: Goal: Pain level will decrease Outcome: Adequate for Discharge   Problem: Education: Goal: Knowledge of General Education information will improve Description Including pain rating scale, medication(s)/side effects and non-pharmacologic comfort measures Outcome: Adequate for Discharge   Problem: Health Behavior/Discharge Planning: Goal: Ability to manage health-related needs will improve Outcome: Adequate for Discharge   Problem: Clinical Measurements: Goal: Ability to maintain clinical measurements within normal limits will improve Outcome: Adequate for Discharge Goal: Will remain free from infection Outcome: Adequate for Discharge Goal: Diagnostic test results will improve Outcome: Adequate for Discharge Goal: Respiratory complications will improve Outcome: Adequate for Discharge Goal: Cardiovascular complication will be  avoided Outcome: Adequate for Discharge   Problem: Activity: Goal: Risk for activity intolerance will decrease Outcome: Adequate for Discharge   Problem: Nutrition: Goal: Adequate nutrition will be maintained Outcome: Adequate for Discharge   Problem: Coping: Goal: Level of anxiety will decrease Outcome: Adequate for Discharge   Problem: Elimination: Goal: Will not experience complications related to bowel motility Outcome: Adequate for Discharge Goal: Will not experience complications related to urinary retention Outcome: Adequate for Discharge   Problem: Pain Managment: Goal: General experience of comfort will improve Outcome: Adequate for Discharge   Problem: Safety: Goal: Ability to remain free from injury will improve Outcome: Adequate for Discharge   Problem: Skin Integrity: Goal: Risk for impaired skin integrity will decrease Outcome: Adequate for Discharge

## 2018-05-07 NOTE — Progress Notes (Signed)
Patient will DC to: [facility] Anticipated DC date: 05/07/2018 Family notified: Birder Robson Transport by: Corey Harold  Per MD patient ready for DC to [facility] . RN, patient, patient's family, and facility notified of DC. Discharge Summary sent to facility. RN given number for report [phone]. DC packet on chart. Ambulance transport requested for patient.  CSW signing off.  [place], [place]

## 2018-05-07 NOTE — Progress Notes (Signed)
.     Subjective: 6 Days Post-Op Procedure(s) (LRB): LEFT BIOMET AFFIXUS (IM) SHORT NAIL, INTERTROCHANTRIC FRACTURE (Left) Patient reports pain as mild.    Objective: Vital signs in last 24 hours: Temp:  [97.5 F (36.4 C)-98.4 F (36.9 C)] 97.5 F (36.4 C) (01/17 0337) Pulse Rate:  [64-84] 64 (01/17 0337) Resp:  [14-16] 14 (01/17 0337) BP: (118-136)/(53-67) 126/61 (01/17 0337) SpO2:  [90 %-100 %] 100 % (01/17 0337)  Intake/Output from previous day: 01/16 0701 - 01/17 0700 In: 120 [P.O.:120] Out: 500 [Urine:500] Intake/Output this shift: No intake/output data recorded.  Recent Labs    05/06/18 0935  HGB 9.5*   Recent Labs    05/06/18 0935  WBC 9.7  RBC 3.10*  HCT 30.5*  PLT 153   Recent Labs    05/06/18 0935  NA 142  K 4.5  CL 108  CO2 26  BUN 52*  CREATININE 1.01*  GLUCOSE 109*  CALCIUM 8.4*   No results for input(s): LABPT, INR in the last 72 hours.  Neurologically intact. Continues to have serous drainage.  No results found.  Assessment/Plan: 6 Days Post-Op Procedure(s) (LRB): LEFT BIOMET AFFIXUS (IM) SHORT NAIL, INTERTROCHANTRIC FRACTURE (Left) Discharge to SNF.   Betadine swab incision when dressing is changed. Office followup in 2 wks in [facility] 05/07/2018, 10:08 AM

## 2018-05-07 NOTE — Clinical Social Work Placement (Signed)
   CLINICAL SOCIAL WORK PLACEMENT  NOTE  Date:  05/07/2018  Patient Details  Name: Hannah Arias MRN: [id number] Date of Birth: 08/25/1910  Clinical Social Work is seeking post-discharge placement for this patient at the [place] level of care (*CSW will initial, date and re-position this form in  chart as items are completed):      Patient/family provided with [place] Work Department's list of facilities offering this level of care within the geographic area requested by the patient (or if unable, by the patient's family).  Yes   Patient/family informed of their freedom to choose among providers that offer the needed level of care, that participate in Medicare, Medicaid or managed care program needed by the patient, have an available bed and are willing to accept the patient.      Patient/family informed of [facility]'s ownership interest in [facility] and [facility], as well as of the fact that they are under no obligation to receive care at these facilities.  PASRR submitted to EDS on       PASRR number received on 05/02/18     Existing PASRR number confirmed on       FL2 transmitted to all facilities in geographic area requested by pt/family on 05/02/18     FL2 transmitted to all facilities within larger geographic area on       Patient informed that his/her managed care company has contracts with or will negotiate with certain facilities, including the following:        Yes   Patient/family informed of bed offers received.  Patient chooses bed at [facility]     Physician recommends and patient chooses bed at      Patient to be transferred to [facility] on 05/07/18.  Patient to be transferred to facility by [facility]     Patient family notified on 05/07/18 of transfer.  Name of family member notified:  son [facility]     PHYSICIAN       Additional Comment:     _______________________________________________ Alberteen Sam, LCSW 05/07/2018, 12:46 PM

## 2018-05-07 NOTE — Discharge Instructions (Signed)
Change dressing daily as needed, betadine swab incision then 4X4 , ABD and paper tape.   See Dr. Lorin Mercy in 2 wks on Thursday in [place] office. [phone] for appt time in [place] clinic.   Weight bearing as tolerated with therapy.

## 2018-05-09 ENCOUNTER — Encounter: Payer: Self-pay | Admitting: Internal Medicine

## 2018-05-10 ENCOUNTER — Non-Acute Institutional Stay (SKILLED_NURSING_FACILITY): Payer: Medicare HMO | Admitting: Internal Medicine

## 2018-05-10 ENCOUNTER — Encounter: Payer: Self-pay | Admitting: Internal Medicine

## 2018-05-10 ENCOUNTER — Other Ambulatory Visit: Payer: Self-pay

## 2018-05-10 DIAGNOSIS — D649 Anemia, unspecified: Secondary | ICD-10-CM

## 2018-05-10 DIAGNOSIS — I10 Essential (primary) hypertension: Secondary | ICD-10-CM | POA: Insufficient documentation

## 2018-05-10 DIAGNOSIS — S72002S Fracture of unspecified part of neck of left femur, sequela: Secondary | ICD-10-CM

## 2018-05-10 DIAGNOSIS — N183 Chronic kidney disease, stage 3 unspecified: Secondary | ICD-10-CM

## 2018-05-10 DIAGNOSIS — I5032 Chronic diastolic (congestive) heart failure: Secondary | ICD-10-CM | POA: Diagnosis not present

## 2018-05-10 DIAGNOSIS — R0902 Hypoxemia: Secondary | ICD-10-CM

## 2018-05-10 MED ORDER — HYDROCODONE-ACETAMINOPHEN 5-325 MG PO TABS: ORAL_TABLET | ORAL | 0 refills | Status: DC

## 2018-05-10 NOTE — Progress Notes (Signed)
Provider: Veleta Miners MD  Location:    [place] Room Number: 102/P Place of Service:  SNF (31)  PCP: Celene Squibb, MD Patient Care Team: Celene Squibb, MD as PCP - General (Internal Medicine)  Extended Emergency Contact Information Primary Emergency Contact: Touch,EllEs Address: [address]          [place], [place] [address] [place] Phone: [phone] Mobile Phone: [phone] Relation: Son Secondary Emergency Contact: Margeret, [place] Mobile Phone: [phone] Relation: Grandson  Code Status: DNR Goals of Care: Advanced Directive information Advanced Directives 05/10/2018  Does Patient Have a Medical Advance Directive? Yes  Type of Advance Directive Out of facility DNR (pink MOST or yellow form)  Does patient want to make changes to medical advance directive? No - Patient declined  Copy of [place] in Chart? No - copy requested  Would patient like information on creating a medical advance directive? No - Patient declined  Pre-existing out of facility DNR order (yellow form or pink MOST form) -      Chief Complaint  Patient presents with  . New Admit To SNF    New Admission Visit    HPI: Patient is a 85 y.o. female seen today for admission to SNF for therapy. Patient was admitted in the hospital From 01/10-01/17 For Left Hip Fracture requiring anterior trochanteric nail placement on 1/11 Patient has a history of hypertension, chronic diastolic CHF and GERD who presented to the hospital after she fell at home.  She was found to have left acute intertrochanteric fracture.  She went through surgery on 01/11 She was also found to have dyspnea and hypoxia.  CT did not show any PE.  She was discharged on 2 L of oxygen. Postop she also dropped her hemoglobin to 6.6 requiring 2 units of blood Patient was in the bed and her main complaint was pain in her left hip.  Discussed with the therapist and she told  the therapy that she does not want aggressive therapy.  She also states her pain is not controlled on Tylenol. Patient lives by herself but had her grandson who checks on her.  Was not driving.  Was responsible for her on finances ~1 month ago.  She walked with a walker at home  Past Medical History:  Diagnosis Date  . GERD (gastroesophageal reflux disease)    Past Surgical History:  Procedure Laterality Date  . APPENDECTOMY     85 yr old  . BREAST LUMPECTOMY    . CATARACT EXTRACTION    . ELBOW SURGERY    . HEMORROIDECTOMY    . HERNIA REPAIR    . INTRAMEDULLARY (IM) NAIL INTERTROCHANTERIC Left 05/01/2018   Procedure: LEFT BIOMET AFFIXUS (IM) SHORT NAIL, INTERTROCHANTRIC FRACTURE;  Surgeon: [name], MD;  Location: [facility];  Service: Orthopedics;  Laterality: Left;  . SHOULDER SURGERY     left rotator cuff  . TONSILLECTOMY AND ADENOIDECTOMY     11 yr  . tubal removal      reports that she has never smoked. She has never used smokeless tobacco. She reports that she does not drink alcohol. No history on file for drug. Social History   Socioeconomic History  . Marital status: Widowed    Spouse name: Not on file  . Number of children: Not on file  . Years of education: Not on file  . Highest education level: Not on file  Occupational History  . Not  on file  Social Needs  . Financial resource strain: Not on file  . Food insecurity:    Worry: Not on file    Inability: Not on file  . Transportation needs:    Medical: Not on file    Non-medical: Not on file  Tobacco Use  . Smoking status: Never Smoker  . Smokeless tobacco: Never Used  Substance and Sexual Activity  . Alcohol use: No  . Drug use: Not on file  . Sexual activity: Not on file  Lifestyle  . Physical activity:    Days per week: Not on file    Minutes per session: Not on file  . Stress: Not on file  Relationships  . Social connections:    Talks on phone: Not on file    Gets together: Not on file    Attends  religious service: Not on file    Active member of club or organization: Not on file    Attends meetings of clubs or organizations: Not on file    Relationship status: Not on file  . Intimate partner violence:    Fear of current or ex partner: Not on file    Emotionally abused: Not on file    Physically abused: Not on file    Forced sexual activity: Not on file  Other Topics Concern  . Not on file  Social History Narrative  . Not on file    Functional Status Survey:    Family History  Problem Relation Age of Onset  . Cancer Other   . Diabetes Other     Health Maintenance  Topic Date Due  . INFLUENZA VACCINE  06/07/2018 (Originally 11/19/2017)  . PNA vac Low Risk Adult (1 of 2 - PCV13) 06/07/2018 (Originally 03/15/1978)  . TETANUS/TDAP  12/11/2022  . DEXA SCAN  Completed    Allergies  Allergen Reactions  . Aspirin     Stomach problem  . Codeine     hallucinate  . Penicillins Rash    Has patient had a PCN reaction causing immediate rash, facial/tongue/throat swelling, SOB or lightheadedness with hypotension: Yes Has patient had a PCN reaction causing severe rash involving mucus membranes or skin necrosis: No Has patient had a PCN reaction that required hospitalization No Has patient had a PCN reaction occurring within the last 10 years: No If all of the above answers are "NO", then may proceed with Cephalosporin use.     Outpatient Encounter Medications as of 05/10/2018  Medication Sig  . acetaminophen (TYLENOL) 500 MG tablet Take 1,000 mg by mouth every 6 (six) hours as needed for moderate pain.  Marland Kitchen albuterol (PROVENTIL) (2.5 MG/3ML) 0.083% nebulizer solution Take 3 mLs (2.5 mg total) by nebulization every 4 (four) hours as needed for wheezing or shortness of breath.  Marland Kitchen amLODipine (NORVASC) 5 MG tablet Take 1 tablet (5 mg total) by mouth daily.  Marland Kitchen omeprazole (PRILOSEC) 20 MG capsule Take 20 mg by mouth daily.   . polyethylene glycol (MIRALAX / GLYCOLAX) packet Take 17  g by mouth daily as needed for mild constipation or moderate constipation.  . senna-docusate (SENOKOT-S) 8.6-50 MG per tablet Take 1 tablet by mouth 2 (two) times daily.   No facility-administered encounter medications on file as of 05/10/2018.      Review of Systems  Constitutional: Positive for activity change and appetite change.  HENT: Negative.   Respiratory: Negative.   Cardiovascular: Positive for leg swelling.  Gastrointestinal: Negative.   Genitourinary: Negative.   Musculoskeletal: Positive  for arthralgias and myalgias.  Skin: Negative.   Neurological: Positive for weakness.  Psychiatric/Behavioral: Negative.   All other systems reviewed and are negative.   Vitals:   05/10/18 0855  BP: (!) 99/55  Pulse: 64  Resp: 20  Temp: 98 F (36.7 C)  TempSrc: Oral  SpO2: 94%   There is no height or weight on file to calculate BMI. Physical Exam Vitals signs and nursing note reviewed.  Constitutional:      Appearance: Normal appearance.  HENT:     Head: Normocephalic.     Nose: Nose normal.     Mouth/Throat:     Mouth: Mucous membranes are moist.     Pharynx: Oropharynx is clear.  Eyes:     Pupils: Pupils are equal, round, and reactive to light.  Neck:     Musculoskeletal: Neck supple.  Cardiovascular:     Rate and Rhythm: Normal rate and regular rhythm.     Pulses: Normal pulses.     Heart sounds: Normal heart sounds.  Pulmonary:     Effort: Pulmonary effort is normal. No respiratory distress.     Breath sounds: Normal breath sounds. No wheezing or rales.  Abdominal:     General: Abdomen is flat. Bowel sounds are normal. There is no distension.     Palpations: Abdomen is soft.     Tenderness: There is no abdominal tenderness. There is no guarding.  Musculoskeletal:     Comments: Moderate Swelling Bilateral  Skin:    General: Skin is warm and dry.  Neurological:     General: No focal deficit present.     Mental Status: She is alert and oriented to person,  place, and time.  Psychiatric:        Mood and Affect: Mood normal.        Thought Content: Thought content normal.        Judgment: Judgment normal.     Labs reviewed: Basic Metabolic Panel: Recent Labs    05/03/18 0219 05/04/18 0422 05/06/18 0935  NA 137 138 142  K 4.3 4.3 4.5  CL 104 104 108  CO2 27 24 26   GLUCOSE 136* 135* 109*  BUN 33* 41* 52*  CREATININE 1.16* 1.12* 1.01*  CALCIUM 8.0* 8.2* 8.4*   Liver Function Tests: Recent Labs    04/30/18 2003  AST 17  ALT 10  ALKPHOS 51  BILITOT 0.7  PROT 5.9*  ALBUMIN 3.4*   No results for input(s): LIPASE, AMYLASE in the last 8760 hours. No results for input(s): AMMONIA in the last 8760 hours. CBC: Recent Labs    05/02/18 0448 05/03/18 0219 05/03/18 1814 05/04/18 0422 05/06/18 0935  WBC 8.3 10.4 12.8* 12.0* 9.7  NEUTROABS 5.7 7.7  --   --   --   HGB 7.1* 6.6* 9.6* 9.6* 9.5*  HCT 22.2* 21.2* 30.0* 30.3* 30.5*  MCV 101.4* 101.4* 96.2 96.2 98.4  PLT 98* 99* 107* 125* 153   Cardiac Enzymes: Recent Labs    04/30/18 2338  TROPONINI <0.03   BNP: Invalid input(s): POCBNP No results found for: HGBA1C No results found for: TSH Lab Results  Component Value Date   VITAMINB12 311 05/12/2013   Lab Results  Component Value Date   FOLATE >20.0 05/12/2013   Lab Results  Component Value Date   IRON 20 (L) 05/12/2013   TIBC 242 (L) 05/12/2013   FERRITIN 58 05/12/2013    Imaging and Procedures obtained prior to SNF admission: No results found.  Assessment/Plan  Closed fracture of left hip Her Pain is not controlled on tylenol D/W the Family and they want her to be comfortable Will start her on Ultram 50 mg Q6 as she is ALLERGIC TO CODEINE Robaxin PRN for muscle spasm D/w therapy . She had refused to do therapy due to Pain. Will continue to push for therapy. WBAT Follow up with Ortho  Chronic diastolic congestive heart failure  Will Hypoxia and LE edema Will start her on Lasix Follow  Weights  Hypoxia On Oxygen CT scan of Chest was negative Most Likely Atelectasis  CKD (chronic kidney disease), stage III  BUN and Creat slightly high Will Follow Chronic anemia Will start on Iron  Essential hypertension Discontinue Norvasc as BP low and then starting Lasix also. ACP D/w the Grandson They want her to be DNR but want everything else for now.  Family/ staff Communication:   Labs/tests ordered:  Total time spent in this patient care encounter was _45 minutes; greater than 50% of the visit spent counseling patient, reviewing records , Labs and coordinating care for problems addressed at this encounter.

## 2018-05-13 ENCOUNTER — Encounter (HOSPITAL_COMMUNITY)
Admission: RE | Admit: 2018-05-13 | Discharge: 2018-05-13 | Disposition: A | Discharge status: Home / Self Care | Payer: Medicare HMO | Source: Skilled Nursing Facility | Attending: Internal Medicine | Admitting: Internal Medicine

## 2018-05-13 ENCOUNTER — Other Ambulatory Visit: Payer: Self-pay

## 2018-05-13 DIAGNOSIS — Z4789 Encounter for other orthopedic aftercare: Secondary | ICD-10-CM | POA: Insufficient documentation

## 2018-05-13 DIAGNOSIS — I130 Hypertensive heart and chronic kidney disease with heart failure and stage 1 through stage 4 chronic kidney disease, or unspecified chronic kidney disease: Secondary | ICD-10-CM | POA: Insufficient documentation

## 2018-05-13 DIAGNOSIS — D696 Thrombocytopenia, unspecified: Secondary | ICD-10-CM | POA: Insufficient documentation

## 2018-05-13 DIAGNOSIS — Z9981 Dependence on supplemental oxygen: Secondary | ICD-10-CM | POA: Insufficient documentation

## 2018-05-13 DIAGNOSIS — D649 Anemia, unspecified: Secondary | ICD-10-CM | POA: Insufficient documentation

## 2018-05-13 DIAGNOSIS — Z9181 History of falling: Secondary | ICD-10-CM | POA: Insufficient documentation

## 2018-05-13 DIAGNOSIS — E041 Nontoxic single thyroid nodule: Secondary | ICD-10-CM | POA: Insufficient documentation

## 2018-05-13 LAB — BASIC METABOLIC PANEL
Anion gap: 4 — ABNORMAL LOW (ref 5–15)
BUN: 28 mg/dL — ABNORMAL HIGH (ref 8–23)
CO2: 31 mmol/L (ref 22–32)
Calcium: 8.4 mg/dL — ABNORMAL LOW (ref 8.9–10.3)
Chloride: 104 mmol/L (ref 98–111)
Creatinine, Ser: 0.75 mg/dL (ref 0.44–1.00)
GFR calc Af Amer: >60 mL/min (ref >60)
GFR calc non Af Amer: >60 mL/min (ref >60)
Glucose, Bld: 95 mg/dL (ref 70–99)
POTASSIUM: 4.7 mmol/L (ref 3.5–5.1)
Sodium: 139 mmol/L (ref 135–145)

## 2018-05-13 LAB — CBC
HCT: 31.7 % — ABNORMAL LOW (ref 36.0–46.0)
Hemoglobin: 9.2 g/dL — ABNORMAL LOW (ref 12.0–15.0)
MCH: 30.8 pg (ref 26.0–34.0)
MCHC: 29 g/dL — ABNORMAL LOW (ref 30.0–36.0)
MCV: 106 fL — ABNORMAL HIGH (ref 80.0–100.0)
Platelets: 183 10*3/uL (ref 150–400)
RBC: 2.99 MIL/uL — AB (ref 3.87–5.11)
RDW: 14.6 % (ref 11.5–15.5)
WBC: 7.2 10*3/uL (ref 4.0–10.5)
nRBC: 0 % (ref 0.0–0.2)

## 2018-05-13 MED ORDER — TRAMADOL HCL 50 MG PO TABS: 50.0000 mg | ORAL_TABLET | Freq: Four times a day (QID) | ORAL | 0 refills | Status: DC | PRN

## 2018-05-13 NOTE — Telephone Encounter (Signed)
RX Fax for [facility]@ [phone]  

## 2018-05-20 ENCOUNTER — Inpatient Hospital Stay (INDEPENDENT_AMBULATORY_CARE_PROVIDER_SITE_OTHER): Payer: Medicare HMO

## 2018-05-20 ENCOUNTER — Encounter (INDEPENDENT_AMBULATORY_CARE_PROVIDER_SITE_OTHER): Payer: Self-pay | Admitting: Orthopaedic Surgery

## 2018-05-20 ENCOUNTER — Ambulatory Visit (INDEPENDENT_AMBULATORY_CARE_PROVIDER_SITE_OTHER): Payer: Medicare HMO | Admitting: Orthopaedic Surgery

## 2018-05-20 VITALS — BP 104/53 | HR 71 | Ht 63.0 in | Wt 159.0 lb

## 2018-05-20 DIAGNOSIS — S72142D Displaced intertrochanteric fracture of left femur, subsequent encounter for closed fracture with routine healing: Secondary | ICD-10-CM

## 2018-05-20 DIAGNOSIS — W19XXXD Unspecified fall, subsequent encounter: Secondary | ICD-10-CM

## 2018-05-20 DIAGNOSIS — M25552 Pain in left hip: Secondary | ICD-10-CM | POA: Diagnosis not present

## 2018-05-20 NOTE — Progress Notes (Signed)
   Post-Op Visit Note   Patient: Hannah Arias           Date of Birth: [birth year]           MRN: [id number] Visit Date: 05/20/2018 PCP: Celene Squibb, MD   Assessment & Plan: Follow-up left inner troches fracture.  X-rays look good staples removed.  She will return in 4 weeks for repeat 2 view x-rays left hip.  Patient is has been able to stand and is taken to step so far.  Chief Complaint:  Chief Complaint  Patient presents with  . Left Hip - Routine Post Op    05/01/2018 ORIF Left Hip   Visit Diagnoses:  1. Pain in left hip     Plan: Return 4 weeks.  Pete x-rays on return.  Follow-Up Instructions: No follow-ups on file.   Orders:  Orders Placed This Encounter  Procedures  . XR HIP UNILAT W OR W/O PELVIS 2-3 VIEWS LEFT   No orders of the defined types were placed in this encounter.   Imaging: Xr Hip Unilat W Or W/o Pelvis 2-3 Views Left  Result Date: 05/20/2018 AP frog-leg x-ray left hip obtained and reviewed.  This shows trochanteric nail with interlock.  Lesser trochanter is a separate fragment good position alignment. Impression: Unchanged postoperative position post fixation of intertrochanteric fracture, comminuted.   PMFS History: Patient Active Problem List   Diagnosis Date Noted  . Hypertension 05/10/2018  . Acute blood loss anemia 05/03/2018  . AKI (acute kidney injury) ([place]) 05/01/2018  . CKD (chronic kidney disease), stage III ([place]) 05/01/2018  . Abdominal hernia 05/01/2018  . Constipation 05/01/2018  . Chronic anemia 05/01/2018  . Hip fracture ([place]) 04/30/2018  . Hypoxia 05/11/2013  . CHF (congestive heart failure) ([place]) 05/11/2013  . CTS (carpal tunnel syndrome) 03/04/2011  . CARPAL TUNNEL SYNDROME, RIGHT 03/19/2010  . RUPTURE ROTATOR CUFF 03/19/2010  . CLOSED FRACTURE OF OLECRANON PROCESS OF ULNA 11/07/2009   Past Medical History:  Diagnosis Date  . GERD (gastroesophageal reflux disease)     Family History  Problem Relation Age of Onset  .  Cancer Other   . Diabetes Other     Past Surgical History:  Procedure Laterality Date  . APPENDECTOMY     85 yr old  . BREAST LUMPECTOMY    . CATARACT EXTRACTION    . ELBOW SURGERY    . HEMORROIDECTOMY    . HERNIA REPAIR    . INTRAMEDULLARY (IM) NAIL INTERTROCHANTERIC Left 05/01/2018   Procedure: LEFT BIOMET AFFIXUS (IM) SHORT NAIL, INTERTROCHANTRIC FRACTURE;  Surgeon: [name], MD;  Location: [place];  Service: Orthopedics;  Laterality: Left;  . SHOULDER SURGERY     left rotator cuff  . TONSILLECTOMY AND ADENOIDECTOMY     85 yr  . tubal removal     Social History   Occupational History  . Not on file  Tobacco Use  . Smoking status: Never Smoker  . Smokeless tobacco: Never Used  Substance and Sexual Activity  . Alcohol use: No  . Drug use: Not on file  . Sexual activity: Not on file

## 2018-05-26 ENCOUNTER — Non-Acute Institutional Stay (SKILLED_NURSING_FACILITY): Payer: Medicare HMO | Admitting: Internal Medicine

## 2018-05-26 ENCOUNTER — Encounter: Payer: Self-pay | Admitting: Internal Medicine

## 2018-05-26 DIAGNOSIS — N183 Chronic kidney disease, stage 3 unspecified: Secondary | ICD-10-CM

## 2018-05-26 DIAGNOSIS — I5032 Chronic diastolic (congestive) heart failure: Secondary | ICD-10-CM | POA: Diagnosis not present

## 2018-05-26 DIAGNOSIS — R141 Gas pain: Secondary | ICD-10-CM | POA: Diagnosis not present

## 2018-05-26 DIAGNOSIS — I10 Essential (primary) hypertension: Secondary | ICD-10-CM | POA: Diagnosis not present

## 2018-05-26 DIAGNOSIS — D62 Acute posthemorrhagic anemia: Secondary | ICD-10-CM | POA: Diagnosis not present

## 2018-05-26 DIAGNOSIS — Z8781 Personal history of (healed) traumatic fracture: Secondary | ICD-10-CM

## 2018-05-26 NOTE — Progress Notes (Signed)
Location:    [facility] Room Number: 102/P Place of Service:  SNF [phone]) Provider:  Cory Roughen, MD  Patient Care Team: Celene Squibb, MD as PCP - General (Internal Medicine)  Extended Emergency Contact Information Primary Emergency Contact: Pritts,EllEs Address: [address]          [place], [place] [address] [place] Phone: [phone] Mobile Phone: [phone] Relation: Son Secondary Emergency Contact: Mareta, Chesnut Mobile Phone: [phone] Relation: Grandson  Code Status:  DNR Goals of care: Advanced Directive information Advanced Directives 05/26/2018  Does Patient Have a Medical Advance Directive? Yes  Type of Advance Directive Out of facility DNR (pink MOST or yellow form)  Does patient want to make changes to medical advance directive? No - Patient declined  Copy of [place] in Chart? No - copy requested  Would patient like information on creating a medical advance directive? No - Patient declined  Pre-existing out of facility DNR order (yellow form or pink MOST form) -     Chief Complaint  Patient presents with  . Acute Visit    F/U Visit  Status post ORIF of left hip fracture after a fall-with postop anemia also history of diastolic CHF.    HPI:  Pt is a 85 y.o. female seen today for an acute visit for  Follow-up of left hip fracture with repair with resulting postop anemia-also follow-up diastolic CHF.  She also has a history of hypertension and GERD.  She fell at home and sustained the left hip fracture which was surgically repaired.  Postop she did have anemia with a hemoglobin of 6.6 and required a transfusion of 2 units of blood.  She has done well relatively speaking she is participating with therapy she has seen orthopedics and apparently thought to be doing fairly well and stable. She does receive tramadol for pain.    In regards to anemia her hemoglobin has  shown stability hemoglobin was 9.2  on lab done on January 23  She also has a history of chronic diastolic CHF and had lower extremity edema which persists however this appears to be somewhat improved I believe she has been started on low-dose Lasix.  Most recent weight was 170 which has been relatively stable this will warrant updating.  Her main complaint today is that she feels she has some abdominal gas we will order some simethicone.  Family also states as far as the bowel agents she does better Colace rather than Senokot so will start Colace instead.  Currently she is lying in bed comfortably she appears to be bright and alert apparently she does have episodes of increased confusion-she is appropriate when I spoke to her-says she just glad to be alive at her age and doing relatively well.  Vital signs appear to be stable I got a blood pressure of 118/50- occasionally she has a systolic in the 10U but this does not appear to be persistent.      Past Medical History:  Diagnosis Date  . GERD (gastroesophageal reflux disease)    Past Surgical History:  Procedure Laterality Date  . APPENDECTOMY     85 yr old  . BREAST LUMPECTOMY    . CATARACT EXTRACTION    . ELBOW SURGERY    . HEMORROIDECTOMY    . HERNIA REPAIR    . INTRAMEDULLARY (IM) NAIL INTERTROCHANTERIC Left 05/01/2018   Procedure: LEFT BIOMET AFFIXUS (IM) SHORT NAIL, INTERTROCHANTRIC FRACTURE;  Surgeon: [name], MD;  Location: [facility];  Service: Orthopedics;  Laterality: Left;  . SHOULDER SURGERY     left rotator cuff  . TONSILLECTOMY AND ADENOIDECTOMY     9 yr  . tubal removal      Allergies  Allergen Reactions  . Aspirin     Stomach problem  . Codeine     hallucinate  . Penicillins Rash    Has patient had a PCN reaction causing immediate rash, facial/tongue/throat swelling, SOB or lightheadedness with hypotension: Yes Has patient had a PCN reaction causing severe rash involving mucus membranes or skin  necrosis: No Has patient had a PCN reaction that required hospitalization No Has patient had a PCN reaction occurring within the last 10 years: No If all of the above answers are "NO", then may proceed with Cephalosporin use.     Outpatient Encounter Medications as of 05/26/2018  Medication Sig  . acetaminophen (TYLENOL) 500 MG tablet Take 1,000 mg by mouth every 6 (six) hours as needed for moderate pain.  Marland Kitchen albuterol (PROVENTIL) (2.5 MG/3ML) 0.083% nebulizer solution Take 3 mLs (2.5 mg total) by nebulization every 4 (four) hours as needed for wheezing or shortness of breath.  . furosemide (LASIX) 20 MG tablet Take 20 mg by mouth daily.  Marland Kitchen omeprazole (PRILOSEC) 20 MG capsule Take 20 mg by mouth daily.   . polyethylene glycol (MIRALAX / GLYCOLAX) packet Take 17 g by mouth daily as needed for mild constipation or moderate constipation.  . senna-docusate (SENOKOT-S) 8.6-50 MG per tablet Take 1 tablet by mouth 2 (two) times daily.  . traMADol (ULTRAM) 50 MG tablet Take 1 tablet (50 mg total) by mouth every 6 (six) hours as needed.  . [DISCONTINUED] amLODipine (NORVASC) 5 MG tablet Take 1 tablet (5 mg total) by mouth daily.  . [DISCONTINUED] HYDROcodone-acetaminophen (NORCO/VICODIN) 5-325 MG tablet Take 1 tablet by mouth every 8 hours as needed for left hip pain   No facility-administered encounter medications on file as of 05/26/2018.     Review of Systems   General she is not complaining of any fever or chills.  Skin is not complain of rashes or itching.  Head ears eyes nose mouth and throat does not complain of visual changes or sore throat.  Respiratory does not complain of shortness of breath or having a cough.  Cardiac does not complain of chest pain continues to have venous stasis edema bilaterally possibly slightly improved from admission.  GI is not complaining of nausea vomiting diarrhea constipation GU she does complain of gas pain.  GU does not complain of  dysuria.  Musculoskeletal has weakness but at this point the pain appears to be controlled with the tramadol.  Neurologic does not complain of dizziness headache numbness.  And psych does not complain of being depressed or anxious appears to be in good spirits- at times does have confusion  Immunization History  Administered Date(s) Administered  . Tdap 12/10/2012   Pertinent  Health Maintenance Due  Topic Date Due  . INFLUENZA VACCINE  06/07/2018 (Originally 11/19/2017)  . PNA vac Low Risk Adult (1 of 2 - PCV13) 06/07/2018 (Originally [birth year]/1978)  . DEXA SCAN  Completed   No flowsheet data found. Functional Status Survey:    Vitals:   05/26/18 1251  BP: (!) 107/52  Pulse: 74  Resp: 17  Temp: 98.1 F (36.7 C)  TempSrc: Oral  SpO2: 92%  Manual blood pressure was 118/50 Most recent weight is 170 pounds Physical Exam  In general this is a pleasant elderly female in no distress lying comfortably in bed.  Her skin is warm and dry surgical site left hip from what I was able to assess appears benign there is some bruising of her lower leg more mid leg.  Eyes visual acuity appears to be intact sclera and conjunctive are clear.  Oropharynx is clear mucous membranes moist.  Chest is clear to auscultation with somewhat shallow air entry there is no labored breathing.  Heart is regular rate and rhythm without murmur gallop or rub she has moderate lower extremity edema I believe this is possibly decreased from when I first saw her still slightly more edema on the left versus the right her pedal pulses vigorous edema is cool to touch nontender nonerythematous.  Abdomen is somewhat obese soft nontender with active bowel sounds.  Musculoskeletal Limited exam since she is in bed but is able to move her upper extremities at baseline with continued lower extremity weakness.  Neurologic is grossly intact her speech is clear could not really appreciate lateralizing findings.  Psych she  is pleasant and appropriate somewhat hard of hearing.    Labs reviewed: Recent Labs    05/04/18 0422 05/06/18 0935 05/13/18 0715  NA 138 142 139  K 4.3 4.5 4.7  CL 104 108 104  CO2 24 26 31   GLUCOSE 135* 109* 95  BUN 41* 52* 28*  CREATININE 1.12* 1.01* 0.75  CALCIUM 8.2* 8.4* 8.4*   Recent Labs    04/30/18 2003  AST 17  ALT 10  ALKPHOS 51  BILITOT 0.7  PROT 5.9*  ALBUMIN 3.4*   Recent Labs    05/02/18 0448 05/03/18 0219  05/04/18 0422 05/06/18 0935 05/13/18 0715  WBC 8.3 10.4   < > 12.0* 9.7 7.2  NEUTROABS 5.7 7.7  --   --   --   --   HGB 7.1* 6.6*   < > 9.6* 9.5* 9.2*  HCT 22.2* 21.2*   < > 30.3* 30.5* 31.7*  MCV 101.4* 101.4*   < > 96.2 98.4 106.0*  PLT 98* 99*   < > 125* 153 183   < > = values in this interval not displayed.   No results found for: TSH No results found for: HGBA1C No results found for: CHOL, HDL, LDLCALC, LDLDIRECT, TRIG, CHOLHDL  Significant Diagnostic Results in last 30 days:  Dg Tibia/fibula Left  Result Date: 04/30/2018 CLINICAL DATA:  Fall EXAM: LEFT TIBIA AND FIBULA - 2 VIEW COMPARISON:  None. FINDINGS: Joint space calcifications at the knee. No acute displaced fracture or malalignment. Diffuse soft tissue swelling. Vascular calcifications. IMPRESSION: No acute osseous abnormality.  Chondrocalcinosis at the knee Electronically Signed   By: Donavan Foil M.D.   On: 04/30/2018 20:50   Dg Ankle Complete Left  Result Date: 04/30/2018 CLINICAL DATA:  Fall with left-sided leg pain EXAM: LEFT ANKLE COMPLETE - 3+ VIEW COMPARISON:  None. FINDINGS: Diffuse soft tissue edema. Bones appear osteopenic. No fracture or malalignment. Moderate plantar calcaneal spur. IMPRESSION: No acute osseous abnormality. Electronically Signed   By: Donavan Foil M.D.   On: 04/30/2018 20:51   Ct Head Wo Contrast  Result Date: 04/30/2018 CLINICAL DATA:  Slipped and fell EXAM: CT HEAD WITHOUT CONTRAST CT CERVICAL SPINE WITHOUT CONTRAST TECHNIQUE: Multidetector CT  imaging of the head and cervical spine was performed following the standard protocol without intravenous contrast. Multiplanar CT image reconstructions of the cervical spine were also generated. COMPARISON:  None. FINDINGS: CT HEAD  FINDINGS Brain: Diffuse parenchymal atrophy. Patchy areas of hypoattenuation in deep and periventricular white matter bilaterally. Negative for acute intracranial hemorrhage, mass lesion, acute infarction, midline shift, or mass-effect. Acute infarct may be inapparent on noncontrast CT. Ventricles and sulci symmetric. Bilateral basal ganglia mineralization. Vascular: Atherosclerotic and physiologic intracranial calcifications. Skull: Normal. Negative for fracture or focal lesion. Sinuses/Orbits: Mucoperiosteal thickening in the left maxillary sinus. Missing dentition. Other: Left frontal scalp soft tissue swelling. CT CERVICAL SPINE FINDINGS Alignment: Mild cervical dextroscoliosis. No significant spondylolisthesis, with some degradation of reconstructions due to motion. Skull base and vertebrae: Negative for fracture or focal bone lesion. Soft tissues and spinal canal: No visible canal hematoma. No prevertebral fluid or gas. Disc levels: Asymmetric facet DJD C2-T1, left worse than right. Narrowing of interspaces C4-C7 with small endplate spurs. Upper chest: Negative. Other: Bilateral calcified carotid bifurcation plaque. IMPRESSION: 1. Negative for bleed or other acute intracranial process. 2. Atrophy and nonspecific white matter changes. 3. Negative for cervical fracture or other acute bone abnormality. 4. Multilevel cervical degenerative changes as above. Electronically Signed   By: Lucrezia Europe M.D.   On: 04/30/2018 21:29   Ct Chest W Contrast  Result Date: 05/03/2018 CLINICAL DATA:  Shortness of breath. EXAM: CT CHEST WITH CONTRAST TECHNIQUE: Multidetector CT imaging of the chest was performed during intravenous contrast administration. CONTRAST:  152mL OMNIPAQUE IOHEXOL 300 MG/ML   SOLN COMPARISON:  Radiograph of same day. FINDINGS: Cardiovascular: Atherosclerosis of thoracic aorta is noted without aneurysm or dissection. Normal cardiac size. No pericardial effusion. Mediastinum/Nodes: 2.2 cm right thyroid nodule is noted. No adenopathy is noted. Esophagus is unremarkable. Lungs/Pleura: No pneumothorax is noted. Mild right posterior basilar subsegmental atelectasis is noted. No significant pleural effusion is noted. Upper Abdomen: No acute abnormality. Musculoskeletal: No chest wall abnormality. No acute or significant osseous findings. IMPRESSION: 2.2 cm right thyroid nodule is noted. Thyroid ultrasound is recommended for further evaluation. Minimal right posterior basilar subsegmental atelectasis. Aortic Atherosclerosis (ICD10-I70.0). Electronically Signed   By: Marijo Conception, M.D.   On: 05/03/2018 15:24   Ct Cervical Spine Wo Contrast  Result Date: 04/30/2018 CLINICAL DATA:  Slipped and fell EXAM: CT HEAD WITHOUT CONTRAST CT CERVICAL SPINE WITHOUT CONTRAST TECHNIQUE: Multidetector CT imaging of the head and cervical spine was performed following the standard protocol without intravenous contrast. Multiplanar CT image reconstructions of the cervical spine were also generated. COMPARISON:  None. FINDINGS: CT HEAD FINDINGS Brain: Diffuse parenchymal atrophy. Patchy areas of hypoattenuation in deep and periventricular white matter bilaterally. Negative for acute intracranial hemorrhage, mass lesion, acute infarction, midline shift, or mass-effect. Acute infarct may be inapparent on noncontrast CT. Ventricles and sulci symmetric. Bilateral basal ganglia mineralization. Vascular: Atherosclerotic and physiologic intracranial calcifications. Skull: Normal. Negative for fracture or focal lesion. Sinuses/Orbits: Mucoperiosteal thickening in the left maxillary sinus. Missing dentition. Other: Left frontal scalp soft tissue swelling. CT CERVICAL SPINE FINDINGS Alignment: Mild cervical  dextroscoliosis. No significant spondylolisthesis, with some degradation of reconstructions due to motion. Skull base and vertebrae: Negative for fracture or focal bone lesion. Soft tissues and spinal canal: No visible canal hematoma. No prevertebral fluid or gas. Disc levels: Asymmetric facet DJD C2-T1, left worse than right. Narrowing of interspaces C4-C7 with small endplate spurs. Upper chest: Negative. Other: Bilateral calcified carotid bifurcation plaque. IMPRESSION: 1. Negative for bleed or other acute intracranial process. 2. Atrophy and nonspecific white matter changes. 3. Negative for cervical fracture or other acute bone abnormality. 4. Multilevel cervical degenerative changes as above. Electronically Signed  By: Lucrezia Europe M.D.   On: 04/30/2018 21:29   Ct Abdomen Pelvis W Contrast  Result Date: 04/30/2018 CLINICAL DATA:  Slip and fall injury. EXAM: CT ABDOMEN AND PELVIS WITH CONTRAST TECHNIQUE: Multidetector CT imaging of the abdomen and pelvis was performed using the standard protocol following bolus administration of intravenous contrast. CONTRAST:  158mL ISOVUE-370 IOPAMIDOL (ISOVUE-370) INJECTION 76% COMPARISON:  None. FINDINGS: Lower chest: Motion artifact limits evaluation. Linear atelectasis in the lung bases. Hepatobiliary: Motion artifact limits evaluation. Several stones in the gallbladder. No definite gallbladder wall thickening or infiltration. No bile duct dilatation. Benign-appearing cyst in the left lobe of the liver measuring about 3.7 cm diameter. Pancreas: Atrophic appearance of the pancreas. No focal lesions identified. Spleen: Normal in size without focal abnormality. Adrenals/Urinary Tract: Adrenal glands are unremarkable. Kidneys are normal, without renal calculi, focal lesion, or hydronephrosis. Bladder is unremarkable. Stomach/Bowel: Stomach, small bowel, and colon are not abnormally distended. Stool throughout the colon. Prominent stool in the rectum. Right lower quadrant  abdominal wall hernia consistent with spigelian hernia containing a portion of the right colon but without proximal obstruction. Vascular/Lymphatic: Tortuous and calcified aorta without aneurysmal dilatation. Inferior vena cava is unremarkable. No significant lymphadenopathy. Reproductive: Somewhat atrophic uterus with calcified fibroids. Suggestion of fluid in the endometrium which would be abnormal in a patient of this age. Consider gynecology examination or follow-up pelvic ultrasound for further evaluation. Other: No free air or free fluid in the abdomen. Musculoskeletal: Degenerative changes throughout the lumbar spine. Comminuted inter trochanteric fractures of the left hip with varus angulation appear acute. No dislocation. Old appearing fracture deformities of the left superior and inferior pubic rami. IMPRESSION: 1. Acute comminuted inter trochanteric fractures of the left hip with varus angulation. No dislocation. 2. Cholelithiasis without evidence of cholecystitis. 3. Right lower quadrant abdominal wall hernia containing a portion of the right colon but without proximal obstruction. This could represent acute or chronic finding. 4. Somewhat atrophic uterus with calcified fibroids. Suggestion of fluid in the endometrium which would be abnormal in a patient of this age. Consider gynecology examination or follow-up pelvic ultrasound for further evaluation. Aortic Atherosclerosis (ICD10-I70.0). Electronically Signed   By: Lucienne Capers M.D.   On: 04/30/2018 21:40   Dg Pelvis Portable  Result Date: 04/30/2018 CLINICAL DATA:  Fall EXAM: PORTABLE PELVIS 1-2 VIEWS COMPARISON:  None. FINDINGS: Acute slightly comminuted fracture involving the left trochanter with cephalad migration of the distal femur. The left femoral head projects in joint. There may be additional left superior pubic ramus fracture. Moderate to marked arthritis of the right hip. IMPRESSION: 1. Acute displaced left trochanteric fracture 2.  Possible acute left superior pubic ramus fracture Electronically Signed   By: Donavan Foil M.D.   On: 04/30/2018 20:49   Ct L-spine No Charge  Result Date: 04/30/2018 CLINICAL DATA:  Initial evaluation for acute trauma, fall. EXAM: CT LUMBAR SPINE WITHOUT CONTRAST TECHNIQUE: Multidetector CT imaging of the lumbar spine was performed without intravenous contrast administration. Multiplanar CT image reconstructions were also generated. COMPARISON:  Concomitant CT of the abdomen and pelvis performed at the same time. Comparison also made with prior CT from 06/21/2015. FINDINGS: Segmentation: Transitional lumbosacral anatomy. For the purposes of this dictation the lowest rib-bearing vertebra will be labeled T12. L5 vertebral body sacralized with rudimentary L5-S1 joint. Alignment: Levoscoliosis with apex at L2-3. Trace chronic anterolisthesis of L4 on L5, stable. Vertebrae: Vertebral body heights maintained without evidence for acute or chronic fracture. Small chronic endplate Schmorl's nodes present  at the superior endplates of L2 and L3. Visualized sacrum and pelvis intact. SI joints approximated and symmetric. No discrete osseous lesions. Diffuse osteopenia. Paraspinal and other soft tissues: Paraspinous soft tissues demonstrate no acute finding. Chronic fatty atrophy noted within the posterior paraspinous musculature. Large volume stool seen impacted within the rectal vault. Extensive aorto bi-iliac atherosclerotic disease. Disc levels: T12-L1: Chronic intervertebral disc space narrowing with endplate osteophytic spurring. Mild facet hypertrophy. No significant stenosis. L1-2: Chronic intervertebral disc space narrowing with mild diffuse disc bulge and disc desiccation. Mild bilateral facet hypertrophy. No significant spinal stenosis. Moderate to severe right with mild left L1 foraminal stenosis. L2-3: Mild diffuse disc bulge with intervertebral disc space narrowing. Endplate osteophytic spurring anteriorly.  Mild bilateral facet hypertrophy. No significant spinal stenosis. Foramina are patent. L3-4: Chronic intervertebral disc space narrowing with bridging syndesmophytes. Moderate facet arthrosis. Mild spinal stenosis with bilateral L3 foraminal narrowing. L4-5: Chronic trace anterolisthesis. Partial ankylosis with bridging syndesmophyte aches per ring. Advanced facet arthrosis. No significant spinal stenosis. Moderate bilateral L4 foraminal narrowing, left greater than right. L5-S1: Transitional lumbosacral anatomy with sacralization of the L5 vertebral body and rudimentary L5-S1 disc. No significant stenosis. IMPRESSION: 1. No acute traumatic injury within the lumbar spine. 2. Levoscoliosis with associated mild to moderate multilevel degenerative spondylolysis and facet arthrosis as above, similar to previous. 3. Large volume stool impacted within the rectal vault. 4. Aortic atherosclerosis. Electronically Signed   By: Jeannine Boga M.D.   On: 04/30/2018 22:26   Dg Chest Port 1 View  Result Date: 05/03/2018 CLINICAL DATA:  85 year old female with hypoxia. Subsequent encounter. EXAM: PORTABLE CHEST 1 VIEW COMPARISON:  04/30/2008 and 04/22/2017 chest x-ray. FINDINGS: Cardiomegaly. Calcified tortuous aorta. Central pulmonary vascular prominence without pulmonary edema. Right paratracheal fullness unchanged. No segmental consolidation or pneumothorax. Bilateral shoulder joint degenerative changes. IMPRESSION: 1. No segmental consolidation or pulmonary edema. 2.  Aortic Atherosclerosis (ICD10-I70.0). Calcified tortuous aorta. 3. Prominence right paratracheal region unchanged and may be secondary to vasculature or substernal extension of enlarged thyroid gland. 4. Cardiomegaly. Electronically Signed   By: Genia Del M.D.   On: 05/03/2018 09:38   Dg Chest Port 1 View  Result Date: 04/30/2018 CLINICAL DATA:  Fall EXAM: PORTABLE CHEST 1 VIEW COMPARISON:  04/22/2017, CT cervical spine 06/21/2015, radiograph  05/13/2013 FINDINGS: No acute airspace disease or pleural effusion. Mild cardiomegaly with aortic atherosclerosis. No pneumothorax. Left upper chronic rib fractures. Prominent right paratracheal opacity, likely corresponding to CT demonstrated thyroid masses IMPRESSION: 1. Mild cardiomegaly without acute opacity 2. Similar appearance of right paratracheal mass, presumably corresponding to thyroid mass on previous CT. Electronically Signed   By: Donavan Foil M.D.   On: 04/30/2018 20:45   Dg Knee Complete 4 Views Left  Result Date: 04/30/2018 CLINICAL DATA:  Fall EXAM: LEFT KNEE - COMPLETE 4+ VIEW COMPARISON:  None. FINDINGS: Bones appear osteopenic. Joint space calcifications. No fracture or malalignment. Moderate degenerative changes of the medial and lateral joint space with mild patellofemoral degenerative change. No knee effusion. Vascular calcification IMPRESSION: Degenerative changes without acute osseous abnormality Electronically Signed   By: Donavan Foil M.D.   On: 04/30/2018 20:47   Dg C-arm 1-60 Min  Result Date: 05/01/2018 CLINICAL DATA:  ORIF of an intertrochanteric LEFT femoral neck fracture. EXAM: Operative HIP (WITH OR WITHOUT PELVIS) 2-3V LEFT COMPARISON:  04/30/2018. FINDINGS: 3 spot images from the C-arm fluoroscopic device, AP and LATERAL views of the LEFT hip are submitted for interpretation post-operatively. ORIF of the intertrochanteric femoral neck  fracture with intramedullary nail and compression screw placement. Near anatomic alignment. The radiologic technologist documented 39 seconds of fluoroscopy time period IMPRESSION: Near anatomic alignment post ORIF of the intertrochanteric LEFT femoral neck fracture. Electronically Signed   By: Evangeline Dakin M.D.   On: 05/01/2018 15:07   Dg Hip Unilat With Pelvis 2-3 Views Left  Result Date: 05/01/2018 CLINICAL DATA:  ORIF of an intertrochanteric LEFT femoral neck fracture. EXAM: Operative HIP (WITH OR WITHOUT PELVIS) 2-3V LEFT  COMPARISON:  04/30/2018. FINDINGS: 3 spot images from the C-arm fluoroscopic device, AP and LATERAL views of the LEFT hip are submitted for interpretation post-operatively. ORIF of the intertrochanteric femoral neck fracture with intramedullary nail and compression screw placement. Near anatomic alignment. The radiologic technologist documented 39 seconds of fluoroscopy time period IMPRESSION: Near anatomic alignment post ORIF of the intertrochanteric LEFT femoral neck fracture. Electronically Signed   By: Evangeline Dakin M.D.   On: 05/01/2018 15:07   Dg Femur Portable Min 2 Views Left  Result Date: 04/30/2018 CLINICAL DATA:  Fall EXAM: LEFT FEMUR PORTABLE 2 VIEWS COMPARISON:  None. FINDINGS: Acute slightly comminuted and displaced left intertrochanteric fracture. Mid to distal femur appear intact. Vascular calcifications. IMPRESSION: Acute slightly comminuted and displaced left intertrochanteric fracture Electronically Signed   By: Donavan Foil M.D.   On: 04/30/2018 20:46   Xr Hip Unilat W Or W/o Pelvis 2-3 Views Left  Result Date: 05/20/2018 AP frog-leg x-ray left hip obtained and reviewed.  This shows trochanteric nail with interlock.  Lesser trochanter is a separate fragment good position alignment. Impression: Unchanged postoperative position post fixation of intertrochanteric fracture, comminuted.   Assessment/Plan #1 History of left hip fracture with repair- at this point pain appears to be better controlled with the tramadol-she appears to have tolerated this well.  She apparently is participating with therapy.  She has had follow-up with Ortho and will have continued follow-up she is weightbearing as tolerated.   #2 History of chronic diastolic CHF her weight appears to be stable but edema appears to be somewhat improved ---  She is on chronic oxygen at this point  At this point will monitor-possibly she has had some better p.o. intake although staff reports since diet was changed  this appears to have decreased  #3-hypoxia this is thought to be most likely atelectasis CT scan of the chest was negative for any acute process in the hospital.  4.  History of chronic kidney disease--on January 23 creatinine was 0.75 which shows stability will monitor.  5 history of chronic anemia we will start her on iron and update a CBC next week also obtain a metabolic panel at that time.   #6 hypertension this appears relatively stable she was taken on her Norvasc secondary to hypotension concerns with the initiation of Lasix- at this point will monitor.  7 history of gas pain--will  Write  order for as needed simethicone.  Also family has requested that Senokot be changed to Colace which we will do she is also on MiraLAX.   #8- nursing staff is noted that she was recently upgraded to a D2 diet and they do not feel she is eating as well since the change will have dietary reassess this.  CPT [phone] t

## 2018-05-28 ENCOUNTER — Encounter: Payer: Self-pay | Admitting: Internal Medicine

## 2018-05-28 ENCOUNTER — Non-Acute Institutional Stay (SKILLED_NURSING_FACILITY): Payer: Medicare HMO | Admitting: Internal Medicine

## 2018-05-28 DIAGNOSIS — D62 Acute posthemorrhagic anemia: Secondary | ICD-10-CM | POA: Diagnosis not present

## 2018-05-28 DIAGNOSIS — S72002S Fracture of unspecified part of neck of left femur, sequela: Secondary | ICD-10-CM

## 2018-05-28 DIAGNOSIS — R0902 Hypoxemia: Secondary | ICD-10-CM

## 2018-05-28 DIAGNOSIS — I5032 Chronic diastolic (congestive) heart failure: Secondary | ICD-10-CM

## 2018-05-28 NOTE — Progress Notes (Signed)
Location:    [facility] Room Number: 102/P Place of Service:  SNF [phone]) Provider:  Cory Roughen, MD  Patient Care Team: Celene Squibb, MD as PCP - General (Internal Medicine)  Extended Emergency Contact Information Primary Emergency Contact: Ruedas,EllEs Address: [address]          [place], [place] [address] [place] Phone: [phone] Mobile Phone: [phone] Relation: Son Secondary Emergency Contact: Theodora, Lalanne Mobile Phone: [phone] Relation: Grandson  Code Status:  DNR Goals of care: Advanced Directive information Advanced Directives 05/28/2018  Does Patient Have a Medical Advance Directive? Yes  Type of Advance Directive Out of facility DNR (pink MOST or yellow form)  Does patient want to make changes to medical advance directive? -  Copy of [place] in Chart? No - copy requested  Would patient like information on creating a medical advance directive? No - Patient declined  Pre-existing out of facility DNR order (yellow form or pink MOST form) -     Chief Complaint  Patient presents with  . Acute Visit    Hypoxia    HPI:  Pt is a 85 y.o. female seen today for an acute visit for follow-up of transient hypoxia during therapy.  Patient is here for rehab after sustaining a left hip fracture after a fall.  She also has a history of postop anemia is stabilized status post transfusion.  She also has a history of hypertension and GERD.  She has done quite well with her therapy considering her advanced age.  And she is now receiving tramadol as needed for pain and this appears to be helping.  Apparently during therapy she was somewhat nervous and O2 stats were taken which were apparently in the 70s- she appeared to be stable however without any sign of distress.  She was taken back upstairs and she appeared to be at her baseline she appeared to be comfortable without  complaints of shortness of breath she appeared to be less nervous we did take her oxygen saturation and it was in the  90s on her baseline 2 L of oxygen.  She was talking without any additional effort appeared to be again essentially at her baseline.  Physical exam was baseline as well- she does have chronic lower extremity edema but per nursing they feel this is actually improved somewhat and appears she has lost about 5 pounds since her admission.  She is on Lasix with a history of diastolic CHF.  She did have hypoxia in the hospital CT scan of the chest was negative for any acute process chest x-ray showed suspected atelectasis   Past Medical History:  Diagnosis Date  . GERD (gastroesophageal reflux disease)    Past Surgical History:  Procedure Laterality Date  . APPENDECTOMY     85 yr old  . BREAST LUMPECTOMY    . CATARACT EXTRACTION    . ELBOW SURGERY    . HEMORROIDECTOMY    . HERNIA REPAIR    . INTRAMEDULLARY (IM) NAIL INTERTROCHANTERIC Left 05/01/2018   Procedure: LEFT BIOMET AFFIXUS (IM) SHORT NAIL, INTERTROCHANTRIC FRACTURE;  Surgeon: [name], MD;  Location: [facility];  Service: Orthopedics;  Laterality: Left;  . SHOULDER SURGERY     left rotator cuff  . TONSILLECTOMY AND ADENOIDECTOMY     9 yr  . tubal removal      Allergies  Allergen Reactions  . Aspirin     Stomach  problem  . Codeine     hallucinate  . Penicillins Rash    Has patient had a PCN reaction causing immediate rash, facial/tongue/throat swelling, SOB or lightheadedness with hypotension: Yes Has patient had a PCN reaction causing severe rash involving mucus membranes or skin necrosis: No Has patient had a PCN reaction that required hospitalization No Has patient had a PCN reaction occurring within the last 10 years: No If all of the above answers are "NO", then may proceed with Cephalosporin use.     Outpatient Encounter Medications as of 05/28/2018  Medication Sig  . acetaminophen (TYLENOL) 500  MG tablet Take 1,000 mg by mouth every 6 (six) hours as needed for moderate pain.  Marland Kitchen albuterol (PROVENTIL) (2.5 MG/3ML) 0.083% nebulizer solution Take 3 mLs (2.5 mg total) by nebulization every 4 (four) hours as needed for wheezing or shortness of breath.  . docusate sodium (COLACE) 100 MG capsule Take 100 mg by mouth 2 (two) times daily.  . ferrous sulfate 325 (65 FE) MG EC tablet Take 325 mg by mouth daily with breakfast.  . furosemide (LASIX) 20 MG tablet Take 20 mg by mouth daily.  Marland Kitchen omeprazole (PRILOSEC) 20 MG capsule Take 20 mg by mouth daily.   . polyethylene glycol (MIRALAX / GLYCOLAX) packet Take 17 g by mouth daily as needed for mild constipation or moderate constipation.  . simethicone (MYLICON) 80 MG chewable tablet Chew 80 mg by mouth every 4 (four) hours as needed for flatulence.  . traMADol (ULTRAM) 50 MG tablet Take 1 tablet (50 mg total) by mouth every 6 (six) hours as needed.  . [DISCONTINUED] senna-docusate (SENOKOT-S) 8.6-50 MG per tablet Take 1 tablet by mouth 2 (two) times daily.   No facility-administered encounter medications on file as of 05/28/2018.     Review of Systems   In general she not complaining of any fever chills says she feels like her self says she feels  better than she did in therapy again she was nervous there apparently  Skin does not complain of rashes itching or diaphoresis.  Head ears eyes nose mouth and throat is not complain of visual changes or sore throat.  Respiratory is not complaining of a cough or shortness of breath currently.  She is on oxygen and has been on this during her stay here.  Cardiac does not complain of chest pain has venous stasis edema bilaterally per nursing they feel this has improved somewhat.  GI is not complaining of abdominal pain nausea or vomiting diarrhea constipation.  GU is not complaining of dysuria.  Musculoskeletal at this point pain appears to be controlled she is not complaining of discomfort does have  lower extremity weakness.  Neurologic does not complain of any syncope or feeling dizzy or numb or having a headache.  And psych continues to be in good spirits says she is feeling better less nervous than she was down in therapy  Immunization History  Administered Date(s) Administered  . Tdap 12/10/2012   Pertinent  Health Maintenance Due  Topic Date Due  . INFLUENZA VACCINE  06/07/2018 (Originally 11/19/2017)  . PNA vac Low Risk Adult (1 of 2 - PCV13) 06/07/2018 (Originally [birth year]/15/1978)  . DEXA SCAN  Completed   No flowsheet data found. Functional Status Survey:    Vitals:   05/28/18 1221  BP: 133/63  Pulse: 77  Resp: (!) 22  Temp: (!) 97.2 F (36.2 C)  TempSrc: Oral  SpO2: 96%  Weight: 163 lb 6.4 oz (74.1 kg)  Body mass index is 28.95 kg/m. Physical Exam   In general this is a pleasant elderly female in no distress sitting comfortably in her wheelchair now she initially was a bit nervous initially after coming up from therapy  Her skin is warm and dry no diaphoresis.  Eyes visual acuity continues to be intact sclera and conjunctive are clear.  Oropharynx is clear mucous membranes moist.  Chest is clear to auscultation with somewhat shallow air entry there is no labored breathing or overt congestion  Heart is regular rate and rhythm per rub heart sounds are somewhat distant.  She does have lower extremity edema but per nursing this appears to be gradually improving --this is cool to touch nonerythematous nontender  Abdomen is soft nontender with positive bowel sounds. It is obese.  Musculoskeletal is able to move all extremities x4 with lower extremity weakness especially the left lower extremity status post surgery.  Neurologic is grossly intact her speech is clear without lateralizing findings.  Psych she is pleasant and appropriate largely oriented appears to be at her baseline bright and alert.     Labs reviewed: Recent Labs    05/04/18 0422  05/06/18 0935 05/13/18 0715  NA 138 142 139  K 4.3 4.5 4.7  CL 104 108 104  CO2 24 26 31   GLUCOSE 135* 109* 95  BUN 41* 52* 28*  CREATININE 1.12* 1.01* 0.75  CALCIUM 8.2* 8.4* 8.4*   Recent Labs    04/30/18 2003  AST 17  ALT 10  ALKPHOS 51  BILITOT 0.7  PROT 5.9*  ALBUMIN 3.4*   Recent Labs    05/02/18 0448 05/03/18 0219  05/04/18 0422 05/06/18 0935 05/13/18 0715  WBC 8.3 10.4   < > 12.0* 9.7 7.2  NEUTROABS 5.7 7.7  --   --   --   --   HGB 7.1* 6.6*   < > 9.6* 9.5* 9.2*  HCT 22.2* 21.2*   < > 30.3* 30.5* 31.7*  MCV 101.4* 101.4*   < > 96.2 98.4 106.0*  PLT 98* 99*   < > 125* 153 183   < > = values in this interval not displayed.   No results found for: TSH No results found for: HGBA1C No results found for: CHOL, HDL, LDLCALC, LDLDIRECT, TRIG, CHOLHDL  Significant Diagnostic Results in last 30 days:  Dg Tibia/fibula Left  Result Date: 04/30/2018 CLINICAL DATA:  Fall EXAM: LEFT TIBIA AND FIBULA - 2 VIEW COMPARISON:  None. FINDINGS: Joint space calcifications at the knee. No acute displaced fracture or malalignment. Diffuse soft tissue swelling. Vascular calcifications. IMPRESSION: No acute osseous abnormality.  Chondrocalcinosis at the knee Electronically Signed   By: Donavan Foil M.D.   On: 04/30/2018 20:50   Dg Ankle Complete Left  Result Date: 04/30/2018 CLINICAL DATA:  Fall with left-sided leg pain EXAM: LEFT ANKLE COMPLETE - 3+ VIEW COMPARISON:  None. FINDINGS: Diffuse soft tissue edema. Bones appear osteopenic. No fracture or malalignment. Moderate plantar calcaneal spur. IMPRESSION: No acute osseous abnormality. Electronically Signed   By: Donavan Foil M.D.   On: 04/30/2018 20:51   Ct Head Wo Contrast  Result Date: 04/30/2018 CLINICAL DATA:  Slipped and fell EXAM: CT HEAD WITHOUT CONTRAST CT CERVICAL SPINE WITHOUT CONTRAST TECHNIQUE: Multidetector CT imaging of the head and cervical spine was performed following the standard protocol without intravenous  contrast. Multiplanar CT image reconstructions of the cervical spine were also generated. COMPARISON:  None. FINDINGS: CT HEAD FINDINGS Brain: Diffuse parenchymal atrophy. Patchy  areas of hypoattenuation in deep and periventricular white matter bilaterally. Negative for acute intracranial hemorrhage, mass lesion, acute infarction, midline shift, or mass-effect. Acute infarct may be inapparent on noncontrast CT. Ventricles and sulci symmetric. Bilateral basal ganglia mineralization. Vascular: Atherosclerotic and physiologic intracranial calcifications. Skull: Normal. Negative for fracture or focal lesion. Sinuses/Orbits: Mucoperiosteal thickening in the left maxillary sinus. Missing dentition. Other: Left frontal scalp soft tissue swelling. CT CERVICAL SPINE FINDINGS Alignment: Mild cervical dextroscoliosis. No significant spondylolisthesis, with some degradation of reconstructions due to motion. Skull base and vertebrae: Negative for fracture or focal bone lesion. Soft tissues and spinal canal: No visible canal hematoma. No prevertebral fluid or gas. Disc levels: Asymmetric facet DJD C2-T1, left worse than right. Narrowing of interspaces C4-C7 with small endplate spurs. Upper chest: Negative. Other: Bilateral calcified carotid bifurcation plaque. IMPRESSION: 1. Negative for bleed or other acute intracranial process. 2. Atrophy and nonspecific white matter changes. 3. Negative for cervical fracture or other acute bone abnormality. 4. Multilevel cervical degenerative changes as above. Electronically Signed   By: Lucrezia Europe M.D.   On: 04/30/2018 21:29   Ct Chest W Contrast  Result Date: 05/03/2018 CLINICAL DATA:  Shortness of breath. EXAM: CT CHEST WITH CONTRAST TECHNIQUE: Multidetector CT imaging of the chest was performed during intravenous contrast administration. CONTRAST:  197mL OMNIPAQUE IOHEXOL 300 MG/ML  SOLN COMPARISON:  Radiograph of same day. FINDINGS: Cardiovascular: Atherosclerosis of thoracic aorta is  noted without aneurysm or dissection. Normal cardiac size. No pericardial effusion. Mediastinum/Nodes: 2.2 cm right thyroid nodule is noted. No adenopathy is noted. Esophagus is unremarkable. Lungs/Pleura: No pneumothorax is noted. Mild right posterior basilar subsegmental atelectasis is noted. No significant pleural effusion is noted. Upper Abdomen: No acute abnormality. Musculoskeletal: No chest wall abnormality. No acute or significant osseous findings. IMPRESSION: 2.2 cm right thyroid nodule is noted. Thyroid ultrasound is recommended for further evaluation. Minimal right posterior basilar subsegmental atelectasis. Aortic Atherosclerosis (ICD10-I70.0). Electronically Signed   By: Marijo Conception, M.D.   On: 05/03/2018 15:24   Ct Cervical Spine Wo Contrast  Result Date: 04/30/2018 CLINICAL DATA:  Slipped and fell EXAM: CT HEAD WITHOUT CONTRAST CT CERVICAL SPINE WITHOUT CONTRAST TECHNIQUE: Multidetector CT imaging of the head and cervical spine was performed following the standard protocol without intravenous contrast. Multiplanar CT image reconstructions of the cervical spine were also generated. COMPARISON:  None. FINDINGS: CT HEAD FINDINGS Brain: Diffuse parenchymal atrophy. Patchy areas of hypoattenuation in deep and periventricular white matter bilaterally. Negative for acute intracranial hemorrhage, mass lesion, acute infarction, midline shift, or mass-effect. Acute infarct may be inapparent on noncontrast CT. Ventricles and sulci symmetric. Bilateral basal ganglia mineralization. Vascular: Atherosclerotic and physiologic intracranial calcifications. Skull: Normal. Negative for fracture or focal lesion. Sinuses/Orbits: Mucoperiosteal thickening in the left maxillary sinus. Missing dentition. Other: Left frontal scalp soft tissue swelling. CT CERVICAL SPINE FINDINGS Alignment: Mild cervical dextroscoliosis. No significant spondylolisthesis, with some degradation of reconstructions due to motion. Skull  base and vertebrae: Negative for fracture or focal bone lesion. Soft tissues and spinal canal: No visible canal hematoma. No prevertebral fluid or gas. Disc levels: Asymmetric facet DJD C2-T1, left worse than right. Narrowing of interspaces C4-C7 with small endplate spurs. Upper chest: Negative. Other: Bilateral calcified carotid bifurcation plaque. IMPRESSION: 1. Negative for bleed or other acute intracranial process. 2. Atrophy and nonspecific white matter changes. 3. Negative for cervical fracture or other acute bone abnormality. 4. Multilevel cervical degenerative changes as above. Electronically Signed   By: Eden Emms.D.  On: 04/30/2018 21:29   Ct Abdomen Pelvis W Contrast  Result Date: 04/30/2018 CLINICAL DATA:  Slip and fall injury. EXAM: CT ABDOMEN AND PELVIS WITH CONTRAST TECHNIQUE: Multidetector CT imaging of the abdomen and pelvis was performed using the standard protocol following bolus administration of intravenous contrast. CONTRAST:  134mL ISOVUE-370 IOPAMIDOL (ISOVUE-370) INJECTION 76% COMPARISON:  None. FINDINGS: Lower chest: Motion artifact limits evaluation. Linear atelectasis in the lung bases. Hepatobiliary: Motion artifact limits evaluation. Several stones in the gallbladder. No definite gallbladder wall thickening or infiltration. No bile duct dilatation. Benign-appearing cyst in the left lobe of the liver measuring about 3.7 cm diameter. Pancreas: Atrophic appearance of the pancreas. No focal lesions identified. Spleen: Normal in size without focal abnormality. Adrenals/Urinary Tract: Adrenal glands are unremarkable. Kidneys are normal, without renal calculi, focal lesion, or hydronephrosis. Bladder is unremarkable. Stomach/Bowel: Stomach, small bowel, and colon are not abnormally distended. Stool throughout the colon. Prominent stool in the rectum. Right lower quadrant abdominal wall hernia consistent with spigelian hernia containing a portion of the right colon but without proximal  obstruction. Vascular/Lymphatic: Tortuous and calcified aorta without aneurysmal dilatation. Inferior vena cava is unremarkable. No significant lymphadenopathy. Reproductive: Somewhat atrophic uterus with calcified fibroids. Suggestion of fluid in the endometrium which would be abnormal in a patient of this age. Consider gynecology examination or follow-up pelvic ultrasound for further evaluation. Other: No free air or free fluid in the abdomen. Musculoskeletal: Degenerative changes throughout the lumbar spine. Comminuted inter trochanteric fractures of the left hip with varus angulation appear acute. No dislocation. Old appearing fracture deformities of the left superior and inferior pubic rami. IMPRESSION: 1. Acute comminuted inter trochanteric fractures of the left hip with varus angulation. No dislocation. 2. Cholelithiasis without evidence of cholecystitis. 3. Right lower quadrant abdominal wall hernia containing a portion of the right colon but without proximal obstruction. This could represent acute or chronic finding. 4. Somewhat atrophic uterus with calcified fibroids. Suggestion of fluid in the endometrium which would be abnormal in a patient of this age. Consider gynecology examination or follow-up pelvic ultrasound for further evaluation. Aortic Atherosclerosis (ICD10-I70.0). Electronically Signed   By: Lucienne Capers M.D.   On: 04/30/2018 21:40   Dg Pelvis Portable  Result Date: 04/30/2018 CLINICAL DATA:  Fall EXAM: PORTABLE PELVIS 1-2 VIEWS COMPARISON:  None. FINDINGS: Acute slightly comminuted fracture involving the left trochanter with cephalad migration of the distal femur. The left femoral head projects in joint. There may be additional left superior pubic ramus fracture. Moderate to marked arthritis of the right hip. IMPRESSION: 1. Acute displaced left trochanteric fracture 2. Possible acute left superior pubic ramus fracture Electronically Signed   By: Donavan Foil M.D.   On: 04/30/2018  20:49   Ct L-spine No Charge  Result Date: 04/30/2018 CLINICAL DATA:  Initial evaluation for acute trauma, fall. EXAM: CT LUMBAR SPINE WITHOUT CONTRAST TECHNIQUE: Multidetector CT imaging of the lumbar spine was performed without intravenous contrast administration. Multiplanar CT image reconstructions were also generated. COMPARISON:  Concomitant CT of the abdomen and pelvis performed at the same time. Comparison also made with prior CT from 06/21/2015. FINDINGS: Segmentation: Transitional lumbosacral anatomy. For the purposes of this dictation the lowest rib-bearing vertebra will be labeled T12. L5 vertebral body sacralized with rudimentary L5-S1 joint. Alignment: Levoscoliosis with apex at L2-3. Trace chronic anterolisthesis of L4 on L5, stable. Vertebrae: Vertebral body heights maintained without evidence for acute or chronic fracture. Small chronic endplate Schmorl's nodes present at the superior endplates of L2 and  L3. Visualized sacrum and pelvis intact. SI joints approximated and symmetric. No discrete osseous lesions. Diffuse osteopenia. Paraspinal and other soft tissues: Paraspinous soft tissues demonstrate no acute finding. Chronic fatty atrophy noted within the posterior paraspinous musculature. Large volume stool seen impacted within the rectal vault. Extensive aorto bi-iliac atherosclerotic disease. Disc levels: T12-L1: Chronic intervertebral disc space narrowing with endplate osteophytic spurring. Mild facet hypertrophy. No significant stenosis. L1-2: Chronic intervertebral disc space narrowing with mild diffuse disc bulge and disc desiccation. Mild bilateral facet hypertrophy. No significant spinal stenosis. Moderate to severe right with mild left L1 foraminal stenosis. L2-3: Mild diffuse disc bulge with intervertebral disc space narrowing. Endplate osteophytic spurring anteriorly. Mild bilateral facet hypertrophy. No significant spinal stenosis. Foramina are patent. L3-4: Chronic intervertebral  disc space narrowing with bridging syndesmophytes. Moderate facet arthrosis. Mild spinal stenosis with bilateral L3 foraminal narrowing. L4-5: Chronic trace anterolisthesis. Partial ankylosis with bridging syndesmophyte aches per ring. Advanced facet arthrosis. No significant spinal stenosis. Moderate bilateral L4 foraminal narrowing, left greater than right. L5-S1: Transitional lumbosacral anatomy with sacralization of the L5 vertebral body and rudimentary L5-S1 disc. No significant stenosis. IMPRESSION: 1. No acute traumatic injury within the lumbar spine. 2. Levoscoliosis with associated mild to moderate multilevel degenerative spondylolysis and facet arthrosis as above, similar to previous. 3. Large volume stool impacted within the rectal vault. 4. Aortic atherosclerosis. Electronically Signed   By: Jeannine Boga M.D.   On: 04/30/2018 22:26   Dg Chest Port 1 View  Result Date: 05/03/2018 CLINICAL DATA:  85 year old female with hypoxia. Subsequent encounter. EXAM: PORTABLE CHEST 1 VIEW COMPARISON:  04/30/2008 and 04/22/2017 chest x-ray. FINDINGS: Cardiomegaly. Calcified tortuous aorta. Central pulmonary vascular prominence without pulmonary edema. Right paratracheal fullness unchanged. No segmental consolidation or pneumothorax. Bilateral shoulder joint degenerative changes. IMPRESSION: 1. No segmental consolidation or pulmonary edema. 2.  Aortic Atherosclerosis (ICD10-I70.0). Calcified tortuous aorta. 3. Prominence right paratracheal region unchanged and may be secondary to vasculature or substernal extension of enlarged thyroid gland. 4. Cardiomegaly. Electronically Signed   By: Genia Del M.D.   On: 05/03/2018 09:38   Dg Chest Port 1 View  Result Date: 04/30/2018 CLINICAL DATA:  Fall EXAM: PORTABLE CHEST 1 VIEW COMPARISON:  04/22/2017, CT cervical spine 06/21/2015, radiograph 05/13/2013 FINDINGS: No acute airspace disease or pleural effusion. Mild cardiomegaly with aortic atherosclerosis. No  pneumothorax. Left upper chronic rib fractures. Prominent right paratracheal opacity, likely corresponding to CT demonstrated thyroid masses IMPRESSION: 1. Mild cardiomegaly without acute opacity 2. Similar appearance of right paratracheal mass, presumably corresponding to thyroid mass on previous CT. Electronically Signed   By: Donavan Foil M.D.   On: 04/30/2018 20:45   Dg Knee Complete 4 Views Left  Result Date: 04/30/2018 CLINICAL DATA:  Fall EXAM: LEFT KNEE - COMPLETE 4+ VIEW COMPARISON:  None. FINDINGS: Bones appear osteopenic. Joint space calcifications. No fracture or malalignment. Moderate degenerative changes of the medial and lateral joint space with mild patellofemoral degenerative change. No knee effusion. Vascular calcification IMPRESSION: Degenerative changes without acute osseous abnormality Electronically Signed   By: Donavan Foil M.D.   On: 04/30/2018 20:47   Dg C-arm 1-60 Min  Result Date: 05/01/2018 CLINICAL DATA:  ORIF of an intertrochanteric LEFT femoral neck fracture. EXAM: Operative HIP (WITH OR WITHOUT PELVIS) 2-3V LEFT COMPARISON:  04/30/2018. FINDINGS: 3 spot images from the C-arm fluoroscopic device, AP and LATERAL views of the LEFT hip are submitted for interpretation post-operatively. ORIF of the intertrochanteric femoral neck fracture with intramedullary nail and compression screw  placement. Near anatomic alignment. The radiologic technologist documented 39 seconds of fluoroscopy time period IMPRESSION: Near anatomic alignment post ORIF of the intertrochanteric LEFT femoral neck fracture. Electronically Signed   By: Evangeline Dakin M.D.   On: 05/01/2018 15:07   Dg Hip Unilat With Pelvis 2-3 Views Left  Result Date: 05/01/2018 CLINICAL DATA:  ORIF of an intertrochanteric LEFT femoral neck fracture. EXAM: Operative HIP (WITH OR WITHOUT PELVIS) 2-3V LEFT COMPARISON:  04/30/2018. FINDINGS: 3 spot images from the C-arm fluoroscopic device, AP and LATERAL views of the LEFT hip  are submitted for interpretation post-operatively. ORIF of the intertrochanteric femoral neck fracture with intramedullary nail and compression screw placement. Near anatomic alignment. The radiologic technologist documented 39 seconds of fluoroscopy time period IMPRESSION: Near anatomic alignment post ORIF of the intertrochanteric LEFT femoral neck fracture. Electronically Signed   By: Evangeline Dakin M.D.   On: 05/01/2018 15:07   Dg Femur Portable Min 2 Views Left  Result Date: 04/30/2018 CLINICAL DATA:  Fall EXAM: LEFT FEMUR PORTABLE 2 VIEWS COMPARISON:  None. FINDINGS: Acute slightly comminuted and displaced left intertrochanteric fracture. Mid to distal femur appear intact. Vascular calcifications. IMPRESSION: Acute slightly comminuted and displaced left intertrochanteric fracture Electronically Signed   By: Donavan Foil M.D.   On: 04/30/2018 20:46   Xr Hip Unilat W Or W/o Pelvis 2-3 Views Left  Result Date: 05/20/2018 AP frog-leg x-ray left hip obtained and reviewed.  This shows trochanteric nail with interlock.  Lesser trochanter is a separate fragment good position alignment. Impression: Unchanged postoperative position post fixation of intertrochanteric fracture, comminuted.   Assessment/Plan  #1 history of transitory hypoxia- patient appears to be at her baseline physical exam and oxygen saturations are satisfactory- I suspect transient hypoxia may be more related to nervousness and physical exertion during therapy- however this will have to be monitored closely will write for vital signs pulse ox every shift for every 4 hours x24 hours and then every shift she appears to be doing well and at her baseline currently however--- She does have nebulizers as needed  2.  Diastolic CHF her weight actually appears to be decreasing and edema appears to be slowly improving- continues on Lasix 20 mg a day at this point will monitor.  Will update labs next laboratory data keep an eye on her renal  function and electrolytes she is on Lasix.  3.  History of left hip repair-she appears to have tolerated procedure well she did have postop anemia which appears to have stabilized at 9.2 status post transfusion will have this updated next lab day.  Appears to be controlled at this point with PRN tramadol and Tylenol  4.  History of postop anemia appears to be stabilized status post transfusion-she is on iron will have this updated next laboratory day as well  #5 history of poor appetite apparently this was an issue earlier in the week but apparently she is  tolerating her current diet better according to nursing staff at this point will monitor   [phone]

## 2018-05-31 ENCOUNTER — Encounter (HOSPITAL_COMMUNITY)
Admission: RE | Admit: 2018-05-31 | Discharge: 2018-05-31 | Disposition: A | Discharge status: Home / Self Care | Payer: Medicare HMO | Source: Skilled Nursing Facility | Attending: Internal Medicine | Admitting: Internal Medicine

## 2018-05-31 DIAGNOSIS — Z9181 History of falling: Secondary | ICD-10-CM | POA: Diagnosis not present

## 2018-05-31 DIAGNOSIS — Z9981 Dependence on supplemental oxygen: Secondary | ICD-10-CM | POA: Insufficient documentation

## 2018-05-31 DIAGNOSIS — D649 Anemia, unspecified: Secondary | ICD-10-CM | POA: Diagnosis not present

## 2018-05-31 DIAGNOSIS — Z4789 Encounter for other orthopedic aftercare: Secondary | ICD-10-CM | POA: Insufficient documentation

## 2018-05-31 LAB — CBC WITH DIFFERENTIAL/PLATELET
Abs Immature Granulocytes: 0.03 10*3/uL (ref 0.00–0.07)
Basophils Absolute: 0 10*3/uL (ref 0.0–0.1)
Basophils Relative: 1 %
EOS ABS: 0.2 10*3/uL (ref 0.0–0.5)
Eosinophils Relative: 3 %
HCT: 30.3 % — ABNORMAL LOW (ref 36.0–46.0)
Hemoglobin: 8.9 g/dL — ABNORMAL LOW (ref 12.0–15.0)
Immature Granulocytes: 1 %
Lymphocytes Relative: 30 %
Lymphs Abs: 1.4 10*3/uL (ref 0.7–4.0)
MCH: 30.7 pg (ref 26.0–34.0)
MCHC: 29.4 g/dL — ABNORMAL LOW (ref 30.0–36.0)
MCV: 104.5 fL — ABNORMAL HIGH (ref 80.0–100.0)
Monocytes Absolute: 0.7 10*3/uL (ref 0.1–1.0)
Monocytes Relative: 14 %
Neutro Abs: 2.5 10*3/uL (ref 1.7–7.7)
Neutrophils Relative %: 51 %
Platelets: 159 10*3/uL (ref 150–400)
RBC: 2.9 MIL/uL — ABNORMAL LOW (ref 3.87–5.11)
RDW: 14.5 % (ref 11.5–15.5)
WBC: 4.8 10*3/uL (ref 4.0–10.5)
nRBC: 0 % (ref 0.0–0.2)

## 2018-05-31 LAB — BASIC METABOLIC PANEL
Anion gap: 6 (ref 5–15)
BUN: 26 mg/dL — ABNORMAL HIGH (ref 8–23)
CO2: 36 mmol/L — ABNORMAL HIGH (ref 22–32)
Calcium: 8.3 mg/dL — ABNORMAL LOW (ref 8.9–10.3)
Chloride: 97 mmol/L — ABNORMAL LOW (ref 98–111)
Creatinine, Ser: 0.62 mg/dL (ref 0.44–1.00)
GFR calc Af Amer: >60 mL/min (ref >60)
GFR calc non Af Amer: >60 mL/min (ref >60)
Glucose, Bld: 90 mg/dL (ref 70–99)
Potassium: 4.2 mmol/L (ref 3.5–5.1)
Sodium: 139 mmol/L (ref 135–145)

## 2018-06-03 ENCOUNTER — Ambulatory Visit: Payer: Medicare HMO | Admitting: Podiatry

## 2018-06-07 ENCOUNTER — Other Ambulatory Visit: Payer: Self-pay | Admitting: Adult Health

## 2018-06-07 MED ORDER — TRAMADOL HCL 50 MG PO TABS: 50.0000 mg | ORAL_TABLET | Freq: Four times a day (QID) | ORAL | 0 refills | Status: DC | PRN

## 2018-06-09 ENCOUNTER — Non-Acute Institutional Stay (SKILLED_NURSING_FACILITY): Payer: Medicare HMO | Admitting: Adult Health

## 2018-06-09 ENCOUNTER — Encounter: Payer: Self-pay | Admitting: Adult Health

## 2018-06-09 DIAGNOSIS — I5032 Chronic diastolic (congestive) heart failure: Secondary | ICD-10-CM

## 2018-06-09 NOTE — Progress Notes (Signed)
Location:   [facility] Room Number: 102 P Place of Service:  SNF (31)   CODE STATUS: DNR  Allergies  Allergen Reactions  . Aspirin     Stomach problem  . Codeine     hallucinate  . Penicillins Rash    Has patient had a PCN reaction causing immediate rash, facial/tongue/throat swelling, SOB or lightheadedness with hypotension: Yes Has patient had a PCN reaction causing severe rash involving mucus membranes or skin necrosis: No Has patient had a PCN reaction that required hospitalization No Has patient had a PCN reaction occurring within the last 10 years: No If all of the above answers are "NO", then may proceed with Cephalosporin use.     Chief Complaint  Patient presents with  . Acute Visit    Edema    HPI:  Staff report that she is having worsening edema. She has 3-4 + pitting edema present. She is short of breath with talking. She does have a cough. There are no reports of fevers present. The staff report that she was in her normal state of health yesterday.    Past Medical History:  Diagnosis Date  . GERD (gastroesophageal reflux disease)     Past Surgical History:  Procedure Laterality Date  . APPENDECTOMY     85 yr old  . BREAST LUMPECTOMY    . CATARACT EXTRACTION    . ELBOW SURGERY    . HEMORROIDECTOMY    . HERNIA REPAIR    . INTRAMEDULLARY (IM) NAIL INTERTROCHANTERIC Left 05/01/2018   Procedure: LEFT BIOMET AFFIXUS (IM) SHORT NAIL, INTERTROCHANTRIC FRACTURE;  Surgeon: [name], MD;  Location: [facility];  Service: Orthopedics;  Laterality: Left;  . SHOULDER SURGERY     left rotator cuff  . TONSILLECTOMY AND ADENOIDECTOMY     11 yr  . tubal removal      Social History   Socioeconomic History  . Marital status: Widowed    Spouse name: Not on file  . Number of children: Not on file  . Years of education: Not on file  . Highest education level: Not on file  Occupational History  . Not on file  Social Needs  . Financial  resource strain: Not on file  . Food insecurity:    Worry: Not on file    Inability: Not on file  . Transportation needs:    Medical: Not on file    Non-medical: Not on file  Tobacco Use  . Smoking status: Never Smoker  . Smokeless tobacco: Never Used  Substance and Sexual Activity  . Alcohol use: No  . Drug use: Not on file  . Sexual activity: Not on file  Lifestyle  . Physical activity:    Days per week: Not on file    Minutes per session: Not on file  . Stress: Not on file  Relationships  . Social connections:    Talks on phone: Not on file    Gets together: Not on file    Attends religious service: Not on file    Active member of club or organization: Not on file    Attends meetings of clubs or organizations: Not on file    Relationship status: Not on file  . Intimate partner violence:    Fear of current or ex partner: Not on file    Emotionally abused: Not on file    Physically abused: Not on file    Forced sexual activity: Not on file  Other  Topics Concern  . Not on file  Social History Narrative  . Not on file   Family History  Problem Relation Age of Onset  . Cancer Other   . Diabetes Other       VITAL SIGNS BP (!) 112/57   Pulse 86   Temp 98.2 F (36.8 C)   Resp 20   Ht 5\' 3"  (1.6 m)   Wt 171 lb 6.4 oz (77.7 kg)   SpO2 92% Comment: 2.5 L/min  BMI 30.36 kg/m   Outpatient Encounter Medications as of 06/09/2018  Medication Sig  . acetaminophen (TYLENOL) 500 MG tablet Take 1,000 mg by mouth every 6 (six) hours as needed for moderate pain.  Marland Kitchen albuterol (PROVENTIL) (2.5 MG/3ML) 0.083% nebulizer solution Take 3 mLs (2.5 mg total) by nebulization every 4 (four) hours as needed for wheezing or shortness of breath.  . docusate sodium (COLACE) 100 MG capsule Take 100 mg by mouth 2 (two) times daily.  . ferrous sulfate 325 (65 FE) MG EC tablet Take 325 mg by mouth daily with breakfast.  . furosemide (LASIX) 20 MG tablet Take 20 mg by mouth daily.  Marland Kitchen  GLUCERNA (GLUCERNA) LIQD Take 237 mLs by mouth daily.  . NON FORMULARY Diet Type:  NAS  . omeprazole (PRILOSEC) 20 MG capsule Take 20 mg by mouth daily.   . OXYGEN Inhale 2.5 L/min into the lungs continuous. To keep sats above 90%  . polyethylene glycol (MIRALAX / GLYCOLAX) packet Take 17 g by mouth daily as needed for mild constipation or moderate constipation.  . simethicone (MYLICON) 80 MG chewable tablet Chew 80 mg by mouth every 4 (four) hours as needed for flatulence.  . traMADol (ULTRAM) 50 MG tablet Take 1 tablet (50 mg total) by mouth every 6 (six) hours as needed.   No facility-administered encounter medications on file as of 06/09/2018.      SIGNIFICANT DIAGNOSTIC EXAMS  LABS REVIEWED TODAY:   04-30-18: wbc 6.6; hgb 10.5; hct 33.1; mcv 102.2; plt 150; glucose 163; bun 22; creat 1.20; k+ 4.5; an++ 140; ca 8.8; liver normal albumin 3.4  05-31-18: wbc 4.8; hgb 8.9; hct 30.3; mcv 104.5 plt 159; glucose 90; bun 26; creat 0.62; k+ 4.2; na++ 139; ca 8.3   Review of Systems  Constitutional: Negative for malaise/fatigue.  Cardiovascular: Negative for chest pain.  Gastrointestinal: Negative for abdominal pain.  Musculoskeletal: Negative for back pain and joint pain.  Skin: Negative.   Neurological: Negative for dizziness.  Psychiatric/Behavioral: The patient is not nervous/anxious.     Physical Exam Constitutional:      General: She is not in acute distress.    Appearance: She is well-developed. She is not diaphoretic.  Neck:     Musculoskeletal: Neck supple.     Thyroid: No thyromegaly.  Cardiovascular:     Rate and Rhythm: Normal rate and regular rhythm.     Pulses: Normal pulses.     Heart sounds: Normal heart sounds.  Pulmonary:     Effort: Pulmonary effort is normal. No respiratory distress.     Breath sounds: Normal breath sounds.     Comments: 02 dependent  Is DOE with talking  Abdominal:     General: Bowel sounds are normal. There is no distension.     Palpations:  Abdomen is soft.     Tenderness: There is no abdominal tenderness.  Musculoskeletal:     Right lower leg: Edema present.     Left lower leg: Edema present.  Comments: Has 3+ generalized edema Does not move lower extremities   Lymphadenopathy:     Cervical: No cervical adenopathy.  Skin:    General: Skin is warm and dry.  Neurological:     Mental Status: She is alert.  Psychiatric:        Mood and Affect: Mood normal.       ASSESSMENT/ PLAN:  TODAY;   1. Chronic diastolic heart failure:  EF 55-50% (05-12-13): is worse; will change to lasix 40 mg daily through 06-14-18 then return to 20 mg daily will begin k+ 10 meq daily; in AM will check BMP BNP       MD is aware of resident's narcotic use and is in agreement with current plan of care. We will attempt to wean resident as apropriate   Ok Edwards NP [facility]  Contact [phone] Monday through Friday 8am- 5pm  After hours call [phone]

## 2018-06-12 ENCOUNTER — Encounter: Payer: Self-pay | Admitting: Adult Health

## 2018-06-12 DIAGNOSIS — I5032 Chronic diastolic (congestive) heart failure: Secondary | ICD-10-CM

## 2018-06-12 HISTORY — DX: Chronic diastolic (congestive) heart failure: I50.32

## 2018-06-15 ENCOUNTER — Encounter: Payer: Self-pay | Admitting: Adult Health

## 2018-06-15 ENCOUNTER — Non-Acute Institutional Stay (SKILLED_NURSING_FACILITY): Payer: Medicare HMO | Admitting: Adult Health

## 2018-06-15 ENCOUNTER — Other Ambulatory Visit: Payer: Self-pay | Admitting: Adult Health

## 2018-06-15 DIAGNOSIS — S72002S Fracture of unspecified part of neck of left femur, sequela: Secondary | ICD-10-CM | POA: Diagnosis not present

## 2018-06-15 DIAGNOSIS — M2550 Pain in unspecified joint: Secondary | ICD-10-CM

## 2018-06-15 DIAGNOSIS — G8929 Other chronic pain: Secondary | ICD-10-CM | POA: Diagnosis not present

## 2018-06-15 MED ORDER — TRAMADOL HCL 50 MG PO TABS: 50.0000 mg | ORAL_TABLET | Freq: Four times a day (QID) | ORAL | 0 refills | Status: DC | PRN

## 2018-06-15 NOTE — Progress Notes (Signed)
Location:   [facility] Room Number: 102 P Place of Service:  SNF (31)   CODE STATUS: DNR  Allergies  Allergen Reactions  . Aspirin     Stomach problem  . Codeine     hallucinate  . Penicillins Rash    Has patient had a PCN reaction causing immediate rash, facial/tongue/throat swelling, SOB or lightheadedness with hypotension: Yes Has patient had a PCN reaction causing severe rash involving mucus membranes or skin necrosis: No Has patient had a PCN reaction that required hospitalization No Has patient had a PCN reaction occurring within the last 10 years: No If all of the above answers are "NO", then may proceed with Cephalosporin use.     Chief Complaint  Patient presents with  . Acute Visit    Pain Management    HPI:    Past Medical History:  Diagnosis Date  . Chronic anemia 05/01/2018  . Chronic diastolic heart failure ([place]) 06/12/2018  . CKD (chronic kidney disease), stage III ([place]) 05/01/2018  . Closed fracture of olecranon process of ulna 11/07/2009   Qualifier: Diagnosis of  By: Danne Harbor, Chasity L   . Constipation 05/01/2018  . CTS (carpal tunnel syndrome) 03/04/2011  . GERD (gastroesophageal reflux disease)   . RUPTURE ROTATOR CUFF 03/19/2010   Qualifier: Diagnosis of  By: Aline Brochure MD, Dorothyann Peng      Past Surgical History:  Procedure Laterality Date  . APPENDECTOMY     85 yr old  . BREAST LUMPECTOMY    . CATARACT EXTRACTION    . ELBOW SURGERY    . HEMORROIDECTOMY    . HERNIA REPAIR    . INTRAMEDULLARY (IM) NAIL INTERTROCHANTERIC Left 05/01/2018   Procedure: LEFT BIOMET AFFIXUS (IM) SHORT NAIL, INTERTROCHANTRIC FRACTURE;  Surgeon: [name], MD;  Location: [facility];  Service: Orthopedics;  Laterality: Left;  . SHOULDER SURGERY     left rotator cuff  . TONSILLECTOMY AND ADENOIDECTOMY     11 yr  . tubal removal      Social History   Socioeconomic History  . Marital status: Widowed    Spouse name: Not on file  . Number  of children: Not on file  . Years of education: Not on file  . Highest education level: Not on file  Occupational History  . Not on file  Social Needs  . Financial resource strain: Not on file  . Food insecurity:    Worry: Not on file    Inability: Not on file  . Transportation needs:    Medical: Not on file    Non-medical: Not on file  Tobacco Use  . Smoking status: Never Smoker  . Smokeless tobacco: Never Used  Substance and Sexual Activity  . Alcohol use: No  . Drug use: Not on file  . Sexual activity: Not on file  Lifestyle  . Physical activity:    Days per week: Not on file    Minutes per session: Not on file  . Stress: Not on file  Relationships  . Social connections:    Talks on phone: Not on file    Gets together: Not on file    Attends religious service: Not on file    Active member of club or organization: Not on file    Attends meetings of clubs or organizations: Not on file    Relationship status: Not on file  . Intimate partner violence:    Fear of current or ex partner: Not on file  Emotionally abused: Not on file    Physically abused: Not on file    Forced sexual activity: Not on file  Other Topics Concern  . Not on file  Social History Narrative  . Not on file   Family History  Problem Relation Age of Onset  . Cancer Other   . Diabetes Other       VITAL SIGNS BP 124/76   Pulse 74   Temp (!) 97.4 F (36.3 C)   Resp 20   Ht 5\' 3"  (1.6 m)   Wt 168 lb 12.8 oz (76.6 kg)   BMI 29.90 kg/m   Outpatient Encounter Medications as of 06/15/2018  Medication Sig  . acetaminophen (TYLENOL) 500 MG tablet Take 1,000 mg by mouth every 6 (six) hours as needed for moderate pain.  Marland Kitchen albuterol (PROVENTIL) (2.5 MG/3ML) 0.083% nebulizer solution Take 3 mLs (2.5 mg total) by nebulization every 4 (four) hours as needed for wheezing or shortness of breath.  . docusate sodium (COLACE) 100 MG capsule Take 100 mg by mouth 2 (two) times daily.  . ferrous sulfate  325 (65 FE) MG EC tablet Take 325 mg by mouth daily with breakfast.  . furosemide (LASIX) 20 MG tablet Take 20 mg by mouth daily.  Marland Kitchen GLUCERNA (GLUCERNA) LIQD Take 237 mLs by mouth daily.  . NON FORMULARY Diet Type:  Upgrade diet to D2 / ground and continue with thin liquid  . omeprazole (PRILOSEC) 20 MG capsule Take 20 mg by mouth daily.   . OXYGEN Inhale 2.5 L/min into the lungs continuous. To keep sats above 90%  . polyethylene glycol (MIRALAX / GLYCOLAX) packet Take 17 g by mouth daily as needed for mild constipation or moderate constipation.  . potassium chloride (K-DUR,KLOR-CON) 10 MEQ tablet Take 10 mEq by mouth daily.  . simethicone (MYLICON) 80 MG chewable tablet Chew 80 mg by mouth every 4 (four) hours as needed for flatulence.  . traMADol (ULTRAM) 50 MG tablet Take 1 tablet (50 mg total) by mouth every 6 (six) hours as needed.   No facility-administered encounter medications on file as of 06/15/2018.      SIGNIFICANT DIAGNOSTIC EXAMS  LABS REVIEWED TODAY:   04-30-18: wbc 6.6; hgb 10.5; hct 33.1; mcv 102.2; plt 150; glucose 163; bun 22; creat 1.20; k+ 4.5; an++ 140; ca 8.8; liver normal albumin 3.4  05-31-18: wbc 4.8; hgb 8.9; hct 30.3; mcv 104.5 plt 159; glucose 90; bun 26; creat 0.62; k+ 4.2; na++ 139; ca 8.3   NO NEW LABS.   Review of Systems  Constitutional: Negative for malaise/fatigue.  Respiratory: Positive for shortness of breath. Negative for cough and sputum production.   Cardiovascular: Positive for leg swelling. Negative for chest pain and palpitations.       Is getting  Better   Gastrointestinal: Negative for abdominal pain, constipation and heartburn.  Musculoskeletal: Positive for joint pain. Negative for back pain and myalgias.       All joints hurt   Skin: Negative.   Neurological: Negative for dizziness.  Psychiatric/Behavioral: The patient is not nervous/anxious.     Physical Exam Constitutional:      General: She is not in acute distress.     Appearance: She is well-developed. She is not diaphoretic.  Neck:     Musculoskeletal: Neck supple.     Thyroid: No thyromegaly.  Cardiovascular:     Rate and Rhythm: Normal rate and regular rhythm.     Pulses: Normal pulses.     Heart  sounds: Normal heart sounds.  Pulmonary:     Effort: Pulmonary effort is normal. No respiratory distress.     Breath sounds: Normal breath sounds.     Comments: 02 dependent  Abdominal:     General: Bowel sounds are normal. There is no distension.     Palpations: Abdomen is soft.     Tenderness: There is no abdominal tenderness.  Musculoskeletal:     Right lower leg: Edema present.     Left lower leg: Edema present.     Comments: Is able to move all extremities 2-3+ bilateral lower extremity edema Is improving her skin is wrinkly  Is status post left hip fracture   Lymphadenopathy:     Cervical: No cervical adenopathy.  Skin:    General: Skin is warm and dry.  Neurological:     Mental Status: She is alert. Mental status is at baseline.  Psychiatric:        Mood and Affect: Mood normal.       ASSESSMENT/ PLAN:  TODAY;   1. Closed  Fracture of left hip; sequela 2. Chronic pain of multiple joints  Will begin tylenol 1 gm three times daily  Will continue prn ultram through 06-22-18.    MD is aware of resident's narcotic use and is in agreement with current plan of care. We will attempt to wean resident as apropriate   Ok Edwards NP [facility]  Contact [phone] Monday through Friday 8am- 5pm  After hours call [phone]

## 2018-06-17 ENCOUNTER — Non-Acute Institutional Stay (SKILLED_NURSING_FACILITY): Payer: Medicare HMO | Admitting: Internal Medicine

## 2018-06-17 ENCOUNTER — Encounter: Payer: Self-pay | Admitting: Internal Medicine

## 2018-06-17 ENCOUNTER — Ambulatory Visit (INDEPENDENT_AMBULATORY_CARE_PROVIDER_SITE_OTHER): Payer: Medicare HMO | Admitting: Orthopaedic Surgery

## 2018-06-17 ENCOUNTER — Encounter (INDEPENDENT_AMBULATORY_CARE_PROVIDER_SITE_OTHER): Payer: Self-pay | Admitting: Orthopaedic Surgery

## 2018-06-17 ENCOUNTER — Inpatient Hospital Stay (INDEPENDENT_AMBULATORY_CARE_PROVIDER_SITE_OTHER): Payer: Medicare HMO

## 2018-06-17 VITALS — BP 124/70 | HR 79 | Ht 63.0 in | Wt 159.0 lb

## 2018-06-17 DIAGNOSIS — N183 Chronic kidney disease, stage 3 unspecified: Secondary | ICD-10-CM

## 2018-06-17 DIAGNOSIS — D649 Anemia, unspecified: Secondary | ICD-10-CM

## 2018-06-17 DIAGNOSIS — I5032 Chronic diastolic (congestive) heart failure: Secondary | ICD-10-CM | POA: Diagnosis not present

## 2018-06-17 DIAGNOSIS — M25552 Pain in left hip: Secondary | ICD-10-CM | POA: Diagnosis not present

## 2018-06-17 NOTE — Progress Notes (Signed)
Location:  [facility] Room Number: [place] of Service:  SNF [phone]) Provider:  Darrold Span, MD  Patient Care Team: Celene Squibb, MD as PCP - General (Internal Medicine)  Extended Emergency Contact Information Primary Emergency Contact: Hauser,EllEs Address: [address]          [place], [place] [address] [place] Phone: [phone] Mobile Phone: [phone] Relation: Son Secondary Emergency Contact: Kamesha, Herne Mobile Phone: [phone] Relation: Grandson  Code Status:  DNR Goals of care: Advanced Directive information Advanced Directives 06/17/2018  Does Patient Have a Medical Advance Directive? Yes  Type of Advance Directive Out of facility DNR (pink MOST or yellow form)  Does patient want to make changes to medical advance directive? No - Patient declined  Copy of [place] in Chart? -  Would patient like information on creating a medical advance directive? No - Patient declined  Pre-existing out of facility DNR order (yellow form or pink MOST form) Yellow form placed in chart (order not valid for inpatient use)     Chief Complaint  Patient presents with  . Medical Management of Chronic Issues    Chronic Anemia, CKD Stage III and Hypertension    HPI:  Pt is a 85 y.o. female seen today for medical management of chronic diseases.   Patient is now Long term resident of facility  Patient was admitted in the hospital From 01/10-01/17 For Left Hip Fracture requiring anterior trochanteric nail placement on 1/11 Patient has a history of hypertension, chronic diastolic CHF and GERD who presented to the hospital after she fell at home.  She was found to have left acute intertrochanteric fracture.  She went through surgery on 01/11 She was also found to have dyspnea and hypoxia.  CT did not show any PE.  She was discharged on 2 L of oxygen. Postop she also dropped her hemoglobin to 6.6  requiring 2 units of blood  She was seen by Ortho today and she has healed very well and is WBAT But per nurses she is Total Assist right now. Therapy is not working with her. She denies any SOB or cough. Pain is Controlled She does have increased swelling of her LE. Her weight seems to ba stable at 168-170 lbs Recently her Lasix was increased.    Past Medical History:  Diagnosis Date  . Chronic anemia 05/01/2018  . Chronic diastolic heart failure ([place]) 06/12/2018  . CKD (chronic kidney disease), stage III ([place]) 05/01/2018  . Closed fracture of olecranon process of ulna 11/07/2009   Qualifier: Diagnosis of  By: Danne Harbor, Chasity L   . Constipation 05/01/2018  . CTS (carpal tunnel syndrome) 03/04/2011  . GERD (gastroesophageal reflux disease)   . RUPTURE ROTATOR CUFF 03/19/2010   Qualifier: Diagnosis of  By: Aline Brochure MD, Dorothyann Peng     Past Surgical History:  Procedure Laterality Date  . APPENDECTOMY     85 yr old  . BREAST LUMPECTOMY    . CATARACT EXTRACTION    . ELBOW SURGERY    . HEMORROIDECTOMY    . HERNIA REPAIR    . INTRAMEDULLARY (IM) NAIL INTERTROCHANTERIC Left 05/01/2018   Procedure: LEFT BIOMET AFFIXUS (IM) SHORT NAIL, INTERTROCHANTRIC FRACTURE;  Surgeon: [name], MD;  Location: [facility];  Service: Orthopedics;  Laterality: Left;  . SHOULDER SURGERY     left rotator cuff  . TONSILLECTOMY AND ADENOIDECTOMY     9 yr  .  tubal removal      Allergies  Allergen Reactions  . Aspirin     Stomach problem  . Codeine     hallucinate  . Penicillins Rash    Has patient had a PCN reaction causing immediate rash, facial/tongue/throat swelling, SOB or lightheadedness with hypotension: Yes Has patient had a PCN reaction causing severe rash involving mucus membranes or skin necrosis: No Has patient had a PCN reaction that required hospitalization No Has patient had a PCN reaction occurring within the last 10 years: No If all of the above answers are "NO", then may proceed  with Cephalosporin use.     Outpatient Encounter Medications as of 06/17/2018  Medication Sig  . acetaminophen (TYLENOL) 500 MG tablet Take 1,000 mg by mouth every 8 (eight) hours.   Marland Kitchen albuterol (PROVENTIL) (2.5 MG/3ML) 0.083% nebulizer solution Take 3 mLs (2.5 mg total) by nebulization every 4 (four) hours as needed for wheezing or shortness of breath.  . docusate sodium (COLACE) 100 MG capsule Take 100 mg by mouth 2 (two) times daily.  . ferrous sulfate 325 (65 FE) MG EC tablet Take 325 mg by mouth daily with breakfast.  . furosemide (LASIX) 20 MG tablet Take 20 mg by mouth daily.  Marland Kitchen GLUCERNA (GLUCERNA) LIQD Take 237 mLs by mouth daily.  . NON FORMULARY Diet Type:  Upgrade diet to D2 / ground and continue with thin liquid  . omeprazole (PRILOSEC) 20 MG capsule Take 20 mg by mouth daily.   . OXYGEN Inhale 2 L/min into the lungs continuous. To keep sats above 90%   . polyethylene glycol (MIRALAX / GLYCOLAX) packet Take 17 g by mouth daily as needed for mild constipation or moderate constipation.  . potassium chloride (K-DUR,KLOR-CON) 10 MEQ tablet Take 10 mEq by mouth daily.  . simethicone (MYLICON) 80 MG chewable tablet Chew 80 mg by mouth every 4 (four) hours as needed for flatulence.  . traMADol (ULTRAM) 50 MG tablet Take 1 tablet (50 mg total) by mouth every 6 (six) hours as needed for up to 7 days.   No facility-administered encounter medications on file as of 06/17/2018.     Review of Systems  Constitutional: Positive for activity change and appetite change.  HENT: Negative.   Respiratory: Negative.   Cardiovascular: Positive for leg swelling.  Gastrointestinal: Negative.   Genitourinary: Negative.   Musculoskeletal: Positive for arthralgias and myalgias.  Skin: Negative.   Neurological: Positive for weakness.  Psychiatric/Behavioral: Negative.   All other systems reviewed and are negative.   Immunization History  Administered Date(s) Administered  . Influenza, High Dose  Seasonal PF 01/21/2018  . Tdap 12/10/2012   Pertinent  Health Maintenance Due  Topic Date Due  . PNA vac Low Risk Adult (1 of 2 - PCV13) 07/08/2018 (Originally 03/15/1976)  . INFLUENZA VACCINE  Completed  . DEXA SCAN  Completed   No flowsheet data found. Functional Status Survey:    Vitals:   06/17/18 0811  BP: (!) 110/56  Pulse: 81  Resp: 20  Temp: 98 F (36.7 C)  Weight: 170 lb 9.6 oz (77.4 kg)  Height: 5\' 3"  (1.6 m)   Body mass index is 30.22 kg/m. Physical Exam Vitals signs and nursing note reviewed.  Constitutional:      Appearance: Normal appearance.  HENT:     Head: Normocephalic.     Nose: Nose normal.     Mouth/Throat:     Mouth: Mucous membranes are moist.     Pharynx: Oropharynx is  clear.  Eyes:     Pupils: Pupils are equal, round, and reactive to light.  Neck:     Musculoskeletal: Neck supple.  Cardiovascular:     Rate and Rhythm: Normal rate and regular rhythm.     Pulses: Normal pulses.     Heart sounds: Normal heart sounds.  Pulmonary:     Effort: Pulmonary effort is normal. No respiratory distress.     Breath sounds: Normal breath sounds. No wheezing or rales.  Abdominal:     General: Abdomen is flat. Bowel sounds are normal. There is no distension.     Palpations: Abdomen is soft.     Tenderness: There is no abdominal tenderness. There is no guarding.  Musculoskeletal:     Comments: Moderate Swelling Bilateral  Skin:    General: Skin is warm and dry.  Neurological:     General: No focal deficit present.     Mental Status: She is alert and oriented to person, place, and time.  Psychiatric:        Mood and Affect: Mood normal.        Thought Content: Thought content normal.        Judgment: Judgment normal.     Labs reviewed: Recent Labs    05/06/18 0935 05/13/18 0715 05/31/18 0700  NA 142 139 139  K 4.5 4.7 4.2  CL 108 104 97*  CO2 26 31 36*  GLUCOSE 109* 95 90  BUN 52* 28* 26*  CREATININE 1.01* 0.75 0.62  CALCIUM 8.4* 8.4*  8.3*   Recent Labs    04/30/18 2003  AST 17  ALT 10  ALKPHOS 51  BILITOT 0.7  PROT 5.9*  ALBUMIN 3.4*   Recent Labs    05/02/18 0448 05/03/18 0219  05/06/18 0935 05/13/18 0715 05/31/18 0700  WBC 8.3 10.4   < > 9.7 7.2 4.8  NEUTROABS 5.7 7.7  --   --   --  2.5  HGB 7.1* 6.6*   < > 9.5* 9.2* 8.9*  HCT 22.2* 21.2*   < > 30.5* 31.7* 30.3*  MCV 101.4* 101.4*   < > 98.4 106.0* 104.5*  PLT 98* 99*   < > 153 183 159   < > = values in this interval not displayed.   No results found for: TSH No results found for: HGBA1C No results found for: CHOL, HDL, LDLCALC, LDLDIRECT, TRIG, CHOLHDL  Significant Diagnostic Results in last 30 days:  Xr Hip Unilat W Or W/o Pelvis 2-3 Views Left  Result Date: 05/20/2018 AP frog-leg x-ray left hip obtained and reviewed.  This shows trochanteric nail with interlock.  Lesser trochanter is a separate fragment good position alignment. Impression: Unchanged postoperative position post fixation of intertrochanteric fracture, comminuted.   Assessment/Plan Closed fracture of left hip S/P ORIF on 01/11 Stays Full Assist Pain is controlled on PRN Ultram Per Ortho fracture is healed  She is WBAT Will d/w therapy about reassessing her  Chronic diastolic congestive heart failure with LE edema Will increase her lasix to 40 mg Compression stockings Hypoxia On Oxygen CT scan of Chest was negative CKD (chronic kidney disease), stage III  BUN and Creat stable Will repeat BMP Chronic anemia Mildly Low Hgb On iron Check B12   ACP Have D/w the Grandson They want her to be DNR but want everything else for now.    Family/ staff Communication:   Labs/tests ordered: BMP in 1 week with Vit B12

## 2018-06-17 NOTE — Progress Notes (Signed)
   Post-Op Visit Note   Patient: Hannah Arias           Date of Birth: 1911-01-14           MRN: [id number] Visit Date: 06/17/2018 PCP: Celene Squibb, MD   Assessment & Plan: X-rays look good she is only been able to stand and take couple steps.  Chief Complaint:  Chief Complaint  Patient presents with  . Left Hip - Follow-up    05/01/2018  ORIF Left Hip   Visit Diagnoses:  1. Pain in left hip     Plan: Okay for weightbearing as tolerated unfortunately on 105 she has weakness lower extremity edema they bumped her Lasix up, and she has more swelling of her feet ankles.  Her hip is nicely healed and she can now weight-bear as tolerated.  Return PRN.  Follow-Up Instructions: No follow-ups on file.   Orders:  Orders Placed This Encounter  Procedures  . XR HIP UNILAT W OR W/O PELVIS 2-3 VIEWS LEFT   No orders of the defined types were placed in this encounter.   Imaging: No results found.  PMFS History: Patient Active Problem List   Diagnosis Date Noted  . Chronic diastolic heart failure ([place]) 06/12/2018  . Hypertension 05/10/2018  . Acute blood loss anemia 05/03/2018  . AKI (acute kidney injury) ([place]) 05/01/2018  . CKD (chronic kidney disease), stage III ([place]) 05/01/2018  . Abdominal hernia 05/01/2018  . Constipation 05/01/2018  . Chronic anemia 05/01/2018  . Hip fracture ([place]) 04/30/2018  . Hypoxia 05/11/2013   Past Medical History:  Diagnosis Date  . Chronic anemia 05/01/2018  . Chronic diastolic heart failure ([place]) 06/12/2018  . CKD (chronic kidney disease), stage III ([place]) 05/01/2018  . Closed fracture of olecranon process of ulna 11/07/2009   Qualifier: Diagnosis of  By: Danne Harbor, Chasity L   . Constipation 05/01/2018  . CTS (carpal tunnel syndrome) 03/04/2011  . GERD (gastroesophageal reflux disease)   . RUPTURE ROTATOR CUFF 03/19/2010   Qualifier: Diagnosis of  By: Aline Brochure MD, Dorothyann Peng      Family History  Problem Relation Age of Onset  . Cancer Other    . Diabetes Other     Past Surgical History:  Procedure Laterality Date  . APPENDECTOMY     85 yr old  . BREAST LUMPECTOMY    . CATARACT EXTRACTION    . ELBOW SURGERY    . HEMORROIDECTOMY    . HERNIA REPAIR    . INTRAMEDULLARY (IM) NAIL INTERTROCHANTERIC Left 05/01/2018   Procedure: LEFT BIOMET AFFIXUS (IM) SHORT NAIL, INTERTROCHANTRIC FRACTURE;  Surgeon: [name], MD;  Location: [facility];  Service: Orthopedics;  Laterality: Left;  . SHOULDER SURGERY     left rotator cuff  . TONSILLECTOMY AND ADENOIDECTOMY     11 yr  . tubal removal     Social History   Occupational History  . Not on file  Tobacco Use  . Smoking status: Never Smoker  . Smokeless tobacco: Never Used  Substance and Sexual Activity  . Alcohol use: No  . Drug use: Not on file  . Sexual activity: Not on file

## 2018-06-18 ENCOUNTER — Other Ambulatory Visit (HOSPITAL_COMMUNITY)
Admission: RE | Admit: 2018-06-18 | Discharge: 2018-06-18 | Disposition: A | Discharge status: Home / Self Care | Payer: Medicare HMO | Source: Skilled Nursing Facility | Attending: Internal Medicine | Admitting: Internal Medicine

## 2018-06-18 DIAGNOSIS — Z20828 Contact with and (suspected) exposure to other viral communicable diseases: Secondary | ICD-10-CM | POA: Diagnosis not present

## 2018-06-18 DIAGNOSIS — A492 Hemophilus influenzae infection, unspecified site: Secondary | ICD-10-CM | POA: Diagnosis not present

## 2018-06-18 LAB — INFLUENZA PANEL BY PCR (TYPE A & B)
Influenza A By PCR: NEGATIVE
Influenza B By PCR: NEGATIVE

## 2018-06-20 ENCOUNTER — Encounter (HOSPITAL_COMMUNITY): Payer: Self-pay | Admitting: Emergency Medicine

## 2018-06-20 ENCOUNTER — Emergency Department (HOSPITAL_COMMUNITY): Payer: Medicare HMO

## 2018-06-20 ENCOUNTER — Inpatient Hospital Stay (HOSPITAL_COMMUNITY): Payer: Medicare HMO

## 2018-06-20 ENCOUNTER — Inpatient Hospital Stay (HOSPITAL_COMMUNITY)
Admission: EM | Admit: 2018-06-20 | Discharge: 2018-06-28 | DRG: 291 | Disposition: A | Discharge status: Skilled Nursing Facility | Payer: Medicare HMO | Source: Skilled Nursing Facility | Attending: Internal Medicine | Admitting: Internal Medicine

## 2018-06-20 ENCOUNTER — Other Ambulatory Visit: Payer: Self-pay

## 2018-06-20 DIAGNOSIS — K219 Gastro-esophageal reflux disease without esophagitis: Secondary | ICD-10-CM | POA: Diagnosis not present

## 2018-06-20 DIAGNOSIS — E876 Hypokalemia: Secondary | ICD-10-CM | POA: Diagnosis not present

## 2018-06-20 DIAGNOSIS — H9190 Unspecified hearing loss, unspecified ear: Secondary | ICD-10-CM | POA: Diagnosis present

## 2018-06-20 DIAGNOSIS — S72002D Fracture of unspecified part of neck of left femur, subsequent encounter for closed fracture with routine healing: Secondary | ICD-10-CM | POA: Diagnosis not present

## 2018-06-20 DIAGNOSIS — Z833 Family history of diabetes mellitus: Secondary | ICD-10-CM

## 2018-06-20 DIAGNOSIS — R7989 Other specified abnormal findings of blood chemistry: Secondary | ICD-10-CM | POA: Diagnosis not present

## 2018-06-20 DIAGNOSIS — Z515 Encounter for palliative care: Secondary | ICD-10-CM | POA: Diagnosis not present

## 2018-06-20 DIAGNOSIS — N183 Chronic kidney disease, stage 3 (moderate): Secondary | ICD-10-CM | POA: Diagnosis not present

## 2018-06-20 DIAGNOSIS — Z886 Allergy status to analgesic agent status: Secondary | ICD-10-CM

## 2018-06-20 DIAGNOSIS — Z789 Other specified health status: Secondary | ICD-10-CM | POA: Diagnosis not present

## 2018-06-20 DIAGNOSIS — J181 Lobar pneumonia, unspecified organism: Secondary | ICD-10-CM | POA: Diagnosis not present

## 2018-06-20 DIAGNOSIS — Z880 Allergy status to penicillin: Secondary | ICD-10-CM

## 2018-06-20 DIAGNOSIS — R0602 Shortness of breath: Secondary | ICD-10-CM

## 2018-06-20 DIAGNOSIS — Z79899 Other long term (current) drug therapy: Secondary | ICD-10-CM

## 2018-06-20 DIAGNOSIS — Z9981 Dependence on supplemental oxygen: Secondary | ICD-10-CM

## 2018-06-20 DIAGNOSIS — Y95 Nosocomial condition: Secondary | ICD-10-CM | POA: Diagnosis not present

## 2018-06-20 DIAGNOSIS — J9611 Chronic respiratory failure with hypoxia: Secondary | ICD-10-CM | POA: Diagnosis not present

## 2018-06-20 DIAGNOSIS — J9801 Acute bronchospasm: Secondary | ICD-10-CM | POA: Diagnosis present

## 2018-06-20 DIAGNOSIS — J9621 Acute and chronic respiratory failure with hypoxia: Secondary | ICD-10-CM

## 2018-06-20 DIAGNOSIS — Z7189 Other specified counseling: Secondary | ICD-10-CM | POA: Diagnosis not present

## 2018-06-20 DIAGNOSIS — D631 Anemia in chronic kidney disease: Secondary | ICD-10-CM | POA: Diagnosis present

## 2018-06-20 DIAGNOSIS — J189 Pneumonia, unspecified organism: Secondary | ICD-10-CM | POA: Diagnosis not present

## 2018-06-20 DIAGNOSIS — J90 Pleural effusion, not elsewhere classified: Secondary | ICD-10-CM | POA: Diagnosis not present

## 2018-06-20 DIAGNOSIS — M2550 Pain in unspecified joint: Secondary | ICD-10-CM

## 2018-06-20 DIAGNOSIS — Z66 Do not resuscitate: Secondary | ICD-10-CM | POA: Diagnosis present

## 2018-06-20 DIAGNOSIS — I5033 Acute on chronic diastolic (congestive) heart failure: Secondary | ICD-10-CM | POA: Diagnosis not present

## 2018-06-20 DIAGNOSIS — I130 Hypertensive heart and chronic kidney disease with heart failure and stage 1 through stage 4 chronic kidney disease, or unspecified chronic kidney disease: Secondary | ICD-10-CM | POA: Diagnosis not present

## 2018-06-20 DIAGNOSIS — J811 Chronic pulmonary edema: Secondary | ICD-10-CM

## 2018-06-20 DIAGNOSIS — I248 Other forms of acute ischemic heart disease: Secondary | ICD-10-CM | POA: Diagnosis present

## 2018-06-20 DIAGNOSIS — R778 Other specified abnormalities of plasma proteins: Secondary | ICD-10-CM

## 2018-06-20 DIAGNOSIS — J810 Acute pulmonary edema: Secondary | ICD-10-CM | POA: Diagnosis not present

## 2018-06-20 DIAGNOSIS — D696 Thrombocytopenia, unspecified: Secondary | ICD-10-CM

## 2018-06-20 DIAGNOSIS — W1830XD Fall on same level, unspecified, subsequent encounter: Secondary | ICD-10-CM | POA: Diagnosis not present

## 2018-06-20 DIAGNOSIS — Z885 Allergy status to narcotic agent status: Secondary | ICD-10-CM | POA: Diagnosis not present

## 2018-06-20 DIAGNOSIS — G8929 Other chronic pain: Secondary | ICD-10-CM | POA: Insufficient documentation

## 2018-06-20 LAB — CBC WITH DIFFERENTIAL/PLATELET
Abs Immature Granulocytes: 0.04 10*3/uL (ref 0.00–0.07)
Basophils Absolute: 0 10*3/uL (ref 0.0–0.1)
Basophils Relative: 1 %
EOS PCT: 0 %
Eosinophils Absolute: 0 10*3/uL (ref 0.0–0.5)
HEMATOCRIT: 31.3 % — AB (ref 36.0–46.0)
Hemoglobin: 9.2 g/dL — ABNORMAL LOW (ref 12.0–15.0)
Immature Granulocytes: 1 %
LYMPHS PCT: 22 %
Lymphs Abs: 1.5 10*3/uL (ref 0.7–4.0)
MCH: 30.7 pg (ref 26.0–34.0)
MCHC: 29.4 g/dL — ABNORMAL LOW (ref 30.0–36.0)
MCV: 104.3 fL — ABNORMAL HIGH (ref 80.0–100.0)
MONOS PCT: 7 %
Monocytes Absolute: 0.5 10*3/uL (ref 0.1–1.0)
Neutro Abs: 5 10*3/uL (ref 1.7–7.7)
Neutrophils Relative %: 69 %
Platelets: 163 10*3/uL (ref 150–400)
RBC: 3 MIL/uL — ABNORMAL LOW (ref 3.87–5.11)
RDW: 14.9 % (ref 11.5–15.5)
WBC: 7.1 10*3/uL (ref 4.0–10.5)
nRBC: 0 % (ref 0.0–0.2)

## 2018-06-20 LAB — TROPONIN I
Troponin I: 0.15 ng/mL (ref <0.03)
Troponin I: 0.14 ng/mL (ref <0.03)
Troponin I: 0.11 ng/mL (ref <0.03)

## 2018-06-20 LAB — COMPREHENSIVE METABOLIC PANEL
ALBUMIN: 2.7 g/dL — AB (ref 3.5–5.0)
ALT: 10 U/L (ref 0–44)
AST: 17 U/L (ref 15–41)
Alkaline Phosphatase: 57 U/L (ref 38–126)
Anion gap: 7 (ref 5–15)
BILIRUBIN TOTAL: 0.8 mg/dL (ref 0.3–1.2)
BUN: 24 mg/dL — ABNORMAL HIGH (ref 8–23)
CO2: 36 mmol/L — ABNORMAL HIGH (ref 22–32)
Calcium: 8.6 mg/dL — ABNORMAL LOW (ref 8.9–10.3)
Chloride: 100 mmol/L (ref 98–111)
Creatinine, Ser: 0.58 mg/dL (ref 0.44–1.00)
GFR calc Af Amer: >60 mL/min (ref >60)
GFR calc non Af Amer: >60 mL/min (ref >60)
Glucose, Bld: 125 mg/dL — ABNORMAL HIGH (ref 70–99)
Potassium: 4 mmol/L (ref 3.5–5.1)
Sodium: 143 mmol/L (ref 135–145)
TOTAL PROTEIN: 5.7 g/dL — AB (ref 6.5–8.1)

## 2018-06-20 LAB — ECHOCARDIOGRAM COMPLETE
Height: 63 in
Weight: 2544 oz

## 2018-06-20 LAB — MRSA PCR SCREENING: MRSA by PCR: NEGATIVE

## 2018-06-20 LAB — PROCALCITONIN: Procalcitonin: <0.1 ng/mL

## 2018-06-20 LAB — BRAIN NATRIURETIC PEPTIDE: B Natriuretic Peptide: 434 pg/mL — ABNORMAL HIGH (ref 0.0–100.0)

## 2018-06-20 MED ORDER — SODIUM CHLORIDE 0.9% FLUSH
3.0000 mL | Freq: Two times a day (BID) | INTRAVENOUS | Status: DC
  Administered 2018-06-20 – 2018-06-28 (×16): 3 mL via INTRAVENOUS

## 2018-06-20 MED ORDER — ACETAMINOPHEN 325 MG PO TABS
650.0000 mg | ORAL_TABLET | ORAL | Status: DC | PRN
  Administered 2018-06-20: 650 mg via ORAL
  Filled 2018-06-20: qty 2

## 2018-06-20 MED ORDER — PANTOPRAZOLE SODIUM 40 MG PO TBEC
40.0000 mg | DELAYED_RELEASE_TABLET | Freq: Every day | ORAL | Status: DC
  Administered 2018-06-20 – 2018-06-28 (×9): 40 mg via ORAL
  Filled 2018-06-20 (×9): qty 1

## 2018-06-20 MED ORDER — TRAMADOL HCL 50 MG PO TABS: 50.0000 mg | ORAL_TABLET | Freq: Four times a day (QID) | ORAL | Status: DC | PRN

## 2018-06-20 MED ORDER — DOCUSATE SODIUM 100 MG PO CAPS
100.0000 mg | ORAL_CAPSULE | Freq: Two times a day (BID) | ORAL | Status: DC
  Administered 2018-06-20 – 2018-06-28 (×17): 100 mg via ORAL
  Filled 2018-06-20 (×17): qty 1

## 2018-06-20 MED ORDER — ORAL CARE MOUTH RINSE
15.0000 mL | Freq: Two times a day (BID) | OROMUCOSAL | Status: DC
  Administered 2018-06-21 – 2018-06-28 (×13): 15 mL via OROMUCOSAL

## 2018-06-20 MED ORDER — FERROUS SULFATE 325 (65 FE) MG PO TABS
325.0000 mg | ORAL_TABLET | Freq: Every day | ORAL | Status: DC
  Administered 2018-06-21 – 2018-06-28 (×8): 325 mg via ORAL
  Filled 2018-06-20 (×9): qty 1

## 2018-06-20 MED ORDER — SODIUM CHLORIDE 0.9 % IV SOLN
1.0000 g | Freq: Once | INTRAVENOUS | Status: AC
  Administered 2018-06-20: 1 g via INTRAVENOUS
  Filled 2018-06-20: qty 1

## 2018-06-20 MED ORDER — VANCOMYCIN HCL IN DEXTROSE 750-5 MG/150ML-% IV SOLN: 750.0000 mg | INTRAVENOUS | Status: DC

## 2018-06-20 MED ORDER — FUROSEMIDE 10 MG/ML IJ SOLN
40.0000 mg | Freq: Two times a day (BID) | INTRAMUSCULAR | Status: DC
  Administered 2018-06-20 – 2018-06-26 (×12): 40 mg via INTRAVENOUS
  Filled 2018-06-20 (×12): qty 4

## 2018-06-20 MED ORDER — ENOXAPARIN SODIUM 40 MG/0.4ML ~~LOC~~ SOLN
40.0000 mg | SUBCUTANEOUS | Status: DC
  Administered 2018-06-20 – 2018-06-21 (×2): 40 mg via SUBCUTANEOUS
  Filled 2018-06-20 (×2): qty 0.4

## 2018-06-20 MED ORDER — SODIUM CHLORIDE 0.9% FLUSH: 3.0000 mL | INTRAVENOUS | Status: DC | PRN

## 2018-06-20 MED ORDER — VANCOMYCIN HCL 10 G IV SOLR: 1500.0000 mg | Freq: Once | INTRAVENOUS | Status: DC

## 2018-06-20 MED ORDER — IPRATROPIUM-ALBUTEROL 0.5-2.5 (3) MG/3ML IN SOLN
3.0000 mL | Freq: Four times a day (QID) | RESPIRATORY_TRACT | Status: DC
  Administered 2018-06-20 – 2018-06-25 (×19): 3 mL via RESPIRATORY_TRACT
  Filled 2018-06-20 (×18): qty 3

## 2018-06-20 MED ORDER — POLYETHYLENE GLYCOL 3350 17 G PO PACK: 17.0000 g | PACK | Freq: Every day | ORAL | Status: DC | PRN

## 2018-06-20 MED ORDER — SIMETHICONE 80 MG PO CHEW: 80.0000 mg | CHEWABLE_TABLET | ORAL | Status: DC | PRN

## 2018-06-20 MED ORDER — FUROSEMIDE 10 MG/ML IJ SOLN
40.0000 mg | Freq: Once | INTRAMUSCULAR | Status: AC
  Administered 2018-06-20: 40 mg via INTRAVENOUS
  Filled 2018-06-20: qty 4

## 2018-06-20 MED ORDER — SODIUM CHLORIDE 0.9 % IV SOLN: 250.0000 mL | INTRAVENOUS | Status: DC | PRN

## 2018-06-20 MED ORDER — GLUCERNA PO LIQD
237.0000 mL | Freq: Every day | ORAL | Status: DC
  Administered 2018-06-20 – 2018-06-28 (×8): 237 mL via ORAL
  Filled 2018-06-20 (×10): qty 237

## 2018-06-20 MED ORDER — VANCOMYCIN HCL 10 G IV SOLR
1500.0000 mg | Freq: Once | INTRAVENOUS | Status: AC
  Administered 2018-06-20: 1500 mg via INTRAVENOUS
  Filled 2018-06-20: qty 1500

## 2018-06-20 MED ORDER — ONDANSETRON HCL 4 MG/2ML IJ SOLN
4.0000 mg | Freq: Four times a day (QID) | INTRAMUSCULAR | Status: DC | PRN
  Administered 2018-06-22: 4 mg via INTRAVENOUS
  Filled 2018-06-20: qty 2

## 2018-06-20 MED ORDER — SODIUM CHLORIDE 0.9 % IV SOLN
1.0000 g | INTRAVENOUS | Status: DC
  Administered 2018-06-21 – 2018-06-23 (×3): 1 g via INTRAVENOUS
  Filled 2018-06-20 (×6): qty 1

## 2018-06-20 NOTE — ED Notes (Signed)
Respiratory made aware of nebulizer treatment due.

## 2018-06-20 NOTE — ED Notes (Signed)
Respiratory contacted for nebulizer treatment. ° °

## 2018-06-20 NOTE — ED Provider Notes (Signed)
[facility] EMERGENCY DEPARTMENT Provider Note   CSN: [id number] Arrival date & time: 06/20/18  0827    History   Chief Complaint Chief Complaint  Patient presents with  . Shortness of Breath    HPI Hannah Arias is a 85 y.o. female.     HPI Patient presents with shortness of breath.  Has had for around the last week.  Has been treated with albuterol at the nursing home.  Occasional cough.  Patient is hard of hearing and somewhat difficult to get history from.  No chest pain.  Does have swelling in her legs.  No bleeding.  She is normally on chronic oxygen at 3 L.  No abdominal pain.  No fevers.  No sputum production with her cough. Past Medical History:  Diagnosis Date  . Chronic anemia 05/01/2018  . Chronic diastolic heart failure ([place]) 06/12/2018  . CKD (chronic kidney disease), stage III ([place]) 05/01/2018  . Closed fracture of olecranon process of ulna 11/07/2009   Qualifier: Diagnosis of  By: Danne Harbor, Chasity L   . Constipation 05/01/2018  . CTS (carpal tunnel syndrome) 03/04/2011  . GERD (gastroesophageal reflux disease)   . RUPTURE ROTATOR CUFF 03/19/2010   Qualifier: Diagnosis of  By: Aline Brochure MD, Dorothyann Peng      Patient Active Problem List   Diagnosis Date Noted  . Chronic diastolic heart failure ([place]) 06/12/2018  . Hypertension 05/10/2018  . Acute blood loss anemia 05/03/2018  . AKI (acute kidney injury) ([place]) 05/01/2018  . CKD (chronic kidney disease), stage III ([place]) 05/01/2018  . Abdominal hernia 05/01/2018  . Constipation 05/01/2018  . Chronic anemia 05/01/2018  . Hip fracture ([place]) 04/30/2018  . Hypoxia 05/11/2013    Past Surgical History:  Procedure Laterality Date  . APPENDECTOMY     85 yr old  . BREAST LUMPECTOMY    . CATARACT EXTRACTION    . ELBOW SURGERY    . HEMORROIDECTOMY    . HERNIA REPAIR    . INTRAMEDULLARY (IM) NAIL INTERTROCHANTERIC Left 05/01/2018   Procedure: LEFT BIOMET AFFIXUS (IM) SHORT NAIL, INTERTROCHANTRIC FRACTURE;  Surgeon:  [name], MD;  Location: [facility];  Service: Orthopedics;  Laterality: Left;  . SHOULDER SURGERY     left rotator cuff  . TONSILLECTOMY AND ADENOIDECTOMY     11 yr  . tubal removal       OB History   No obstetric history on file.      Home Medications    Prior to Admission medications   Medication Sig Start Date End Date Taking? Authorizing Provider  acetaminophen (TYLENOL) 500 MG tablet Take 1,000 mg by mouth every 8 (eight) hours.  06/15/18   [provider]  albuterol (PROVENTIL) (2.5 MG/3ML) 0.083% nebulizer solution Take 3 mLs (2.5 mg total) by nebulization every 4 (four) hours as needed for wheezing or shortness of breath. 05/15/13   Hosie Poisson, MD  docusate sodium (COLACE) 100 MG capsule Take 100 mg by mouth 2 (two) times daily.    [provider]  ferrous sulfate 325 (65 FE) MG EC tablet Take 325 mg by mouth daily with breakfast.    [provider]  furosemide (LASIX) 20 MG tablet Take 20 mg by mouth daily.    [provider]  GLUCERNA (GLUCERNA) LIQD Take 237 mLs by mouth daily. 05/28/18   [provider]  NON FORMULARY Diet Type:  Upgrade diet to D2 / ground and continue with thin liquid 05/25/18   [provider]  omeprazole (PRILOSEC) 20 MG capsule Take 20 mg by mouth daily.  07/22/10   [provider]  OXYGEN Inhale 2 L/min into the lungs continuous. To keep sats above 90%     [provider]  polyethylene glycol (MIRALAX / GLYCOLAX) packet Take 17 g by mouth daily as needed for mild constipation or moderate constipation. 05/07/18   Rai, Ripudeep K, MD  potassium chloride (K-DUR,KLOR-CON) 10 MEQ tablet Take 10 mEq by mouth daily. 06/10/18   [provider]  simethicone (MYLICON) 80 MG chewable tablet Chew 80 mg by mouth every 4 (four) hours as needed for flatulence.    [provider]  traMADol (ULTRAM) 50 MG tablet Take 1 tablet (50 mg total) by mouth every 6 (six) hours as needed for up to  7 days. 06/15/18 06/22/18  Gerlene Fee, NP    Family History Family History  Problem Relation Age of Onset  . Cancer Other   . Diabetes Other     Social History Social History   Tobacco Use  . Smoking status: Never Smoker  . Smokeless tobacco: Never Used  Substance Use Topics  . Alcohol use: No  . Drug use: Not on file     Allergies   Aspirin; Codeine; and Penicillins   Review of Systems Review of Systems  Constitutional: Negative for appetite change.  HENT: Negative for congestion.   Respiratory: Positive for cough and shortness of breath.   Cardiovascular: Positive for leg swelling. Negative for chest pain.  Gastrointestinal: Negative for abdominal pain.  Genitourinary: Negative for flank pain.  Musculoskeletal: Negative for back pain.  Skin: Negative for rash.  Neurological: Negative for weakness.  Psychiatric/Behavioral: Negative for confusion.     Physical Exam Updated Vital Signs BP 128/61   Pulse 86   Temp 98.1 F (36.7 C) (Oral)   Resp (!) 26   Ht 5\' 3"  (1.6 m)   Wt 72.1 kg   SpO2 92%   BMI 28.17 kg/m   Physical Exam HENT:     Head: Normocephalic.  Neck:     Musculoskeletal: Neck supple.     Vascular: No JVD.  Cardiovascular:     Rate and Rhythm: Normal rate and regular rhythm.  Pulmonary:     Comments: Diffuse wheezes and harsh breath sounds. Chest:     Chest wall: No tenderness.  Musculoskeletal:     Right lower leg: Edema present.     Left lower leg: Edema present.     Comments: Moderate to severe pitting edema bilateral lower legs.  Skin:    General: Skin is warm.     Capillary Refill: Capillary refill takes less than 2 seconds.  Neurological:     Mental Status: She is alert.     Comments: Patient is at her reported baseline.      ED Treatments / Results  Labs (all labs ordered are listed, but only abnormal results are displayed) Labs Reviewed  BRAIN NATRIURETIC PEPTIDE - Abnormal; Notable for the following components:        Result Value   B Natriuretic Peptide 434.0 (*)    All other components within normal limits  TROPONIN I - Abnormal; Notable for the following components:   Troponin I 0.15 (*)    All other components within normal limits  CBC WITH DIFFERENTIAL/PLATELET - Abnormal; Notable for the following components:   RBC 3.00 (*)    Hemoglobin 9.2 (*)    HCT 31.3 (*)    MCV 104.3 (*)  MCHC 29.4 (*)    All other components within normal limits  COMPREHENSIVE METABOLIC PANEL - Abnormal; Notable for the following components:   CO2 36 (*)    Glucose, Bld 125 (*)    BUN 24 (*)    Calcium 8.6 (*)    Total Protein 5.7 (*)    Albumin 2.7 (*)    All other components within normal limits    EKG EKG Interpretation  Date/Time:  Sunday June 20 2018 08:29:11 EST Ventricular Rate:  82 PR Interval:    QRS Duration: 94 QT Interval:  373 QTC Calculation: 436 R Axis:   71 Text Interpretation:  Sinus rhythm Probable anteroseptal infarct, old Confirmed by Davonna Belling [phone]) on 06/20/2018 9:07:48 AM   Radiology Dg Chest 2 View  Result Date: 06/20/2018 CLINICAL DATA:  Shortness of breath and productive cough for 1 week. EXAM: CHEST - 2 VIEW COMPARISON:  05/03/2018 FINDINGS: Stable mild cardiomegaly and ectasia of the thoracic aorta. Aortic atherosclerosis. New right lower lobe opacity and small right pleural effusion, suspicious for pneumonia. Left lung remains clear. IMPRESSION: New right lower lobe opacity and small right pleural effusion, suspicious for pneumonia. Recommend chest radiographic follow-up to ensure resolution. Electronically Signed   By: Earle Gell M.D.   On: 06/20/2018 10:05    Procedures Procedures (including critical care time)  Medications Ordered in ED Medications - No data to display   Initial Impression / Assessment and Plan / ED Course  I have reviewed the triage vital signs and the nursing notes.  Pertinent labs & imaging results that were available during my  care of the patient were reviewed by me and considered in my medical decision making (see chart for details).        Patient presents from the nursing home with shortness of breath and cough.  Does state some mild production with the cough.  X-ray shows pneumonia.  However BNP is elevated troponin is mildly elevated does have more fluid on her legs.  I think will require admission to the hospital.  She is on chronic oxygen.  Will admit to hospitalist.  Final Clinical Impressions(s) / ED Diagnoses   Final diagnoses:  HCAP (healthcare-associated pneumonia)    ED Discharge Orders    None       Davonna Belling, MD 06/20/18 1036

## 2018-06-20 NOTE — ED Notes (Signed)
Pt is normally on 3L of oxygen

## 2018-06-20 NOTE — Progress Notes (Signed)
CRITICAL VALUE ALERT  Critical Value: Troponin 0.11  Date & Time Notied:  06/20/18 2132  Provider Notified: Mid Level, [place]  Orders Received/Actions taken: No new orders at this time

## 2018-06-20 NOTE — Progress Notes (Signed)
History and Physical  NELY DEDMON CWC:[id number] DOB: [birth year] DOA: 06/20/2018   PCP: Celene Squibb, MD   Patient coming from: Home  Chief Complaint: dyspnea and cough  HPI:  JOSEE SPEECE is a 85 y.o. female with medical history of diastolic CHF, GERD, and recent left hip fracture presenting with 1 to 2-day history of worsening shortness of breath.  Review the medical record shows that the patient has had some shortness of breath for approximately 1 week.  She denies any fevers, chills, chest pain but complains of a nonproductive cough and chest congestion.  She denies any nausea, vomiting, diarrhea, abdominal pain, headache, sore throat.  Patient was recently admitted to the hospital from 04/30/2018 through 05/07/2018 secondary to a left hip fracture from mechanical fall.  She underwent ORIF of the left hip on 05/01/2018, and she was discharged to the [facility] for rehab.  Since she has been at the [facility], it is been noted that she has had some hypoxia and worsening lower extremity edema.  Notably, the patient had her furosemide increased by her primary provider on 06/09/2018 from 20 mg daily to 40 mg daily.  During that office visit, the patient had a weight of 171 pounds.  However, it was noted on 05/28/2018 during a medical visit, the patient had a weight of 163 pounds.  Prior to her admission for her left hip fracture, the patient did not require any supplemental oxygen.  However since she has transitioned to the [facility], she has been placed on 2 L nasal cannula secondary to hypoxia and shortness of breath.  The patient does note some occasional orthopnea type symptoms with worsening lower extremity edema. In the emergency department, the patient was afebrile and hemodynamically stable saturating 94-95% on 2 L.  BMP, LFTs, CBC were essentially unremarkable.  Hemoglobin was 9.2 which is at her usual baseline.  BMP was 434.0.  Troponin was 0.15.  Chest x-ray showed right lower  lobe opacity with increased interstitial markings.  The patient was started on vancomycin and cefepime.  Assessment/Plan: Acute on chronic diastolic CHF -7/61/6073 echo EF 55 to 60%, grade 1 DD, PASP 47 -Start IV furosemide 40 mg IV twice daily -Daily weights -Accurate I's and O's -Echocardiogram  Elevated troponin -Patient has no chest pain -Likely represents demand ischemia -Cycle troponin  Lobar pneumonia/HCAP -Start vancomycin and cefepime -06/18/2018 influenza PCR negative -Start bronchodilators  Chronic respiratory failure with hypoxia -Patient is chronically on 2 L nasal cannula  GERD -Continue PPI  Recent left hip fracture -05/01/2018 ORIF left hip -PT evaluation -Tramadol PRN pain        Past Medical History:  Diagnosis Date  . Chronic anemia 05/01/2018  . Chronic diastolic heart failure ([place]) 06/12/2018  . CKD (chronic kidney disease), stage III ([place]) 05/01/2018  . Closed fracture of olecranon process of ulna 11/07/2009   Qualifier: Diagnosis of  By: Danne Harbor, Chasity L   . Constipation 05/01/2018  . CTS (carpal tunnel syndrome) 03/04/2011  . GERD (gastroesophageal reflux disease)   . RUPTURE ROTATOR CUFF 03/19/2010   Qualifier: Diagnosis of  By: Aline Brochure MD, Dorothyann Peng     Past Surgical History:  Procedure Laterality Date  . APPENDECTOMY     85 yr old  . BREAST LUMPECTOMY    . CATARACT EXTRACTION    . ELBOW SURGERY    . HEMORROIDECTOMY    . HERNIA REPAIR    . INTRAMEDULLARY (IM) NAIL INTERTROCHANTERIC Left  05/01/2018   Procedure: LEFT BIOMET AFFIXUS (IM) SHORT NAIL, INTERTROCHANTRIC FRACTURE;  Surgeon: [name], MD;  Location: [place];  Service: Orthopedics;  Laterality: Left;  . SHOULDER SURGERY     left rotator cuff  . TONSILLECTOMY AND ADENOIDECTOMY     11 yr  . tubal removal     Social History:  reports that she has never smoked. She has never used smokeless tobacco. She reports that she does not drink alcohol. No history on file for  drug.   Family History  Problem Relation Age of Onset  . Cancer Other   . Diabetes Other      Allergies  Allergen Reactions  . Aspirin     Stomach problem  . Codeine     hallucinate  . Penicillins Rash    Has patient had a PCN reaction causing immediate rash, facial/tongue/throat swelling, SOB or lightheadedness with hypotension: Yes Has patient had a PCN reaction causing severe rash involving mucus membranes or skin necrosis: No Has patient had a PCN reaction that required hospitalization No Has patient had a PCN reaction occurring within the last 10 years: No If all of the above answers are "NO", then may proceed with Cephalosporin use.      Prior to Admission medications   Medication Sig Start Date End Date Taking? Authorizing Provider  acetaminophen (TYLENOL) 500 MG tablet Take 1,000 mg by mouth every 8 (eight) hours.  06/15/18   [provider]  albuterol (PROVENTIL) (2.5 MG/3ML) 0.083% nebulizer solution Take 3 mLs (2.5 mg total) by nebulization every 4 (four) hours as needed for wheezing or shortness of breath. 05/15/13   Hosie Poisson, MD  docusate sodium (COLACE) 100 MG capsule Take 100 mg by mouth 2 (two) times daily.    [provider]  ferrous sulfate 325 (65 FE) MG EC tablet Take 325 mg by mouth daily with breakfast.    [provider]  furosemide (LASIX) 20 MG tablet Take 20 mg by mouth daily.    [provider]  GLUCERNA (GLUCERNA) LIQD Take 237 mLs by mouth daily. 05/28/18   [provider]  NON FORMULARY Diet Type:  Upgrade diet to D2 / ground and continue with thin liquid 05/25/18   [provider]  omeprazole (PRILOSEC) 20 MG capsule Take 20 mg by mouth daily.  07/22/10   [provider]  OXYGEN Inhale 2 L/min into the lungs continuous. To keep sats above 90%     [provider]  polyethylene glycol (MIRALAX / GLYCOLAX) packet Take 17 g by mouth daily as needed for mild constipation or moderate  constipation. 05/07/18   Rai, Ripudeep K, MD  potassium chloride (K-DUR,KLOR-CON) 10 MEQ tablet Take 10 mEq by mouth daily. 06/10/18   [provider]  simethicone (MYLICON) 80 MG chewable tablet Chew 80 mg by mouth every 4 (four) hours as needed for flatulence.    [provider]  traMADol (ULTRAM) 50 MG tablet Take 1 tablet (50 mg total) by mouth every 6 (six) hours as needed for up to 7 days. 06/15/18 06/22/18  Gerlene Fee, NP    Review of Systems:  Constitutional:  No weight loss, night sweats, Fevers, chills, fatigue.  Head&Eyes: No headache.  No vision loss.  No eye pain or scotoma ENT:  No Difficulty swallowing,Tooth/dental problems,Sore throat,  No ear ache, post nasal drip,  Cardio-vascular:  No chest pain, Orthopnea, PND, swelling in lower extremities,  dizziness, palpitations  GI:  No  abdominal  pain, nausea, vomiting, diarrhea, loss of appetite, hematochezia, melena, heartburn, indigestion, Resp:  No shortness of breath with exertion or at rest. No cough. No coughing up of blood .No wheezing.No chest wall deformity  Skin:  no rash or lesions.  GU:  no dysuria, change in color of urine, no urgency or frequency. No flank pain.  Musculoskeletal:  No joint pain or swelling. No decreased range of motion. No back pain.  Psych:  No change in mood or affect. No depression or anxiety. Neurologic: No headache, no dysesthesia, no focal weakness, no vision loss. No syncope  Physical Exam: Vitals:   06/20/18 1100 06/20/18 1115 06/20/18 1130 06/20/18 1200  BP: (!) 117/91  (!) 123/59 139/74  Pulse:    87  Resp: (!) 22 20 (!) 24 (!) 35  Temp:      TempSrc:      SpO2: 92%   94%  Weight:      Height:       General:  A&O x 3, NAD, nontoxic, pleasant/cooperative Head/Eye: No conjunctival hemorrhage, no icterus, [place]/AT, No nystagmus ENT:  No icterus,  No thrush, good dentition, no pharyngeal exudate Neck:  No masses, no lymphadenpathy, no bruits CV:  RRR, no  rub, no gallop, no S3 Lung:  CTAB, good air movement, no wheeze, no rhonchi Abdomen: soft/NT, +BS, nondistended, no peritoneal signs Ext: No cyanosis, No rashes, No petechiae, No lymphangitis, No edema Neuro: CNII-XII intact, strength 4/5 in bilateral upper and lower extremities, no dysmetria  Labs on Admission:  Basic Metabolic Panel: Recent Labs  Lab 06/20/18 0855  NA 143  K 4.0  CL 100  CO2 36*  GLUCOSE 125*  BUN 24*  CREATININE 0.58  CALCIUM 8.6*   Liver Function Tests: Recent Labs  Lab 06/20/18 0855  AST 17  ALT 10  ALKPHOS 57  BILITOT 0.8  PROT 5.7*  ALBUMIN 2.7*   No results for input(s): LIPASE, AMYLASE in the last 168 hours. No results for input(s): AMMONIA in the last 168 hours. CBC: Recent Labs  Lab 06/20/18 0855  WBC 7.1  NEUTROABS 5.0  HGB 9.2*  HCT 31.3*  MCV 104.3*  PLT 163   Coagulation Profile: No results for input(s): INR, PROTIME in the last 168 hours. Cardiac Enzymes: Recent Labs  Lab 06/20/18 0855  TROPONINI 0.15*   BNP: Invalid input(s): POCBNP CBG: No results for input(s): GLUCAP in the last 168 hours. Urine analysis:    Component Value Date/Time   COLORURINE AMBER (A) 05/05/2018 1446   APPEARANCEUR CLOUDY (A) 05/05/2018 1446   LABSPEC 1.028 05/05/2018 1446   PHURINE 5.0 05/05/2018 1446   GLUCOSEU NEGATIVE 05/05/2018 1446   HGBUR MODERATE (A) 05/05/2018 1446   BILIRUBINUR NEGATIVE 05/05/2018 1446   KETONESUR NEGATIVE 05/05/2018 1446   PROTEINUR 30 (A) 05/05/2018 1446   UROBILINOGEN 0.2 05/11/2013 1548   NITRITE NEGATIVE 05/05/2018 1446   LEUKOCYTESUR MODERATE (A) 05/05/2018 1446   Sepsis Labs: @LABRCNTIP (procalcitonin:4,lacticidven:4) )No results found for this or any previous visit (from the past 240 hour(s)).   Radiological Exams on Admission: Dg Chest 2 View  Result Date: 06/20/2018 CLINICAL DATA:  Shortness of breath and productive cough for 1 week. EXAM: CHEST - 2 VIEW COMPARISON:  05/03/2018 FINDINGS: Stable  mild cardiomegaly and ectasia of the thoracic aorta. Aortic atherosclerosis. New right lower lobe opacity and small right pleural effusion, suspicious for pneumonia. Left lung remains clear. IMPRESSION: New right lower lobe opacity and small right pleural effusion, suspicious for pneumonia. Recommend chest radiographic  follow-up to ensure resolution. Electronically Signed   By: Earle Gell M.D.   On: 06/20/2018 10:05    EKG: Independently reviewed.  Sinus, no STT changes    Time spent:60 minutes Code Status:   DNR Family Communication:  No Family at bedside Disposition Plan: expect 2-3 day hospitalization Consults called: none DVT Prophylaxis: [place] Lovenox  Orson Tauni, DO  [facility] Pager [phone]  If 7PM-7AM, please contact night-coverage [email or web address] Password [facility] 06/20/2018, 12:13 PM

## 2018-06-20 NOTE — Progress Notes (Addendum)
Pharmacy Anti-Microbial Dosing Note:  Pharmacy consulted to dose cefepime for this 85 yo female with pneumonia.  MRSA PCR  nasal screen is negative (vancomycin discontinued) and no other cultures have been ordered at this time.  .  Estimated Creatinine Clearance: 31.1 mL/min (by C-G formula based on SCr of 0.58 mg/dL).   Allergies  Allergen Reactions  . Aspirin     Stomach problem  . Codeine     hallucinate  . Penicillins Rash    Has patient had a PCN reaction causing immediate rash, facial/tongue/throat swelling, SOB or lightheadedness with hypotension: Yes Has patient had a PCN reaction causing severe rash involving mucus membranes or skin necrosis: No Has patient had a PCN reaction that required hospitalization No Has patient had a PCN reaction occurring within the last 10 years: No If all of the above answers are "NO", then may proceed with Cephalosporin use.     Vitals:   06/20/18 0915 06/20/18 0930  BP:  128/61  Pulse: 75 86  Resp: (!) 22 (!) 26  Temp:    SpO2: 94% 92%    Anti-infectives (From admission, onward)   Start     Dose/Rate Route Frequency Ordered Stop   06/20/18 1200  vancomycin (VANCOCIN) 1,500 mg in sodium chloride 0.9 % 500 mL IVPB  Status:  Discontinued     1,500 mg 250 mL/hr over 120 Minutes Intravenous  Once 06/20/18 1054 06/20/18 1055   06/20/18 1200  vancomycin (VANCOCIN) 1,500 mg in sodium chloride 0.9 % 500 mL IVPB     1,500 mg 250 mL/hr over 120 Minutes Intravenous  Once 06/20/18 1056     06/20/18 1100  ceFEPIme (MAXIPIME) 1 g in sodium chloride 0.9 % 100 mL IVPB     1 g 200 mL/hr over 30 Minutes Intravenous  Once 06/20/18 1046        Antimicrobials this admission:  cefepime 3/1 >>   vancomycin 3/1 >> 3/1    Microbiology results:    3/1 MRSA PCR:  negative  Plan: Cefepime 1g IV q24h  Pharmacy will continue to monitor renal function, V/S,  cultures and patient progress.   Despina Pole, [facility] 06/20/2018 10:58 AM

## 2018-06-20 NOTE — ED Triage Notes (Signed)
Pt brought in by [facility] staff for evaluation of SOB and productive cough for about a week.  Pt has been treated with albuterol, but no abx.

## 2018-06-20 NOTE — ED Notes (Signed)
CRITICAL VALUE ALERT  Critical Value:  Troponin @ 0936  Date & Time Notied:  06/20/2018  Provider Notified: Dr Alvino Chapel  Orders Received/Actions taken: Orders to be given

## 2018-06-21 ENCOUNTER — Inpatient Hospital Stay (HOSPITAL_COMMUNITY): Payer: Medicare HMO

## 2018-06-21 DIAGNOSIS — D696 Thrombocytopenia, unspecified: Secondary | ICD-10-CM

## 2018-06-21 DIAGNOSIS — J9621 Acute and chronic respiratory failure with hypoxia: Secondary | ICD-10-CM

## 2018-06-21 LAB — BLOOD GAS, ARTERIAL
Acid-Base Excess: 13.8 mmol/L — ABNORMAL HIGH (ref 0.0–2.0)
Bicarbonate: 36.9 mmol/L — ABNORMAL HIGH (ref 20.0–28.0)
FIO2: 80
O2 Saturation: 99.2 %
Patient temperature: 37
pCO2 arterial: 63 mmHg — ABNORMAL HIGH (ref 32.0–48.0)
pH, Arterial: 7.41 (ref 7.350–7.450)
pO2, Arterial: 184 mmHg — ABNORMAL HIGH (ref 83.0–108.0)

## 2018-06-21 LAB — CBC
HCT: 32.5 % — ABNORMAL LOW (ref 36.0–46.0)
Hemoglobin: 9.8 g/dL — ABNORMAL LOW (ref 12.0–15.0)
MCH: 31.2 pg (ref 26.0–34.0)
MCHC: 30.2 g/dL (ref 30.0–36.0)
MCV: 103.5 fL — ABNORMAL HIGH (ref 80.0–100.0)
Platelets: 154 10*3/uL (ref 150–400)
RBC: 3.14 MIL/uL — ABNORMAL LOW (ref 3.87–5.11)
RDW: 14.8 % (ref 11.5–15.5)
WBC: 4.2 10*3/uL (ref 4.0–10.5)
nRBC: 0 % (ref 0.0–0.2)

## 2018-06-21 LAB — BASIC METABOLIC PANEL
Anion gap: 10 (ref 5–15)
BUN: 23 mg/dL (ref 8–23)
CALCIUM: 8.6 mg/dL — AB (ref 8.9–10.3)
CO2: 36 mmol/L — ABNORMAL HIGH (ref 22–32)
CREATININE: 0.6 mg/dL (ref 0.44–1.00)
Chloride: 97 mmol/L — ABNORMAL LOW (ref 98–111)
GFR calc Af Amer: >60 mL/min (ref >60)
GFR calc non Af Amer: >60 mL/min (ref >60)
Glucose, Bld: 113 mg/dL — ABNORMAL HIGH (ref 70–99)
Potassium: 3.5 mmol/L (ref 3.5–5.1)
Sodium: 143 mmol/L (ref 135–145)

## 2018-06-21 LAB — RESPIRATORY PANEL BY PCR
Adenovirus: NOT DETECTED
BORDETELLA PERTUSSIS-RVPCR: NOT DETECTED
Chlamydophila pneumoniae: NOT DETECTED
Coronavirus 229E: NOT DETECTED
Coronavirus HKU1: NOT DETECTED
Coronavirus NL63: NOT DETECTED
Coronavirus OC43: NOT DETECTED
Influenza A: NOT DETECTED
Influenza B: NOT DETECTED
METAPNEUMOVIRUS-RVPPCR: NOT DETECTED
Mycoplasma pneumoniae: NOT DETECTED
Parainfluenza Virus 1: NOT DETECTED
Parainfluenza Virus 2: NOT DETECTED
Parainfluenza Virus 3: NOT DETECTED
Parainfluenza Virus 4: NOT DETECTED
Respiratory Syncytial Virus: NOT DETECTED
Rhinovirus / Enterovirus: NOT DETECTED

## 2018-06-21 MED ORDER — METHYLPREDNISOLONE SODIUM SUCC 125 MG IJ SOLR
60.0000 mg | Freq: Every day | INTRAMUSCULAR | Status: DC
  Administered 2018-06-21: 60 mg via INTRAVENOUS
  Filled 2018-06-21: qty 2

## 2018-06-21 MED ORDER — BUDESONIDE 0.5 MG/2ML IN SUSP
0.5000 mg | Freq: Two times a day (BID) | RESPIRATORY_TRACT | Status: DC
  Administered 2018-06-21 – 2018-06-28 (×15): 0.5 mg via RESPIRATORY_TRACT
  Filled 2018-06-21 (×14): qty 2

## 2018-06-21 MED ORDER — METHYLPREDNISOLONE SODIUM SUCC 125 MG IJ SOLR
60.0000 mg | Freq: Three times a day (TID) | INTRAMUSCULAR | Status: DC
  Administered 2018-06-21 – 2018-06-25 (×12): 60 mg via INTRAVENOUS
  Filled 2018-06-21 (×12): qty 2

## 2018-06-21 MED ORDER — METHYLPREDNISOLONE SODIUM SUCC 40 MG IJ SOLR
40.0000 mg | Freq: Once | INTRAMUSCULAR | Status: AC
  Administered 2018-06-21: 40 mg via INTRAVENOUS
  Filled 2018-06-21: qty 1

## 2018-06-21 MED ORDER — GUAIFENESIN 100 MG/5ML PO SOLN
20.0000 mL | Freq: Three times a day (TID) | ORAL | Status: DC
  Administered 2018-06-21 – 2018-06-28 (×18): 400 mg via ORAL
  Filled 2018-06-21: qty 20
  Filled 2018-06-21: qty 5
  Filled 2018-06-21: qty 20
  Filled 2018-06-21: qty 5
  Filled 2018-06-21 (×14): qty 20
  Filled 2018-06-21: qty 5
  Filled 2018-06-21: qty 20

## 2018-06-21 MED ORDER — POTASSIUM CHLORIDE CRYS ER 20 MEQ PO TBCR
20.0000 meq | EXTENDED_RELEASE_TABLET | Freq: Every day | ORAL | Status: DC
  Administered 2018-06-21 – 2018-06-27 (×7): 20 meq via ORAL
  Filled 2018-06-21 (×8): qty 1

## 2018-06-21 MED ORDER — NITROGLYCERIN 2 % TD OINT
0.5000 [in_us] | TOPICAL_OINTMENT | Freq: Once | TRANSDERMAL | Status: AC
  Administered 2018-06-21: 0.5 [in_us] via TOPICAL
  Filled 2018-06-21: qty 1

## 2018-06-21 MED ORDER — FUROSEMIDE 10 MG/ML IJ SOLN
60.0000 mg | Freq: Once | INTRAMUSCULAR | Status: AC
  Administered 2018-06-21: 60 mg via INTRAVENOUS

## 2018-06-21 MED ORDER — LORAZEPAM 2 MG/ML IJ SOLN: 0.5000 mg | Freq: Two times a day (BID) | INTRAMUSCULAR | Status: DC | PRN

## 2018-06-21 MED ORDER — FUROSEMIDE 10 MG/ML IJ SOLN
INTRAMUSCULAR | Status: AC
  Filled 2018-06-21: qty 8

## 2018-06-21 MED ORDER — LORAZEPAM 2 MG/ML IJ SOLN
0.5000 mg | Freq: Once | INTRAMUSCULAR | Status: AC
  Administered 2018-06-21: 0.5 mg via INTRAVENOUS
  Filled 2018-06-21: qty 1

## 2018-06-21 NOTE — Progress Notes (Signed)
Patient resting in bed with bipap on. No distress noted.

## 2018-06-21 NOTE — Progress Notes (Signed)
Night shift telemetry coverage note.  The patient was seen due to dyspnea. She had been requiring NRB mask oxygen. She had a recent bronchodilators neb treatment and received several doses of furosemide earlier. She is afebrile. Her most recent VS are:   101Abnormal   -  28Abnormal   157/77Abnormal   Lying  99 %   She has been tachypneic in the mid to upper 20s BPM, having decreased breath sounds with bilateral rales and wheezing on exam. She has been having good output. Her CxRay is basically unchanged. She was started BiPAP at bedtime earlier with good results so far. I will add nitropaste ointment, solumedrol 40 mg IVP x1 and lorazepam 0.5 mg IVP while on BiPAP.   Tennis Must, MD

## 2018-06-21 NOTE — Progress Notes (Signed)
Notified by telemetry, pt had 4 beat and 8 beat run of Vtach. Vitals obtained. Mid Level notified. No new orders.

## 2018-06-21 NOTE — Clinical Social Work Note (Signed)
Clinical Social Work Assessment  Patient Details  Name: Hannah Arias MRN: [id number] Date of Birth: 06/20/1910  Date of referral:  06/21/18               Reason for consult:  Discharge Planning                Permission sought to share information with:    Permission granted to share information::     Name::        Agency::     Relationship::     Contact Information:     Housing/Transportation Living arrangements for the past 2 months:  [place] of Information:  Facility Patient Interpreter Needed:  None Criminal Activity/Legal Involvement Pertinent to Current Situation/Hospitalization:  No - Comment as needed Significant Relationships:  Adult Children Lives with:  Facility Resident Do you feel safe going back to the place where you live?  Yes Need for family participation in patient care:  No (Coment)  Care giving concerns: Pt admitted from [facility].   Social Worker assessment / plan: Pt is a 85 year old female admitted from [facility]. Spoke with Marianna Fuss at [facility] to update. Pt has been in LTC at [facility] per [place]. Chart review shows that pt had been living alone in her own home until January of this year. Pt's grandson is her POA. Will speak with him during pt's stay to assist with dc planning.  Employment status:  Retired Nurse, adult PT Recommendations:  [place] / Referral to community resources:  [place]  Patient/Family's Response to care: Pt accepting of care.  Patient/Family's Understanding of and Emotional Response to Diagnosis, Current Treatment, and Prognosis: Family has good understanding of diagnosis and treatment recommendations. No emotional distress identified.  Emotional Assessment Appearance:  Appears stated age Attitude/Demeanor/Rapport:    Affect (typically observed):  Unable to Assess Orientation:  Oriented to Self Alcohol / Substance use:  Not Applicable Psych  involvement (Current and /or in the community):  No (Comment)  Discharge Needs  Concerns to be addressed:  Discharge Planning Concerns Readmission within the last 30 days:  No Current discharge risk:  Dependent with Mobility, Physical Impairment Barriers to Discharge:  Continued Medical Work up   Avery Dennison, LCSW 06/21/2018, 11:02 AM

## 2018-06-21 NOTE — Progress Notes (Signed)
Patient's breathing is labored, respirations 28 per min. Currently on nonrebreather with O2 sat 99%. Dr. Olevia Bowens notified. Order for bipap placed. Respiratory notified. Will continue to monitor.

## 2018-06-21 NOTE — Progress Notes (Signed)
PROGRESS NOTE  Hannah Arias KMM:[id number] DOB: 1910/12/11 DOA: 06/20/2018 PCP: Celene Squibb, MD  Brief History:   85 y.o. female with medical history of diastolic CHF, GERD, and recent left hip fracture presenting with 1 to 2-day history of worsening shortness of breath.  Review the medical record shows that the patient has had some shortness of breath for approximately 1 week.  She denies any fevers, chills, chest pain but complains of a nonproductive cough and chest congestion.    Patient was recently admitted to the hospital from 04/30/2018 through 05/07/2018 secondary to a left hip fracture from mechanical fall.  She underwent ORIF of the left hip on 05/01/2018, and she was discharged to the [place] for rehab.  Since she has been at the [facility], it is been noted that she has had some hypoxia and worsening lower extremity edema.  Notably, the patient had her furosemide increased by her primary provider on 06/09/2018 from 20 mg daily to 40 mg daily.  During that office visit, the patient had a weight of 171 pounds.  However, it was noted on 05/28/2018 during a medical visit, the patient had a weight of 163 pounds.  Prior to her admission for her left hip fracture, the patient did not require any supplemental oxygen.  However since she has transitioned to the [facility], she has been placed on 2 L nasal cannula secondary to hypoxia and shortness of breath.   The patient does note some occasional orthopnea type symptoms with worsening lower extremity edema. In the emergency department, the patient was afebrile and hemodynamically stable saturating 94-95% on 2 L.  BMP, LFTs, CBC were essentially unremarkable.  Hemoglobin was 9.2 which is at her usual baseline.  BMP was 434.0.  Troponin was 0.15.  Chest x-ray showed right lower lobe opacity with increased interstitial markings.  The patient was started on vancomycin and cefepime.  In the early am 06/21/18, pt had respiratory distress  requiring BiPAP for short period of time.  Repeat cxr did not show much change from 06/20/18.  Assessment/Plan: Acute on chronic diastolic CHF -7/90/3833 echo EF 55 to 60%, grade 1 DD, PASP 47 -Start IV furosemide 40 mg IV twice daily -3/20 weight 164.9 lbs -Accurate I's and O's -Echocardiogram -remains fluid overloaded with JVD  Elevated troponin -Patient has no chest pain -trend is flat -Likely represents demand ischemia -personally reviewed EKG--sinus, no STT changes  Lobar pneumonia/HCAP -Continue cefepime -d/c vanco -06/18/2018 influenza PCR negative -continue duonebs -add pulmicort -pt has increased bronchospasm -add solumedrol 60 mg daily -viral respiratory panel  Acute on Chronic respiratory failure with hypoxia -Patient is chronically on 2 L nasal cannula -pt required BiPAP early am 06/21/18-->now back to [place]  GERD -Continue PPI  Recent left hip fracture -05/01/2018 ORIF left hip -PT evaluation -Tramadol PRN pain  Thrombocytopenia -has been chronic intermittent -drop due to acute illness -serum X83 -serum folic acid   Disposition Plan:   SNF 2-3 days  Family Communication:   No Family at bedside  Consultants: none    Code Status:  FULL   DVT Prophylaxis:  [place] Lovenox   Procedures: As Listed in Progress Note Above  Antibiotics: vanco 3/1 Cefepime 3/2>>       Subjective: Pt breathing better than last night, but still has lot of cough and congestion.  Denies f/c, n/v/d, abd pain.  No hemoptysis.  Objective: Vitals:   06/21/18 0142 06/21/18 0500  06/21/18 0620 06/21/18 0800  BP:   (!) 152/90   Pulse: 97  91   Resp: (!) 22     Temp:   98.4 F (36.9 C)   TempSrc:   Oral   SpO2: 98%  95% 94%  Weight:  74.8 kg    Height:        Intake/Output Summary (Last 24 hours) at 06/21/2018 1016 Last data filed at 06/21/2018 0600 Gross per 24 hour  Intake 720 ml  Output 1600 ml  Net -880 ml   Weight change:  Exam:   General:  Pt is alert,  follows commands appropriately, not in acute distress  HEENT: No icterus, No thrush, No neck mass, [place]/AT  Cardiovascular: RRR, S1/S2, no rubs, no gallops  Respiratory: bilateral rhonchi.  Bilateral wheezing  Abdomen: Soft/+BS, non tender, non distended, no guarding  Extremities: 2+LE edema, No lymphangitis, No petechiae, No rashes, no synovitis   Data Reviewed: I have personally reviewed following labs and imaging studies Basic Metabolic Panel: Recent Labs  Lab 06/20/18 0855 06/21/18 0542  NA 143 143  K 4.0 3.5  CL 100 97*  CO2 36* 36*  GLUCOSE 125* 113*  BUN 24* 23  CREATININE 0.58 0.60  CALCIUM 8.6* 8.6*   Liver Function Tests: Recent Labs  Lab 06/20/18 0855  AST 17  ALT 10  ALKPHOS 57  BILITOT 0.8  PROT 5.7*  ALBUMIN 2.7*   No results for input(s): LIPASE, AMYLASE in the last 168 hours. No results for input(s): AMMONIA in the last 168 hours. Coagulation Profile: No results for input(s): INR, PROTIME in the last 168 hours. CBC: Recent Labs  Lab 06/20/18 0855 06/21/18 0542  WBC 7.1 4.2  NEUTROABS 5.0  --   HGB 9.2* 9.8*  HCT 31.3* 32.5*  MCV 104.3* 103.5*  PLT 163 154   Cardiac Enzymes: Recent Labs  Lab 06/20/18 0855 06/20/18 1329 06/20/18 2022  TROPONINI 0.15* 0.14* 0.11*   BNP: Invalid input(s): POCBNP CBG: No results for input(s): GLUCAP in the last 168 hours. HbA1C: No results for input(s): HGBA1C in the last 72 hours. Urine analysis:    Component Value Date/Time   COLORURINE AMBER (A) 05/05/2018 1446   APPEARANCEUR CLOUDY (A) 05/05/2018 1446   LABSPEC 1.028 05/05/2018 1446   PHURINE 5.0 05/05/2018 1446   GLUCOSEU NEGATIVE 05/05/2018 1446   HGBUR MODERATE (A) 05/05/2018 1446   BILIRUBINUR NEGATIVE 05/05/2018 1446   KETONESUR NEGATIVE 05/05/2018 1446   PROTEINUR 30 (A) 05/05/2018 1446   UROBILINOGEN 0.2 05/11/2013 1548   NITRITE NEGATIVE 05/05/2018 1446   LEUKOCYTESUR MODERATE (A) 05/05/2018 1446   Sepsis  Labs: @LABRCNTIP (procalcitonin:4,lacticidven:4) ) Recent Results (from the past 240 hour(s))  MRSA PCR Screening     Status: None   Collection Time: 06/20/18 11:40 AM  Result Value Ref Range Status   MRSA by PCR NEGATIVE NEGATIVE Final    Comment:        The GeneXpert MRSA Assay (FDA approved for NASAL specimens only), is one component of a comprehensive MRSA colonization surveillance program. It is not intended to diagnose MRSA infection nor to guide or monitor treatment for MRSA infections. Performed at [facility], [address], [place],  [address]      Scheduled Meds: . budesonide (PULMICORT) nebulizer solution  0.5 mg Nebulization BID  . docusate sodium  100 mg Oral BID  . enoxaparin (LOVENOX) injection  40 mg Subcutaneous Q24H  . ferrous sulfate  325 mg Oral Q breakfast  . furosemide  40 mg Intravenous BID  . GLUCERNA  237 mL Oral Daily  . guaiFENesin  20 mL Oral TID  . ipratropium-albuterol  3 mL Nebulization Q6H  . mouth rinse  15 mL Mouth Rinse BID  . methylPREDNISolone (SOLU-MEDROL) injection  60 mg Intravenous Daily  . pantoprazole  40 mg Oral Daily  . potassium chloride  20 mEq Oral Daily  . sodium chloride flush  3 mL Intravenous Q12H   Continuous Infusions: . sodium chloride    . ceFEPime (MAXIPIME) IV      Procedures/Studies: Dg Chest 2 View  Result Date: 06/20/2018 CLINICAL DATA:  Shortness of breath and productive cough for 1 week. EXAM: CHEST - 2 VIEW COMPARISON:  05/03/2018 FINDINGS: Stable mild cardiomegaly and ectasia of the thoracic aorta. Aortic atherosclerosis. New right lower lobe opacity and small right pleural effusion, suspicious for pneumonia. Left lung remains clear. IMPRESSION: New right lower lobe opacity and small right pleural effusion, suspicious for pneumonia. Recommend chest radiographic follow-up to ensure resolution. Electronically Signed   By: Earle Gell M.D.   On: 06/20/2018 10:05   Dg Chest Port 1 View  Result Date:  06/21/2018 CLINICAL DATA:  Labored breathing EXAM: PORTABLE CHEST 1 VIEW COMPARISON:  06/20/2018, CT 05/03/2018, radiograph 04/30/2018 FINDINGS: Bilateral pleural effusions, right greater than left with continued airspace disease at the right lung base. Stable cardiomediastinal silhouette with aortic atherosclerosis. No pneumothorax. IMPRESSION: 1. Similar appearance of right greater than left pleural effusion with continued airspace disease at the right base which may reflect atelectasis or pneumonia Electronically Signed   By: Donavan Foil M.D.   On: 06/21/2018 02:04   Xr Hip Unilat W Or W/o Pelvis 2-3 Views Left  Result Date: 06/17/2018 AP lateral left hip obtained and reviewed this shows trochanteric nail with interval healing.  No evidence of loosening or subsidence. Impression: Satisfactory postop ORIF left hip fracture with truck nail.   Orson Kieara, DO  [facility] Pager [phone]  If 7PM-7AM, please contact night-coverage [email or web address] Password [email or web address] 06/21/2018, 10:16 AM   LOS: 1 day

## 2018-06-21 NOTE — Progress Notes (Signed)
Patients breathing labored. Rhonchi bilaterally. O2 sat 63% on 5L nasal cannula. Respiratory notified and placed on nonrebreather. O2 sat 93% at this time. Mid Level notified. New orders placed for IV Lasix.

## 2018-06-21 NOTE — Progress Notes (Signed)
Patient placed on bipap

## 2018-06-21 NOTE — Care Management Important Message (Signed)
Important Message  Patient Details  Name: Hannah Arias MRN: [id number] Date of Birth: [birth year]   Medicare Important Message Given:  Yes    Tommy Medal 06/21/2018, 12:02 PM

## 2018-06-21 NOTE — NC FL2 (Signed)
[place] LEVEL OF CARE SCREENING TOOL     IDENTIFICATION  Patient Name: Hannah Arias Birthdate: June 25, 1910 Sex: female Admission Date (Current Location): 06/20/2018  [facility] and [place] Number:  [employer] and Address:  [place] [address] [place]      Provider Number: [id number]  Attending Physician Name and Address:  Orson Yomaris, MD  Relative Name and Phone Number:       Current Level of Care: Hospital Recommended Level of Care: [place] Prior Approval Number: [id number]  Date Approved/Denied:   PASRR Number:    Discharge Plan: SNF    Current Diagnoses: Patient Active Problem List   Diagnosis Date Noted  . Acute on chronic respiratory failure with hypoxia ([place]) 06/21/2018  . Thrombocytopenia ([place]) 06/21/2018  . Acute on chronic diastolic CHF (congestive heart failure) ([place]) 06/20/2018  . Lobar pneumonia ([place]) 06/20/2018  . Elevated troponin 06/20/2018  . Chronic respiratory failure with hypoxia ([place]) 06/20/2018  . Chronic pain of multiple joints 06/20/2018  . HCAP (healthcare-associated pneumonia)   . Chronic diastolic heart failure ([place]) 06/12/2018  . Hypertension 05/10/2018  . Acute blood loss anemia 05/03/2018  . AKI (acute kidney injury) ([place]) 05/01/2018  . CKD (chronic kidney disease), stage III ([place]) 05/01/2018  . Abdominal hernia 05/01/2018  . Constipation 05/01/2018  . Chronic anemia 05/01/2018  . Hip fracture ([place]) 04/30/2018  . Hypoxia 05/11/2013    Orientation RESPIRATION BLADDER Height & Weight     Self, Place    Incontinent Weight: 164 lb 14.5 oz (74.8 kg) Height:  5\' 3"  (160 cm)  BEHAVIORAL SYMPTOMS/MOOD NEUROLOGICAL BOWEL NUTRITION STATUS      Incontinent Diet(regular diet)  AMBULATORY STATUS COMMUNICATION OF NEEDS Skin   Extensive Assist Verbally Normal                       Personal Care Assistance Level of Assistance    Bathing Assistance: Limited  assistance Feeding assistance: Independent Dressing Assistance: Limited assistance     Functional Limitations Info    Sight Info: Adequate Hearing Info: Impaired(pt very HOH) Speech Info: Adequate    SPECIAL CARE FACTORS FREQUENCY                       Contractures Contractures Info: Not present    Additional Factors Info    Code Status Info: DNR Allergies Info: Aspirin, Codeine, Penicillins           Current Medications (06/21/2018):  This is the current hospital active medication list Current Facility-Administered Medications  Medication Dose Route Frequency Provider Last Rate Last Dose  . 0.9 %  sodium chloride infusion  250 mL Intravenous PRN Tat, David, MD      . acetaminophen (TYLENOL) tablet 650 mg  650 mg Oral Q4H PRN Orson Taira, MD   650 mg at 06/20/18 1633  . budesonide (PULMICORT) nebulizer solution 0.5 mg  0.5 mg Nebulization BID Tat, David, MD      . ceFEPIme (MAXIPIME) 1 g in sodium chloride 0.9 % 100 mL IVPB  1 g Intravenous Q24H Tat, David, MD      . docusate sodium (COLACE) capsule 100 mg  100 mg Oral BID Orson Seferina, MD   100 mg at 06/20/18 2155  . enoxaparin (LOVENOX) injection 40 mg  40 mg Subcutaneous Q24H Tat, Shanon Brow, MD   40 mg at 06/20/18 1339  . ferrous sulfate tablet 325 mg  325 mg Oral Q breakfast Tat, David, MD      . furosemide (LASIX) injection 40 mg  40 mg Intravenous BID Orson Rayhana, MD   40 mg at 06/21/18 0933  . GLUCERNA liquid 237 mL  237 mL Oral Daily Tat, David, MD   237 mL at 06/20/18 1519  . guaiFENesin (ROBITUSSIN) 100 MG/5ML solution 400 mg  20 mL Oral TID Tat, David, MD      . ipratropium-albuterol (DUONEB) 0.5-2.5 (3) MG/3ML nebulizer solution 3 mL  3 mL Nebulization Q6H Orson [place], MD   3 mL at 06/21/18 0758  . MEDLINE mouth rinse  15 mL Mouth Rinse BID Tat, David, MD      . methylPREDNISolone sodium succinate (SOLU-MEDROL) 125 mg/2 mL injection 60 mg  60 mg Intravenous Daily Tat, David, MD      . ondansetron [facility]) injection 4  mg  4 mg Intravenous Q6H PRN Tat, David, MD      . pantoprazole (PROTONIX) EC tablet 40 mg  40 mg Oral Daily Tat, David, MD   40 mg at 06/20/18 1339  . polyethylene glycol (MIRALAX / GLYCOLAX) packet 17 g  17 g Oral Daily PRN Tat, David, MD      . potassium chloride SA (K-DUR,KLOR-CON) CR tablet 20 mEq  20 mEq Oral Daily Tat, David, MD      . simethicone [employer]) chewable tablet 80 mg  80 mg Oral Q4H PRN Tat, David, MD      . sodium chloride flush (NS) 0.9 % injection 3 mL  3 mL Intravenous Therisa Doyne, MD   3 mL at 06/20/18 2155  . sodium chloride flush (NS) 0.9 % injection 3 mL  3 mL Intravenous PRN Tat, David, MD      . traMADol Veatrice Bourbon) tablet 50 mg  50 mg Oral Q6H PRN Orson Keairra, MD         Discharge Medications: Please see discharge summary for a list of discharge medications.  Relevant Imaging Results:  Relevant Lab Results:   Additional Information    Shade Flood, LCSW

## 2018-06-21 NOTE — Progress Notes (Signed)
Responded to nursing call:  hhypoxia   Subjective: Pt c/o sob. No cp  Vitals:   06/21/18 1118 06/21/18 1346 06/21/18 1424 06/21/18 1427  BP:  140/64    Pulse:  (!) 105    Resp:  19    Temp:  98 F (36.7 C)    TempSrc:  Oral    SpO2: 90% (!) 89% (!) 84% 93%  Weight:      Height:       CV--RRR Lung--bilateral scattered rales. Bilateral wheeze Abd--soft+BS/NT   Assessment/Plan: Acute respiratory failure -multifactorial--PNA, CHF, bronchospasm -pt has mild increase WOB -ABG -increase solumedrol to tid -place on BiPAP for now -start BiPAP qhs -continue current abx and lasix     Orson Cherice, DO [facility]

## 2018-06-22 LAB — MAGNESIUM: Magnesium: 1.8 mg/dL (ref 1.7–2.4)

## 2018-06-22 LAB — CBC WITH DIFFERENTIAL/PLATELET
ABS IMMATURE GRANULOCYTES: 0.04 10*3/uL (ref 0.00–0.07)
Basophils Absolute: 0 10*3/uL (ref 0.0–0.1)
Basophils Relative: 0 %
Eosinophils Absolute: 0 10*3/uL (ref 0.0–0.5)
Eosinophils Relative: 0 %
HCT: 31.2 % — ABNORMAL LOW (ref 36.0–46.0)
Hemoglobin: 9.4 g/dL — ABNORMAL LOW (ref 12.0–15.0)
Immature Granulocytes: 1 %
Lymphocytes Relative: 19 %
Lymphs Abs: 0.6 10*3/uL — ABNORMAL LOW (ref 0.7–4.0)
MCH: 31.2 pg (ref 26.0–34.0)
MCHC: 30.1 g/dL (ref 30.0–36.0)
MCV: 103.7 fL — ABNORMAL HIGH (ref 80.0–100.0)
Monocytes Absolute: 0.2 10*3/uL (ref 0.1–1.0)
Monocytes Relative: 6 %
Neutro Abs: 2.2 10*3/uL (ref 1.7–7.7)
Neutrophils Relative %: 74 %
Platelets: 154 10*3/uL (ref 150–400)
RBC: 3.01 MIL/uL — ABNORMAL LOW (ref 3.87–5.11)
RDW: 14.7 % (ref 11.5–15.5)
WBC: 3 10*3/uL — AB (ref 4.0–10.5)
nRBC: 0 % (ref 0.0–0.2)

## 2018-06-22 LAB — VITAMIN B12: Vitamin B-12: 449 pg/mL (ref 180–914)

## 2018-06-22 LAB — BASIC METABOLIC PANEL
Anion gap: 9 (ref 5–15)
BUN: 30 mg/dL — AB (ref 8–23)
CO2: 38 mmol/L — ABNORMAL HIGH (ref 22–32)
CREATININE: 0.85 mg/dL (ref 0.44–1.00)
Calcium: 8.6 mg/dL — ABNORMAL LOW (ref 8.9–10.3)
Chloride: 98 mmol/L (ref 98–111)
GFR calc non Af Amer: 54 mL/min — ABNORMAL LOW (ref >60)
Glucose, Bld: 172 mg/dL — ABNORMAL HIGH (ref 70–99)
Potassium: 3.9 mmol/L (ref 3.5–5.1)
SODIUM: 145 mmol/L (ref 135–145)

## 2018-06-22 LAB — FOLATE: Folate: 14 ng/mL (ref >5.9)

## 2018-06-22 MED ORDER — ENOXAPARIN SODIUM 30 MG/0.3ML ~~LOC~~ SOLN
30.0000 mg | SUBCUTANEOUS | Status: DC
  Administered 2018-06-22: 30 mg via SUBCUTANEOUS
  Filled 2018-06-22: qty 0.3

## 2018-06-22 NOTE — Progress Notes (Signed)
PROGRESS NOTE  KOURTNEI RAUBER FBP:[id number] DOB: 1910/12/26 DOA: 06/20/2018 PCP: Celene Squibb, MD  Brief History:  85 y.o.femalewith medical history ofdiastolic CHF, GERD, and recent left hip fracture presenting with 1 to 2-day history of worsening shortness of breath. Review the medical record shows that the patient has had some shortness of breath for approximately 1 week. She denies any fevers, chills, chest pain but complains of a nonproductive cough and chest congestion.  Patient was recently admitted to the hospital from 04/30/2018 through 05/07/2018 secondary to a left hip fracture from mechanical fall. She underwent ORIF of the left hip on 05/01/2018, and she was discharged to the [place] for rehab. Since she has been at the [facility], it is been noted that she has had some hypoxia and worsening lower extremity edema. Notably, the patient had her furosemide increased by her primary provider on 06/09/2018 from 20 mg daily to 40 mg daily. During that office visit, the patient had a weight of 171 pounds. However, it was noted on 05/28/2018 during a medical visit, the patient had a weight of 163 pounds. Prior to her admission for her left hip fracture, the patient did not require any supplemental oxygen. However since she has transitioned to the [facility], she has been placed on 2 L nasal cannula secondary to hypoxia and shortness of breath.  The patient does note some occasional orthopnea type symptoms with worsening lower extremity edema. In the emergency department, the patient was afebrile and hemodynamically stable saturating 94-95% on 2 L. BMP, LFTs, CBC were essentially unremarkable.Hemoglobin was 9.2 which is at her usual baseline. BMP was 434.0. Troponin was 0.15. Chest x-ray showed right lower lobe opacity with increased interstitial markings. The patient was started on vancomycin and cefepime.  In the early am 06/21/18, pt had respiratory distress  requiring BiPAP for short period of time.  Repeat cxr did not show much change from 06/20/18.  Assessment/Plan: Acute on chronic diastolic CHF -12/13/2351 echo EF 55 to 60%, grade 1 DD, PASP 47 -Start IV furosemide 40 mg IV twice daily -06/20/18 weight 169.9 lbs -06/22/18 weight 163.4 lbs -Accurate I's and O's--incomplete -[id number]--PP50-93%, +diastolic dysfx, RVSP 26.7 -remains fluid overloaded with JVD  Acute on Chronic respiratory failure with hypoxia -secondary to CHF and pneumonia -Patient is chronically on 2 L nasal cannula -pt required BiPAP early am 06/21/18-->now back to HFNC 8L -repeat CXR in am  Elevated troponin -Patient has no chest pain -trend is flat -Likely represents demand ischemia -personally reviewed EKG--sinus, no STT changes  Lobar pneumonia/HCAP -Continue cefepime -d/c vanco -06/18/2018 influenza PCR negative -continue duonebs -added pulmicort -pt has increased bronchospasm -added solumedrol 60 mg-->decrease back to once daily -viral respiratory panel--neg  GERD -Continue PPI  Recent left hip fracture -05/01/2018 ORIF left hip -PT evaluation -Tramadol PRN pain  Thrombocytopenia -has been chronic intermittent -drop due to acute illness -serum T24--580 -serum folic DXIP--38   Disposition Plan:   SNF 2-3 days  Family Communication:   No Family at bedside  Consultants: none    Code Status:  FULL   DVT Prophylaxis:  [place] Lovenox   Procedures: As Listed in Progress Note Above  Antibiotics: vanco 3/1 Cefepime 3/2>>      Subjective: Pt is breathing better.  Patient denies fevers, chills, headache, chest pain, nausea, vomiting, diarrhea, abdominal pain, dysuria, hematuria, hematochezia, and melena. She remains sob with minimal movement  Objective: Vitals:   06/22/18 0713  06/22/18 0718 06/22/18 1444 06/22/18 1500  BP:   99/61   Pulse:   94   Resp:   18   Temp:   97.9 F (36.6 C)   TempSrc:   Oral   SpO2: 100% 100%  98% 98%  Weight:      Height:        Intake/Output Summary (Last 24 hours) at 06/22/2018 1543 Last data filed at 06/22/2018 0600 Gross per 24 hour  Intake 580 ml  Output 1300 ml  Net -720 ml   Weight change: 1.978 kg Exam:   General:  Pt is alert, follows commands appropriately, not in acute distress  HEENT: No icterus, No thrush, No neck mass, [place]/AT  Cardiovascular: RRR, S1/S2, no rubs, no gallops  Respiratory: bibasilar rales, R>L, no wheeze  Abdomen: Soft/+BS, non tender, non distended, no guarding  Extremities: 2+LE edema, No lymphangitis, No petechiae, No rashes, no synovitis   Data Reviewed: I have personally reviewed following labs and imaging studies Basic Metabolic Panel: Recent Labs  Lab 06/20/18 0855 06/21/18 0542 06/22/18 0531  NA 143 143 145  K 4.0 3.5 3.9  CL 100 97* 98  CO2 36* 36* 38*  GLUCOSE 125* 113* 172*  BUN 24* 23 30*  CREATININE 0.58 0.60 0.85  CALCIUM 8.6* 8.6* 8.6*  MG  --   --  1.8   Liver Function Tests: Recent Labs  Lab 06/20/18 0855  AST 17  ALT 10  ALKPHOS 57  BILITOT 0.8  PROT 5.7*  ALBUMIN 2.7*   No results for input(s): LIPASE, AMYLASE in the last 168 hours. No results for input(s): AMMONIA in the last 168 hours. Coagulation Profile: No results for input(s): INR, PROTIME in the last 168 hours. CBC: Recent Labs  Lab 06/20/18 0855 06/21/18 0542 06/22/18 0531  WBC 7.1 4.2 3.0*  NEUTROABS 5.0  --  2.2  HGB 9.2* 9.8* 9.4*  HCT 31.3* 32.5* 31.2*  MCV 104.3* 103.5* 103.7*  PLT 163 154 154   Cardiac Enzymes: Recent Labs  Lab 06/20/18 0855 06/20/18 1329 06/20/18 2022  TROPONINI 0.15* 0.14* 0.11*   BNP: Invalid input(s): POCBNP CBG: No results for input(s): GLUCAP in the last 168 hours. HbA1C: No results for input(s): HGBA1C in the last 72 hours. Urine analysis:    Component Value Date/Time   COLORURINE AMBER (A) 05/05/2018 1446   APPEARANCEUR CLOUDY (A) 05/05/2018 1446   LABSPEC 1.028 05/05/2018 1446    PHURINE 5.0 05/05/2018 1446   GLUCOSEU NEGATIVE 05/05/2018 1446   HGBUR MODERATE (A) 05/05/2018 1446   BILIRUBINUR NEGATIVE 05/05/2018 1446   KETONESUR NEGATIVE 05/05/2018 1446   PROTEINUR 30 (A) 05/05/2018 1446   UROBILINOGEN 0.2 05/11/2013 1548   NITRITE NEGATIVE 05/05/2018 1446   LEUKOCYTESUR MODERATE (A) 05/05/2018 1446   Sepsis Labs: @LABRCNTIP (procalcitonin:4,lacticidven:4) ) Recent Results (from the past 240 hour(s))  MRSA PCR Screening     Status: None   Collection Time: 06/20/18 11:40 AM  Result Value Ref Range Status   MRSA by PCR NEGATIVE NEGATIVE Final    Comment:        The GeneXpert MRSA Assay (FDA approved for NASAL specimens only), is one component of a comprehensive MRSA colonization surveillance program. It is not intended to diagnose MRSA infection nor to guide or monitor treatment for MRSA infections. Performed at [facility], [address] [place], [place] [address]   Respiratory Panel by PCR     Status: None   Collection Time: 06/21/18 10:15 AM  Result Value Ref  Range Status   Adenovirus NOT DETECTED NOT DETECTED Final   Coronavirus 229E NOT DETECTED NOT DETECTED Final    Comment: (NOTE) The Coronavirus on the Respiratory Panel, DOES NOT test for the novel  Coronavirus (2019 nCoV)    Coronavirus HKU1 NOT DETECTED NOT DETECTED Final   Coronavirus NL63 NOT DETECTED NOT DETECTED Final   Coronavirus OC43 NOT DETECTED NOT DETECTED Final   Metapneumovirus NOT DETECTED NOT DETECTED Final   Rhinovirus / Enterovirus NOT DETECTED NOT DETECTED Final   Influenza A NOT DETECTED NOT DETECTED Final   Influenza B NOT DETECTED NOT DETECTED Final   Parainfluenza Virus 1 NOT DETECTED NOT DETECTED Final   Parainfluenza Virus 2 NOT DETECTED NOT DETECTED Final   Parainfluenza Virus 3 NOT DETECTED NOT DETECTED Final   Parainfluenza Virus 4 NOT DETECTED NOT DETECTED Final   Respiratory Syncytial Virus NOT DETECTED NOT DETECTED Final   Bordetella pertussis NOT  DETECTED NOT DETECTED Final   Chlamydophila pneumoniae NOT DETECTED NOT DETECTED Final   Mycoplasma pneumoniae NOT DETECTED NOT DETECTED Final    Comment: Performed at [facility], [place] [address], [place], [place] [address]     Scheduled Meds: . budesonide (PULMICORT) nebulizer solution  0.5 mg Nebulization BID  . docusate sodium  100 mg Oral BID  . enoxaparin (LOVENOX) injection  30 mg Subcutaneous Q24H  . ferrous sulfate  325 mg Oral Q breakfast  . furosemide  40 mg Intravenous BID  . GLUCERNA  237 mL Oral Daily  . guaiFENesin  20 mL Oral TID  . ipratropium-albuterol  3 mL Nebulization Q6H  . mouth rinse  15 mL Mouth Rinse BID  . methylPREDNISolone (SOLU-MEDROL) injection  60 mg Intravenous TID  . pantoprazole  40 mg Oral Daily  . potassium chloride  20 mEq Oral Daily  . sodium chloride flush  3 mL Intravenous Q12H   Continuous Infusions: . sodium chloride    . ceFEPime (MAXIPIME) IV 1 g (06/22/18 1452)    Procedures/Studies: Dg Chest 2 View  Result Date: 06/20/2018 CLINICAL DATA:  Shortness of breath and productive cough for 1 week. EXAM: CHEST - 2 VIEW COMPARISON:  05/03/2018 FINDINGS: Stable mild cardiomegaly and ectasia of the thoracic aorta. Aortic atherosclerosis. New right lower lobe opacity and small right pleural effusion, suspicious for pneumonia. Left lung remains clear. IMPRESSION: New right lower lobe opacity and small right pleural effusion, suspicious for pneumonia. Recommend chest radiographic follow-up to ensure resolution. Electronically Signed   By: Earle Gell M.D.   On: 06/20/2018 10:05   Dg Chest Port 1 View  Result Date: 06/21/2018 CLINICAL DATA:  Labored breathing EXAM: PORTABLE CHEST 1 VIEW COMPARISON:  06/20/2018, CT 05/03/2018, radiograph 04/30/2018 FINDINGS: Bilateral pleural effusions, right greater than left with continued airspace disease at the right lung base. Stable cardiomediastinal silhouette with aortic atherosclerosis. No pneumothorax.  IMPRESSION: 1. Similar appearance of right greater than left pleural effusion with continued airspace disease at the right base which may reflect atelectasis or pneumonia Electronically Signed   By: Donavan Foil M.D.   On: 06/21/2018 02:04   Xr Hip Unilat W Or W/o Pelvis 2-3 Views Left  Result Date: 06/17/2018 AP lateral left hip obtained and reviewed this shows trochanteric nail with interval healing.  No evidence of loosening or subsidence. Impression: Satisfactory postop ORIF left hip fracture with truck nail.   Orson Jomarie, DO  [facility] Pager [phone]  If 7PM-7AM, please contact night-coverage [email or web address] Password [facility] 06/22/2018, 3:43 PM  LOS: 2 days

## 2018-06-22 NOTE — Progress Notes (Signed)
I placed pt on BIPAP at 2323 for bedtime and came in to do breathing treatment at 0224 pt off BIPAP and back on 10lpm HFNC spo2 89% will increase o2 to 15lpm cann per previous settings

## 2018-06-22 NOTE — Evaluation (Signed)
Clinical/Bedside Swallow Evaluation Patient Details  Name: Hannah Arias MRN: [id number] Date of Birth: Mar 19, 1911  Today's Date: 06/22/2018 Time: SLP Start Time (ACUTE ONLY): 1330 SLP Stop Time (ACUTE ONLY): 1415 SLP Time Calculation (min) (ACUTE ONLY): 45 min  Past Medical History:  Past Medical History:  Diagnosis Date  . Chronic anemia 05/01/2018  . Chronic diastolic heart failure (HCC) 06/12/2018  . CKD (chronic kidney disease), stage III (HCC) 05/01/2018  . Closed fracture of olecranon process of ulna 11/07/2009   Qualifier: Diagnosis of  By: Kandis Ban, Chasity L   . Constipation 05/01/2018  . CTS (carpal tunnel syndrome) 03/04/2011  . GERD (gastroesophageal reflux disease)   . RUPTURE ROTATOR CUFF 03/19/2010   Qualifier: Diagnosis of  By: Romeo Apple MD, Duffy Rhody     Past Surgical History:  Past Surgical History:  Procedure Laterality Date  . APPENDECTOMY     85 yr old  . BREAST LUMPECTOMY    . CATARACT EXTRACTION    . ELBOW SURGERY    . HEMORROIDECTOMY    . HERNIA REPAIR    . INTRAMEDULLARY (IM) NAIL INTERTROCHANTERIC Left 05/01/2018   Procedure: LEFT BIOMET AFFIXUS (IM) SHORT NAIL, INTERTROCHANTRIC FRACTURE;  Surgeon: Eldred Manges, MD;  Location: [facility] OR;  Service: Orthopedics;  Laterality: Left;  . SHOULDER SURGERY     left rotator cuff  . TONSILLECTOMY AND ADENOIDECTOMY     11 yr  . tubal removal     HPI:  85 y.o.femalewith medical history ofdiastolic CHF, GERD, and recent left hip fracture presenting with 1 to 2-day history of worsening shortness of breath. Review the medical record shows that the patient has had some shortness of breath for approximately 1 week. She denies any fevers, chills, chest pain but complains of a nonproductive cough and chest congestion.  Patient was recently admitted to the hospital from 04/30/2018 through 05/07/2018 secondary to a left hip fracture from mechanical fall. She underwent ORIF of the left hip on 05/01/2018, and she was  discharged to the [facility] nursing for rehab. Since she has been at the [facility], it is been noted that she has had some hypoxia and worsening lower extremity edema. Notably, the patient had her furosemide increased by her primary provider on 06/09/2018 from 20 mg daily to 40 mg daily. During that office visit, the patient had a weight of 171 pounds. However, it was noted on 05/28/2018 during a medical visit, the patient had a weight of 163 pounds. Prior to her admission for her left hip fracture, the patient did not require any supplemental oxygen. However since she has transitioned to the [facility] she has been placed on 2 L nasal cannula secondary to hypoxia and shortness of breath.  The patient does note some occasional orthopnea type symptoms with worsening lower extremity edema. BSE requested   Assessment / Plan / Recommendation Clinical Impression  Pt presents with mild oral phase dysphagia characterized by impaired mastication and reduced lingual manipulation with solids resulting in prolonged oral transit and min oral residuals post swallow. Pt with occasional delayed cough during the beginning of her meal, however this decreased after cues to avoid talking while eating. SLP fed Pt her lunch meal of spaghetti, green beans, garlic bread, and ice tea per Pt request for assist for feeding. Pt received a regular texture tray and SLP cut food into bite sized pieces for increased efficiency of mastication. Recommend D3/mech soft and thin liquids with 1:1 feeder assist, encourage Pt to avoid talking while  masticating, use lingual sweep to clear oral cavity, and take small bites/sips. SLP will follow for diet tolerance and Pt/family education during acute stay. The Pt tells me that she "cannot eat what they served" her at [facility] (puree). SLP will follow.   SLP Visit Diagnosis: Dysphagia, oral phase (R13.11)    Aspiration Risk  Mild aspiration risk    Diet Recommendation Dysphagia 3 (Mech  soft);Thin liquid   Liquid Administration via: Cup;Straw Medication Administration: Whole meds with liquid Supervision: Staff to assist with self feeding;Full supervision/cueing for compensatory strategies Compensations: Slow rate;Small sips/bites;Lingual sweep for clearance of pocketing Postural Changes: Seated upright at 90 degrees;Remain upright for at least 30 minutes after po intake    Other  Recommendations Oral Care Recommendations: Oral care BID;Staff/trained caregiver to provide oral care Other Recommendations: Clarify dietary restrictions   Follow up Recommendations 24 hour supervision/assistance      Frequency and Duration min 2x/week  1 week       Prognosis Prognosis for Safe Diet Advancement: Good      Swallow Study   General Date of Onset: 06/20/18 HPI: 85 y.o.femalewith medical history ofdiastolic CHF, GERD, and recent left hip fracture presenting with 1 to 2-day history of worsening shortness of breath. Review the medical record shows that the patient has had some shortness of breath for approximately 1 week. She denies any fevers, chills, chest pain but complains of a nonproductive cough and chest congestion.  Patient was recently admitted to the hospital from 04/30/2018 through 05/07/2018 secondary to a left hip fracture from mechanical fall. She underwent ORIF of the left hip on 05/01/2018, and she was discharged to the [facility] nursing for rehab. Since she has been at the [facility], it is been noted that she has had some hypoxia and worsening lower extremity edema. Notably, the patient had her furosemide increased by her primary provider on 06/09/2018 from 20 mg daily to 40 mg daily. During that office visit, the patient had a weight of 171 pounds. However, it was noted on 05/28/2018 during a medical visit, the patient had a weight of 163 pounds. Prior to her admission for her left hip fracture, the patient did not require any supplemental oxygen. However  since she has transitioned to the [facility], she has been placed on 2 L nasal cannula secondary to hypoxia and shortness of breath.  The patient does note some occasional orthopnea type symptoms with worsening lower extremity edema. BSE requested Type of Study: Bedside Swallow Evaluation Diet Prior to this Study: Regular;Thin liquids Temperature Spikes Noted: No Respiratory Status: Nasal cannula History of Recent Intubation: No Behavior/Cognition: Alert;Cooperative;Pleasant mood Oral Cavity Assessment: Within Functional Limits Oral Care Completed by SLP: Yes Oral Cavity - Dentition: Dentures, top;Dentures, bottom Vision: Impaired for self-feeding Self-Feeding Abilities: Total assist Patient Positioning: Upright in bed Baseline Vocal Quality: Normal Volitional Cough: Strong;Congested Volitional Swallow: Able to elicit    Oral/Motor/Sensory Function Overall Oral Motor/Sensory Function: Within functional limits   Ice Chips Ice chips: Within functional limits Presentation: Spoon   Thin Liquid Thin Liquid: Within functional limits Presentation: Cup;Straw    Nectar Thick Nectar Thick Liquid: Not tested   Honey Thick Honey Thick Liquid: Not tested   Puree Puree: Within functional limits Presentation: Spoon   Solid     Solid: Impaired Oral Phase Impairments: Impaired mastication Oral Phase Functional Implications: Prolonged oral transit;Oral residue Pharyngeal Phase Impairments: Cough - Delayed     Thank you,  Havery Moros, CCC-SLP [phone]  Ariq Khamis 06/22/2018,2:24 PM

## 2018-06-23 ENCOUNTER — Inpatient Hospital Stay (HOSPITAL_COMMUNITY): Payer: Medicare HMO

## 2018-06-23 LAB — BASIC METABOLIC PANEL
ANION GAP: 7 (ref 5–15)
BUN: 41 mg/dL — ABNORMAL HIGH (ref 8–23)
CO2: 39 mmol/L — ABNORMAL HIGH (ref 22–32)
Calcium: 8.4 mg/dL — ABNORMAL LOW (ref 8.9–10.3)
Chloride: 97 mmol/L — ABNORMAL LOW (ref 98–111)
Creatinine, Ser: 0.82 mg/dL (ref 0.44–1.00)
GFR calc Af Amer: >60 mL/min (ref >60)
GFR calc non Af Amer: 57 mL/min — ABNORMAL LOW (ref >60)
Glucose, Bld: 172 mg/dL — ABNORMAL HIGH (ref 70–99)
Potassium: 4.4 mmol/L (ref 3.5–5.1)
Sodium: 143 mmol/L (ref 135–145)

## 2018-06-23 MED ORDER — ENOXAPARIN SODIUM 40 MG/0.4ML ~~LOC~~ SOLN
40.0000 mg | SUBCUTANEOUS | Status: DC
  Administered 2018-06-23 – 2018-06-28 (×6): 40 mg via SUBCUTANEOUS
  Filled 2018-06-23 (×6): qty 0.4

## 2018-06-23 NOTE — Progress Notes (Signed)
PROGRESS NOTE  Hannah Arias YQM:[id number] DOB: 1910/09/04 DOA: 06/20/2018 PCP: Celene Squibb, MD  Brief History:  85 y.o.femalewith medical history ofdiastolic CHF, GERD, and recent left hip fracture presenting with 1 to 2-day history of worsening shortness of breath. Review the medical record shows that the patient has had some shortness of breath for approximately 1 week. She denies any fevers, chills, chest pain but complains of a nonproductive cough and chest congestion.  Patient was recently admitted to the hospital from 04/30/2018 through 05/07/2018 secondary to a left hip fracture from mechanical fall. She underwent ORIF of the left hip on 05/01/2018, and she was discharged to the [place] for rehab. Since she has been at the [facility], it is been noted that she has had some hypoxia and worsening lower extremity edema. Notably, the patient had her furosemide increased by her primary provider on 06/09/2018 from 20 mg daily to 40 mg daily. During that office visit, the patient had a weight of 171 pounds. However, it was noted on 05/28/2018 during a medical visit, the patient had a weight of 163 pounds. Prior to her admission for her left hip fracture, the patient did not require any supplemental oxygen. However since she has transitioned to the [facility], she has been placed on 2 L nasal cannula secondary to hypoxia and shortness of breath.  The patient does note some occasional orthopnea type symptoms with worsening lower extremity edema. In the emergency department, the patient was afebrile and hemodynamically stable saturating 94-95% on 2 L. BMP, LFTs, CBC were essentially unremarkable.Hemoglobin was 9.2 which is at her usual baseline. BMP was 434.0. Troponin was 0.15. Chest x-ray showed right lower lobe opacity with increased interstitial markings. The patient was started on vancomycin and cefepime.  In the early am 06/21/18, pt had respiratory distress  requiring BiPAP for short period of time.  Repeat cxr did not show much change from 06/20/18.  Assessment/Plan: Acute on chronic diastolic CHF -continue IV diuresis  -continue daily weight and acute I's and O's -[id number]--OZ36-64%, +diastolic dysfx, RVSP 40.3 -low sodium diet education provided  Acute on Chronic respiratory failure with hypoxia -secondary to CHF and pneumonia -Patient is chronically on 2 L nasal cannula -pt required BiPAP early am 06/21/18-->now back to [facility]NC 8L -continue diuresis and IV antibiotics for PNA.  Elevated troponin -Patient has no chest pain -trend is flat and representing demand ischemia -personally reviewed EKG--sinus, no STT changes  Lobar pneumonia/HCAP -Continue cefepime -06/18/2018 influenza PCR negative -continue duonebs and pulmicort -continue steroids.  -viral respiratory panel--neg  GERD -Continue PPI  Recent left hip fracture -05/01/2018 ORIF left hip -PT evaluation -continue Tramadol PRN pain -back to SNF for physical rehab once stable.   Thrombocytopenia -has been chronic intermittent -drop due to acute illness -serum K74--259 -serum folic DGLO--75 -follow platelets trend.   Disposition Plan:   SNF 2-3 days  Family Communication:   No Family at bedside  Consultants: none    Code Status:  FULL   DVT Prophylaxis:  [place] Lovenox   Procedures: As Listed in Progress Note Above  Antibiotics: vanco 3/1>>3/2 Cefepime 3/2>>   Subjective: Still SOB, no CP, no nausea, no vomiting. Afebrile. Requiring high O2 supplementation.   Objective: Vitals:   06/23/18 0426 06/23/18 0500 06/23/18 1342 06/23/18 1430  BP: (!) 129/91   (!) 130/56  Pulse: 91   99  Resp: 20   18  Temp: 98.4 F (36.9  C)   98 F (36.7 C)  TempSrc: Oral   Oral  SpO2: 97%  100% 96%  Weight:  76 kg    Height:        Intake/Output Summary (Last 24 hours) at 06/23/2018 1915 Last data filed at 06/23/2018 1858 Gross per 24 hour  Intake 823  ml  Output 1000 ml  Net -177 ml   Weight change: 1.9 kg   Exam: General exam: Alert, awake, oriented x 2. Very hard of hearing. No CP. Still SOB and with signs of fluid overload. Respiratory system: mild exp wheezing, positive rhonchi, positive bibasilar crackles.  Cardiovascular system:RRR. No murmurs, rubs, gallops. Gastrointestinal system: Abdomen is nondistended, soft and nontender. No organomegaly or masses felt. Normal bowel sounds heard. Central nervous system: Alert and oriented. No focal neurological deficits. Extremities: No Cyanosis, no clubbing; 2++ edema bilaterally. Skin: No rashes, lesions or ulcers Psychiatry: Mood & affect appropriate.    Data Reviewed: I have personally reviewed following labs and imaging studies  Basic Metabolic Panel: Recent Labs  Lab 06/20/18 0855 06/21/18 0542 06/22/18 0531 06/23/18 0538  NA 143 143 145 143  K 4.0 3.5 3.9 4.4  CL 100 97* 98 97*  CO2 36* 36* 38* 39*  GLUCOSE 125* 113* 172* 172*  BUN 24* 23 30* 41*  CREATININE 0.58 0.60 0.85 0.82  CALCIUM 8.6* 8.6* 8.6* 8.4*  MG  --   --  1.8  --    Liver Function Tests: Recent Labs  Lab 06/20/18 0855  AST 17  ALT 10  ALKPHOS 57  BILITOT 0.8  PROT 5.7*  ALBUMIN 2.7*   CBC: Recent Labs  Lab 06/20/18 0855 06/21/18 0542 06/22/18 0531  WBC 7.1 4.2 3.0*  NEUTROABS 5.0  --  2.2  HGB 9.2* 9.8* 9.4*  HCT 31.3* 32.5* 31.2*  MCV 104.3* 103.5* 103.7*  PLT 163 154 154   Cardiac Enzymes: Recent Labs  Lab 06/20/18 0855 06/20/18 1329 06/20/18 2022  TROPONINI 0.15* 0.14* 0.11*   Urine analysis:    Component Value Date/Time   COLORURINE AMBER (A) 05/05/2018 1446   APPEARANCEUR CLOUDY (A) 05/05/2018 1446   LABSPEC 1.028 05/05/2018 1446   PHURINE 5.0 05/05/2018 1446   GLUCOSEU NEGATIVE 05/05/2018 1446   HGBUR MODERATE (A) 05/05/2018 1446   BILIRUBINUR NEGATIVE 05/05/2018 1446   KETONESUR NEGATIVE 05/05/2018 1446   PROTEINUR 30 (A) 05/05/2018 1446   UROBILINOGEN 0.2  05/11/2013 1548   NITRITE NEGATIVE 05/05/2018 1446   LEUKOCYTESUR MODERATE (A) 05/05/2018 1446    Recent Results (from the past 240 hour(s))  MRSA PCR Screening     Status: None   Collection Time: 06/20/18 11:40 AM  Result Value Ref Range Status   MRSA by PCR NEGATIVE NEGATIVE Final    Comment:        The GeneXpert MRSA Assay (FDA approved for NASAL specimens only), is one component of a comprehensive MRSA colonization surveillance program. It is not intended to diagnose MRSA infection nor to guide or monitor treatment for MRSA infections. Performed at [facility], [address] [place], [place] [address]   Respiratory Panel by PCR     Status: None   Collection Time: 06/21/18 10:15 AM  Result Value Ref Range Status   Adenovirus NOT DETECTED NOT DETECTED Final   Coronavirus 229E NOT DETECTED NOT DETECTED Final    Comment: (NOTE) The Coronavirus on the Respiratory Panel, DOES NOT test for the novel  Coronavirus (2019 nCoV)    Coronavirus HKU1 NOT DETECTED  NOT DETECTED Final   Coronavirus NL63 NOT DETECTED NOT DETECTED Final   Coronavirus OC43 NOT DETECTED NOT DETECTED Final   Metapneumovirus NOT DETECTED NOT DETECTED Final   Rhinovirus / Enterovirus NOT DETECTED NOT DETECTED Final   Influenza A NOT DETECTED NOT DETECTED Final   Influenza B NOT DETECTED NOT DETECTED Final   Parainfluenza Virus 1 NOT DETECTED NOT DETECTED Final   Parainfluenza Virus 2 NOT DETECTED NOT DETECTED Final   Parainfluenza Virus 3 NOT DETECTED NOT DETECTED Final   Parainfluenza Virus 4 NOT DETECTED NOT DETECTED Final   Respiratory Syncytial Virus NOT DETECTED NOT DETECTED Final   Bordetella pertussis NOT DETECTED NOT DETECTED Final   Chlamydophila pneumoniae NOT DETECTED NOT DETECTED Final   Mycoplasma pneumoniae NOT DETECTED NOT DETECTED Final    Comment: Performed at [facility], [place] [address], [place], [place] [address]     Scheduled Meds: . budesonide (PULMICORT) nebulizer solution   0.5 mg Nebulization BID  . docusate sodium  100 mg Oral BID  . enoxaparin (LOVENOX) injection  40 mg Subcutaneous Q24H  . ferrous sulfate  325 mg Oral Q breakfast  . furosemide  40 mg Intravenous BID  . GLUCERNA  237 mL Oral Daily  . guaiFENesin  20 mL Oral TID  . ipratropium-albuterol  3 mL Nebulization Q6H  . mouth rinse  15 mL Mouth Rinse BID  . methylPREDNISolone (SOLU-MEDROL) injection  60 mg Intravenous TID  . pantoprazole  40 mg Oral Daily  . potassium chloride  20 mEq Oral Daily  . sodium chloride flush  3 mL Intravenous Q12H   Continuous Infusions: . sodium chloride    . ceFEPime (MAXIPIME) IV 1 g (06/23/18 1220)    Procedures/Studies: Dg Chest 2 View  Result Date: 06/20/2018 CLINICAL DATA:  Shortness of breath and productive cough for 1 week. EXAM: CHEST - 2 VIEW COMPARISON:  05/03/2018 FINDINGS: Stable mild cardiomegaly and ectasia of the thoracic aorta. Aortic atherosclerosis. New right lower lobe opacity and small right pleural effusion, suspicious for pneumonia. Left lung remains clear. IMPRESSION: New right lower lobe opacity and small right pleural effusion, suspicious for pneumonia. Recommend chest radiographic follow-up to ensure resolution. Electronically Signed   By: Earle Gell M.D.   On: 06/20/2018 10:05   Dg Chest Port 1 View  Result Date: 06/23/2018 CLINICAL DATA:  Difficulty breathing EXAM: PORTABLE CHEST 1 VIEW COMPARISON:  06/21/2018 FINDINGS: Cardiac shadow is stable. Aortic calcifications and tortuosity are again seen. Stable scoliosis is noted. Increasing consolidation in the right base is noted with right-sided pleural effusion. Persistent density in the right paratracheal region is seen related to tortuous vascularity IMPRESSION: Increasing right basilar consolidation and effusion. Electronically Signed   By: Inez Catalina M.D.   On: 06/23/2018 07:39   Dg Chest Port 1 View  Result Date: 06/21/2018 CLINICAL DATA:  Labored breathing EXAM: PORTABLE CHEST 1 VIEW  COMPARISON:  06/20/2018, CT 05/03/2018, radiograph 04/30/2018 FINDINGS: Bilateral pleural effusions, right greater than left with continued airspace disease at the right lung base. Stable cardiomediastinal silhouette with aortic atherosclerosis. No pneumothorax. IMPRESSION: 1. Similar appearance of right greater than left pleural effusion with continued airspace disease at the right base which may reflect atelectasis or pneumonia Electronically Signed   By: Donavan Foil M.D.   On: 06/21/2018 02:04   Xr Hip Unilat W Or W/o Pelvis 2-3 Views Left  Result Date: 06/17/2018 AP lateral left hip obtained and reviewed this shows trochanteric nail with interval healing.  No evidence of loosening or subsidence. Impression: Satisfactory postop ORIF left hip fracture with truck nail.   Barton Dubois, MD  [facility] Pager [phone]   06/23/2018, 7:15 PM   LOS: 3 days

## 2018-06-23 NOTE — Progress Notes (Signed)
Pharmacy Antibiotic Note  Hannah Arias is a 85 y.o. female admitted on 06/20/2018 with pneumonia.  Pharmacy has been consulted for Cefepime dosing. Afebrile, has had increased bronchospasm.  Plan: Continue cefepime 1gm IV q24h F/U cxs and clinical progress, LOT Monitor V/S, labs  Height: 5\' 3"  (160 cm) Weight: 167 lb 8.8 oz (76 kg) IBW/kg (Calculated) : 52.4  Temp (24hrs), Avg:98 F (36.7 C), Min:97.6 F (36.4 C), Max:98.4 F (36.9 C)  Recent Labs  Lab 06/20/18 0855 06/21/18 0542 06/22/18 0531 06/23/18 0538  WBC 7.1 4.2 3.0*  --   CREATININE 0.58 0.60 0.85 0.82    Estimated Creatinine Clearance: 31.1 mL/min (by C-G formula based on SCr of 0.82 mg/dL).    Allergies  Allergen Reactions  . Aspirin     Stomach problem  . Codeine     hallucinate  . Penicillins Rash    Has patient had a PCN reaction causing immediate rash, facial/tongue/throat swelling, SOB or lightheadedness with hypotension: Yes Has patient had a PCN reaction causing severe rash involving mucus membranes or skin necrosis: No Has patient had a PCN reaction that required hospitalization No Has patient had a PCN reaction occurring within the last 10 years: No If all of the above answers are "NO", then may proceed with Cephalosporin use.    Antimicrobials this admission:  cefepime 3/1 >>   vancomycin 3/1 >> 3/1    Microbiology results:   3/1 MRSA PCR: negative    2/28 Flu A/B: negative  Thank you for allowing pharmacy to be a part of this patient's care.  Isac Sarna, BS [place], [place] Clinical Pharmacist Pager [phone] 06/23/2018 12:13 PM

## 2018-06-23 NOTE — Progress Notes (Signed)
  Speech Language Pathology Treatment: Dysphagia  Patient Details Name: Hannah Arias MRN: [id number] DOB: 1911/03/23 Today's Date: 06/23/2018 Time: 9628-3662 SLP Time Calculation (min) (ACUTE ONLY): 18 min  Assessment / Plan / Recommendation Clinical Impression  Pt seen for ongoing diagnostic dysphagia intervention following clinical swallow evaluation completed yesterday. Pt was fed her lunch meal by nursing staff with reported good tolerance (some coughing at breakfast). Pt consumed hard pieces of canned pear with SLP and noted to have prolonged mastication and min residuals- Pt aware of the same. Recommend continue diet as ordered D3/mech soft and thin with 1:1 feeder assist. SLP will follow during acute stay.   HPI HPI: 85 y.o.femalewith medical history ofdiastolic CHF, GERD, and recent left hip fracture presenting with 1 to 2-day history of worsening shortness of breath. Review the medical record shows that the patient has had some shortness of breath for approximately 1 week. She denies any fevers, chills, chest pain but complains of a nonproductive cough and chest congestion.  Patient was recently admitted to the hospital from 04/30/2018 through 05/07/2018 secondary to a left hip fracture from mechanical fall. She underwent ORIF of the left hip on 05/01/2018, and she was discharged to the [facility] for rehab. Since she has been at the [facility], it is been noted that she has had some hypoxia and worsening lower extremity edema. Notably, the patient had her furosemide increased by her primary provider on 06/09/2018 from 20 mg daily to 40 mg daily. During that office visit, the patient had a weight of 171 pounds. However, it was noted on 05/28/2018 during a medical visit, the patient had a weight of 163 pounds. Prior to her admission for her left hip fracture, the patient did not require any supplemental oxygen. However since she has transitioned to the [facility], she has been  placed on 2 L nasal cannula secondary to hypoxia and shortness of breath.  The patient does note some occasional orthopnea type symptoms with worsening lower extremity edema. BSE requested      SLP Plan  Continue with current plan of care       Recommendations  Diet recommendations: Dysphagia 3 (mechanical soft);Thin liquid Liquids provided via: Cup Medication Administration: Whole meds with liquid Supervision: Staff to assist with self feeding;Full supervision/cueing for compensatory strategies Compensations: Slow rate;Small sips/bites;Lingual sweep for clearance of pocketing Postural Changes and/or Swallow Maneuvers: Seated upright 90 degrees;Upright 30-60 min after meal                Oral Care Recommendations: Oral care BID;Staff/trained caregiver to provide oral care Follow up Recommendations: 24 hour supervision/assistance SLP Visit Diagnosis: Dysphagia, oral phase (R13.11) Plan: Continue with current plan of care       Thank you,  Genene Churn, [place]                 [place] 06/23/2018, 3:57 PM

## 2018-06-23 NOTE — Care Management (Signed)
From SNF. CM consulted for HF. CSW following. CM will sign off.

## 2018-06-24 ENCOUNTER — Encounter (HOSPITAL_COMMUNITY)
Admission: RE | Admit: 2018-06-24 | Discharge: 2018-06-24 | Disposition: A | Discharge status: Home / Self Care | Payer: Medicare HMO | Source: Skilled Nursing Facility | Attending: Internal Medicine | Admitting: Internal Medicine

## 2018-06-24 DIAGNOSIS — Z9981 Dependence on supplemental oxygen: Secondary | ICD-10-CM | POA: Insufficient documentation

## 2018-06-24 DIAGNOSIS — Z4789 Encounter for other orthopedic aftercare: Secondary | ICD-10-CM | POA: Insufficient documentation

## 2018-06-24 DIAGNOSIS — I10 Essential (primary) hypertension: Secondary | ICD-10-CM | POA: Insufficient documentation

## 2018-06-24 DIAGNOSIS — Z9181 History of falling: Secondary | ICD-10-CM | POA: Insufficient documentation

## 2018-06-24 DIAGNOSIS — D649 Anemia, unspecified: Secondary | ICD-10-CM | POA: Insufficient documentation

## 2018-06-24 MED ORDER — SODIUM CHLORIDE 0.9 % IV SOLN
1.0000 g | INTRAVENOUS | Status: AC
  Administered 2018-06-24 – 2018-06-25 (×2): 1 g via INTRAVENOUS
  Filled 2018-06-24 (×3): qty 1

## 2018-06-24 MED ORDER — METOLAZONE 5 MG PO TABS
2.5000 mg | ORAL_TABLET | Freq: Every day | ORAL | Status: AC
  Administered 2018-06-24 – 2018-06-25 (×2): 2.5 mg via ORAL
  Filled 2018-06-24 (×2): qty 1

## 2018-06-24 MED ORDER — RESOURCE THICKENUP CLEAR PO POWD
ORAL | Status: DC | PRN
  Filled 2018-06-24: qty 125

## 2018-06-24 NOTE — Progress Notes (Signed)
MD discontinued BIPAP order earlier today so unit was pulled out of room and cleaned. Circuit left in room if needed.

## 2018-06-24 NOTE — Progress Notes (Signed)
  Speech Language Pathology Treatment: Dysphagia  Patient Details Name: Hannah Arias MRN: [id number] DOB: 1910/08/31 Today's Date: 06/24/2018 Time: 6213-0865 SLP Time Calculation (min) (ACUTE ONLY): 24 min  Assessment / Plan / Recommendation Clinical Impression  Pt seen at bedside with friends visiting (they attend her church). Pt was alert, cooperative, and willing to drink more liquids with SLP (had just finished lunch). Pt with congested cough which increased following thin liquids via straw sips. Pt is very HOH, but seemed to have greater difficulty with communication today. SLP aided communication with written cues and lip reading. Pt further assessed with NTL and coughing diminished except for one cough over 90cc. Will downgrade to NTL for now and complete MBSS tomorrow to objectively evaluate swallow. Given Pt's advanced age, care should be given to address her personal wishes however she is agreeable to COLD nectar-thickened liquids at this time. MBSS can help provide additional information for decisions.     HPI HPI: 85 y.o.femalewith medical history ofdiastolic CHF, GERD, and recent left hip fracture presenting with 1 to 2-day history of worsening shortness of breath. Review the medical record shows that the patient has had some shortness of breath for approximately 1 week. She denies any fevers, chills, chest pain but complains of a nonproductive cough and chest congestion.  Patient was recently admitted to the hospital from 04/30/2018 through 05/07/2018 secondary to a left hip fracture from mechanical fall. She underwent ORIF of the left hip on 05/01/2018, and she was discharged to the [facility] for rehab. Since she has been at the [facility], it is been noted that she has had some hypoxia and worsening lower extremity edema. Notably, the patient had her furosemide increased by her primary provider on 06/09/2018 from 20 mg daily to 40 mg daily. During that office visit, the  patient had a weight of 171 pounds. However, it was noted on 05/28/2018 during a medical visit, the patient had a weight of 163 pounds. Prior to her admission for her left hip fracture, the patient did not require any supplemental oxygen. However since she has transitioned to the [facility], she has been placed on 2 L nasal cannula secondary to hypoxia and shortness of breath.  The patient does note some occasional orthopnea type symptoms with worsening lower extremity edema. BSE requested      SLP Plan  Continue with current plan of care;MBS       Recommendations  Diet recommendations: Dysphagia 3 (mechanical soft);Nectar-thick liquid Liquids provided via: Cup Medication Administration: Whole meds with liquid Supervision: Staff to assist with self feeding;Full supervision/cueing for compensatory strategies Compensations: Slow rate;Small sips/bites;Lingual sweep for clearance of pocketing Postural Changes and/or Swallow Maneuvers: Seated upright 90 degrees;Upright 30-60 min after meal                Oral Care Recommendations: Oral care BID;Staff/trained caregiver to provide oral care Follow up Recommendations: 24 hour supervision/assistance;Skilled Nursing facility SLP Visit Diagnosis: Dysphagia, oral phase (R13.11) Plan: Continue with current plan of care;MBS       Thank you,  Genene Churn, [place]                 [place] 06/24/2018, 2:19 PM

## 2018-06-24 NOTE — Plan of Care (Signed)
Palliative care team unable to see patient today.  Palliative services will return to [facility] 3/9.   No charge Quinn Axe, NP Palliative Medicine Team Team Phone # [phone]  Greater than 50% of this time was spent counseling and coordinating care related to the above assessment and plan.

## 2018-06-24 NOTE — Progress Notes (Signed)
PROGRESS NOTE  Hannah Arias VFI:[id number] DOB: 1910/08/08 DOA: 06/20/2018 PCP: Celene Squibb, MD  Brief History:  85 y.o.femalewith medical history ofdiastolic CHF, GERD, and recent left hip fracture presenting with 1 to 2-day history of worsening shortness of breath. Review the medical record shows that the patient has had some shortness of breath for approximately 1 week. She denies any fevers, chills, chest pain but complains of a nonproductive cough and chest congestion.  Patient was recently admitted to the hospital from 04/30/2018 through 05/07/2018 secondary to a left hip fracture from mechanical fall. She underwent ORIF of the left hip on 05/01/2018, and she was discharged to the [facility] for rehab. Since she has been at the [facility], it is been noted that she has had some hypoxia and worsening lower extremity edema. Notably, the patient had her furosemide increased by her primary provider on 06/09/2018 from 20 mg daily to 40 mg daily. During that office visit, the patient had a weight of 171 pounds. However, it was noted on 05/28/2018 during a medical visit, the patient had a weight of 163 pounds. Prior to her admission for her left hip fracture, the patient did not require any supplemental oxygen. However since she has transitioned to the [facility], she has been placed on 2 L nasal cannula secondary to hypoxia and shortness of breath.  The patient does note some occasional orthopnea type symptoms with worsening lower extremity edema. In the emergency department, the patient was afebrile and hemodynamically stable saturating 94-95% on 2 L. BMP, LFTs, CBC were essentially unremarkable.Hemoglobin was 9.2 which is at her usual baseline. BMP was 434.0. Troponin was 0.15. Chest x-ray showed right lower lobe opacity with increased interstitial markings. The patient was started on vancomycin and cefepime.  In the early am 06/21/18, pt had respiratory distress  requiring BiPAP for short period of time.  Repeat cxr did not show much change from 06/20/18.  Assessment/Plan: Acute on chronic diastolic CHF -continue IV diuresis  -Given the sluggish response to diuresis, will give metolazone x2 doses and follow output response. -Renal function within normal limits. -continue daily weight and acute I's and O's -[id number]--NA35-57%, +diastolic dysfx, RVSP 32.2 -low sodium diet education provided  Acute on Chronic respiratory failure with hypoxia -secondary to CHF and pneumonia -Patient is chronically on 2 L nasal cannula -pt required BiPAP early am 06/21/18-->now back to HFNC 8L -continue diuresis and IV antibiotics for PNA. -Last antibiotic therapy date anticipated for 06/25/2018 -Patient remains afebrile.  Elevated troponin -Patient has no chest pain -trend is flat and representing demand ischemia -personally reviewed EKG--sinus, no STT changes  Lobar pneumonia/HCAP -Continue cefepime -06/18/2018 influenza PCR negative -continue duonebs and pulmicort -continue steroids, starting tapering.  -viral respiratory panel--neg -last antibiotic therapy anticipated for 06/25/18  GERD -Continue PPI  Recent left hip fracture -05/01/2018 ORIF left hip -PT evaluation -continue Tramadol PRN pain -back to SNF for physical rehab once stable.   Thrombocytopenia -has been chronic intermittent -drop due to acute illness -serum G25--427 -serum folic CWCB--76 -follow platelets trend.   Disposition Plan:   SNF 2-3 days  Family Communication:   No Family at bedside  Consultants: palliative care  Code Status:  FULL   DVT Prophylaxis:  [place] Lovenox   Procedures: As Listed in Progress Note Above  Antibiotics: vanco 3/1>>3/2 Cefepime 3/2>>   Subjective: Feeling better, no chest pain, no nausea, no vomiting.  Patient is afebrile.  Still  requiring about 6 L [place] oxygen supplementation and with signs of fluid overload on  exam.  Objective: Vitals:   06/24/18 0258 06/24/18 0534 06/24/18 0901 06/24/18 1336  BP:  130/63  116/70  Pulse:  81  77  Resp:  16  18  Temp:  98.2 F (36.8 C)  98.7 F (37.1 C)  TempSrc:  Oral  Oral  SpO2: 98% 97% 94% 96%  Weight:  75.8 kg    Height:        Intake/Output Summary (Last 24 hours) at 06/24/2018 1356 Last data filed at 06/24/2018 0916 Gross per 24 hour  Intake 480 ml  Output 1150 ml  Net -670 ml   Weight change: -0.2 kg   Exam: General exam: Alert, awake, afebrile and in no major distress.  Still requiring oxygen supplementation with signs of fluid overload.  No chest pain, no nausea, no vomiting. Respiratory system: Bibasilar rales appreciated on exam; no using accessory muscles.   Cardiovascular system:RRR. No murmurs, rubs, gallops. Gastrointestinal system: Abdomen is nondistended, soft and nontender. No organomegaly or masses felt. Normal bowel sounds heard. Central nervous system: Alert and oriented. No focal neurological deficits. Extremities: No cyanosis or clubbing.  2+ edema bilaterally Skin: No rashes, no petechiae. Psychiatry: Mood and affect appropriate.   Data Reviewed: I have personally reviewed following labs and imaging studies  Basic Metabolic Panel: Recent Labs  Lab 06/20/18 0855 06/21/18 0542 06/22/18 0531 06/23/18 0538  NA 143 143 145 143  K 4.0 3.5 3.9 4.4  CL 100 97* 98 97*  CO2 36* 36* 38* 39*  GLUCOSE 125* 113* 172* 172*  BUN 24* 23 30* 41*  CREATININE 0.58 0.60 0.85 0.82  CALCIUM 8.6* 8.6* 8.6* 8.4*  MG  --   --  1.8  --    Liver Function Tests: Recent Labs  Lab 06/20/18 0855  AST 17  ALT 10  ALKPHOS 57  BILITOT 0.8  PROT 5.7*  ALBUMIN 2.7*   CBC: Recent Labs  Lab 06/20/18 0855 06/21/18 0542 06/22/18 0531  WBC 7.1 4.2 3.0*  NEUTROABS 5.0  --  2.2  HGB 9.2* 9.8* 9.4*  HCT 31.3* 32.5* 31.2*  MCV 104.3* 103.5* 103.7*  PLT 163 154 154   Cardiac Enzymes: Recent Labs  Lab 06/20/18 0855 06/20/18 1329  06/20/18 2022  TROPONINI 0.15* 0.14* 0.11*   Urine analysis:    Component Value Date/Time   COLORURINE AMBER (A) 05/05/2018 1446   APPEARANCEUR CLOUDY (A) 05/05/2018 1446   LABSPEC 1.028 05/05/2018 1446   PHURINE 5.0 05/05/2018 1446   GLUCOSEU NEGATIVE 05/05/2018 1446   HGBUR MODERATE (A) 05/05/2018 1446   BILIRUBINUR NEGATIVE 05/05/2018 1446   KETONESUR NEGATIVE 05/05/2018 1446   PROTEINUR 30 (A) 05/05/2018 1446   UROBILINOGEN 0.2 05/11/2013 1548   NITRITE NEGATIVE 05/05/2018 1446   LEUKOCYTESUR MODERATE (A) 05/05/2018 1446    Recent Results (from the past 240 hour(s))  MRSA PCR Screening     Status: None   Collection Time: 06/20/18 11:40 AM  Result Value Ref Range Status   MRSA by PCR NEGATIVE NEGATIVE Final    Comment:        The GeneXpert MRSA Assay (FDA approved for NASAL specimens only), is one component of a comprehensive MRSA colonization surveillance program. It is not intended to diagnose MRSA infection nor to guide or monitor treatment for MRSA infections. Performed at [facility], [address] [place], [place] [address]   Respiratory Panel by PCR     Status:  None   Collection Time: 06/21/18 10:15 AM  Result Value Ref Range Status   Adenovirus NOT DETECTED NOT DETECTED Final   Coronavirus 229E NOT DETECTED NOT DETECTED Final    Comment: (NOTE) The Coronavirus on the Respiratory Panel, DOES NOT test for the novel  Coronavirus (2019 nCoV)    Coronavirus HKU1 NOT DETECTED NOT DETECTED Final   Coronavirus NL63 NOT DETECTED NOT DETECTED Final   Coronavirus OC43 NOT DETECTED NOT DETECTED Final   Metapneumovirus NOT DETECTED NOT DETECTED Final   Rhinovirus / Enterovirus NOT DETECTED NOT DETECTED Final   Influenza A NOT DETECTED NOT DETECTED Final   Influenza B NOT DETECTED NOT DETECTED Final   Parainfluenza Virus 1 NOT DETECTED NOT DETECTED Final   Parainfluenza Virus 2 NOT DETECTED NOT DETECTED Final   Parainfluenza Virus 3 NOT DETECTED NOT DETECTED  Final   Parainfluenza Virus 4 NOT DETECTED NOT DETECTED Final   Respiratory Syncytial Virus NOT DETECTED NOT DETECTED Final   Bordetella pertussis NOT DETECTED NOT DETECTED Final   Chlamydophila pneumoniae NOT DETECTED NOT DETECTED Final   Mycoplasma pneumoniae NOT DETECTED NOT DETECTED Final    Comment: Performed at [facility], [place] [address] [place], [place] [address]     Scheduled Meds: . budesonide (PULMICORT) nebulizer solution  0.5 mg Nebulization BID  . docusate sodium  100 mg Oral BID  . enoxaparin (LOVENOX) injection  40 mg Subcutaneous Q24H  . ferrous sulfate  325 mg Oral Q breakfast  . furosemide  40 mg Intravenous BID  . GLUCERNA  237 mL Oral Daily  . guaiFENesin  20 mL Oral TID  . ipratropium-albuterol  3 mL Nebulization Q6H  . mouth rinse  15 mL Mouth Rinse BID  . methylPREDNISolone (SOLU-MEDROL) injection  60 mg Intravenous TID  . metolazone  2.5 mg Oral Daily  . pantoprazole  40 mg Oral Daily  . potassium chloride  20 mEq Oral Daily  . sodium chloride flush  3 mL Intravenous Q12H   Continuous Infusions: . sodium chloride    . ceFEPime (MAXIPIME) IV 1 g (06/24/18 1309)    Procedures/Studies: Dg Chest 2 View  Result Date: 06/20/2018 CLINICAL DATA:  Shortness of breath and productive cough for 1 week. EXAM: CHEST - 2 VIEW COMPARISON:  05/03/2018 FINDINGS: Stable mild cardiomegaly and ectasia of the thoracic aorta. Aortic atherosclerosis. New right lower lobe opacity and small right pleural effusion, suspicious for pneumonia. Left lung remains clear. IMPRESSION: New right lower lobe opacity and small right pleural effusion, suspicious for pneumonia. Recommend chest radiographic follow-up to ensure resolution. Electronically Signed   By: Earle Gell M.D.   On: 06/20/2018 10:05   Dg Chest Port 1 View  Result Date: 06/23/2018 CLINICAL DATA:  Difficulty breathing EXAM: PORTABLE CHEST 1 VIEW COMPARISON:  06/21/2018 FINDINGS: Cardiac shadow is stable. Aortic  calcifications and tortuosity are again seen. Stable scoliosis is noted. Increasing consolidation in the right base is noted with right-sided pleural effusion. Persistent density in the right paratracheal region is seen related to tortuous vascularity IMPRESSION: Increasing right basilar consolidation and effusion. Electronically Signed   By: Inez Catalina M.D.   On: 06/23/2018 07:39   Dg Chest Port 1 View  Result Date: 06/21/2018 CLINICAL DATA:  Labored breathing EXAM: PORTABLE CHEST 1 VIEW COMPARISON:  06/20/2018, CT 05/03/2018, radiograph 04/30/2018 FINDINGS: Bilateral pleural effusions, right greater than left with continued airspace disease at the right lung base. Stable cardiomediastinal silhouette with aortic atherosclerosis. No pneumothorax. IMPRESSION: 1. Similar  appearance of right greater than left pleural effusion with continued airspace disease at the right base which may reflect atelectasis or pneumonia Electronically Signed   By: Donavan Foil M.D.   On: 06/21/2018 02:04   Xr Hip Unilat W Or W/o Pelvis 2-3 Views Left  Result Date: 06/17/2018 AP lateral left hip obtained and reviewed this shows trochanteric nail with interval healing.  No evidence of loosening or subsidence. Impression: Satisfactory postop ORIF left hip fracture with truck nail.   Barton Dubois, MD  [facility] Pager [phone]   06/24/2018, 1:56 PM   LOS: 4 days

## 2018-06-25 ENCOUNTER — Inpatient Hospital Stay (HOSPITAL_COMMUNITY): Payer: Medicare HMO

## 2018-06-25 LAB — BASIC METABOLIC PANEL
ANION GAP: 8 (ref 5–15)
BUN: 46 mg/dL — ABNORMAL HIGH (ref 8–23)
CO2: 41 mmol/L — ABNORMAL HIGH (ref 22–32)
Calcium: 8.9 mg/dL (ref 8.9–10.3)
Chloride: 93 mmol/L — ABNORMAL LOW (ref 98–111)
Creatinine, Ser: 0.83 mg/dL (ref 0.44–1.00)
GFR calc non Af Amer: 56 mL/min — ABNORMAL LOW (ref >60)
Glucose, Bld: 150 mg/dL — ABNORMAL HIGH (ref 70–99)
Potassium: 3.9 mmol/L (ref 3.5–5.1)
Sodium: 142 mmol/L (ref 135–145)

## 2018-06-25 MED ORDER — METHYLPREDNISOLONE SODIUM SUCC 125 MG IJ SOLR
60.0000 mg | Freq: Two times a day (BID) | INTRAMUSCULAR | Status: DC
  Administered 2018-06-26: 60 mg via INTRAVENOUS
  Filled 2018-06-25: qty 2

## 2018-06-25 MED ORDER — IPRATROPIUM-ALBUTEROL 0.5-2.5 (3) MG/3ML IN SOLN
3.0000 mL | Freq: Four times a day (QID) | RESPIRATORY_TRACT | Status: DC
  Administered 2018-06-25 – 2018-06-28 (×11): 3 mL via RESPIRATORY_TRACT
  Filled 2018-06-25 (×10): qty 3

## 2018-06-25 NOTE — Plan of Care (Signed)
  Problem: Acute Rehab PT Goals(only PT should resolve) Goal: Pt Will Go Supine/Side To Sit Outcome: Progressing Flowsheets (Taken 06/25/2018 1429) Pt will go Supine/Side to Sit: with moderate assist Goal: Patient Will Transfer Sit To/From Stand Outcome: Progressing Flowsheets (Taken 06/25/2018 1429) Patient will transfer sit to/from stand: with moderate assist; with maximum assist Goal: Pt Will Transfer Bed To Chair/Chair To Bed Outcome: Progressing Flowsheets (Taken 06/25/2018 1429) Pt will Transfer Bed to Chair/Chair to Bed: with max assist; with mod assist Goal: Pt Will Ambulate Outcome: Progressing Flowsheets (Taken 06/25/2018 1429) Pt will Ambulate: 10 feet; with moderate assist; with maximum assist; with rolling walker   2:29 PM, 06/25/18 Lonell Grandchild, MPT Physical Therapist with [facility] [phone] office [phone] mobile phone

## 2018-06-25 NOTE — Care Management Important Message (Signed)
Important Message  Patient Details  Name: CHARENE MCCALLISTER MRN: [id number] Date of Birth: [birth year]   Medicare Important Message Given:  Yes    Sherald Barge, RN 06/25/2018, 12:40 PM

## 2018-06-25 NOTE — Clinical Social Work Note (Addendum)
LCSW following with daily chart review and discussion in Progression. Received a call yesterday stating that family was at [facility] inquiring about hospice for pt. Updated MD who requested Palliative consult. MD indicated that he felt pt was improving and that Hospice care not necessarily indicated and that pt would benefit from rehab back at [facility] once stable. Once therapy notes are in, [facility] will have to work on insurance authorization for rehab.   Updated Kerri at [facility].  Will follow.

## 2018-06-25 NOTE — Evaluation (Signed)
Physical Therapy Evaluation Patient Details Name: Hannah Arias MRN: [id number] DOB: May 07, 1910 Today's Date: 06/25/2018   History of Present Illness  85 y.o. female with medical history of diastolic CHF, GERD, and recent left hip fracture presenting with 1 to 2-day history of worsening shortness of breath.  Review the medical record shows that the patient has had some shortness of breath for approximately 1 week.  She denies any fevers, chills, chest pain but complains of a nonproductive cough and chest congestion.    Patient was recently admitted to the hospital from 04/30/2018 through 05/07/2018 secondary to a left hip fracture from mechanical fall.  She underwent ORIF of the left hip on 05/01/2018, and she was discharged to the [facility] for rehab.  Since she has been at the [facility], it is been noted that she has had some hypoxia and worsening lower extremity edema.  Notably, the patient had her furosemide increased by her primary provider on 06/09/2018 from 20 mg daily to 40 mg daily.  During that office visit, the patient had a weight of 171 pounds.  However, it was noted on 05/28/2018 during a medical visit, the patient had a weight of 163 pounds.  Prior to her admission for her left hip fracture, the patient did not require any supplemental oxygen.  However since she has transitioned to the [facility], she has been placed on 2 L nasal cannula secondary to hypoxia and shortness of breath.   The patient does note some occasional orthopnea type symptoms with worsening lower extremity edema.    Clinical Impression  Patient has much difficulty using BUE for sitting up at  Bedside due to pain and fragile skin with c/o increased pain with any pressure, able to use BUE to help support self while seated at bedside, partially stood with RW, but unable to transfer using RW due to weakness and required partial stand pivot to transfer to chair with Max assist.  Patient tolerated sitting up in chair  after therapy - nursing staff aware.  Patient will benefit from continued physical therapy in hospital and recommended venue below to increase strength, balance, endurance for safe ADLs and gait.    Follow Up Recommendations SNF    Equipment Recommendations  None recommended by PT    Recommendations for Other Services       Precautions / Restrictions Precautions Precautions: Fall Precaution Comments: fx result of fall Restrictions Weight Bearing Restrictions: Yes LLE Weight Bearing: Weight bearing as tolerated      Mobility  Bed Mobility Overal bed mobility: Needs Assistance Bed Mobility: Supine to Sit Rolling: Max assist   Supine to sit: Max assist        Transfers Overall transfer level: Needs assistance Equipment used: Rolling walker (2 wheeled);1 person hand held assist Transfers: Sit to/from [facility] Sit to Stand: Max assist Stand pivot transfers: Max assist       General transfer comment: partial standing with RW, unable to transfer using RW due to weakness, required stand pivot with knees blocked to transfer to chair  Ambulation/Gait                Stairs            Wheelchair Mobility    Modified Rankin (Stroke Patients Only)       Balance Overall balance assessment: Needs assistance Sitting-balance support: Feet supported;Bilateral upper extremity supported Sitting balance-Leahy Scale: Fair     Standing balance support: Bilateral upper extremity supported;During functional  activity Standing balance-Leahy Scale: Poor Standing balance comment: using RW                             Pertinent Vitals/Pain Pain Assessment: Faces Faces Pain Scale: Hurts even more Pain Location: left hip with movement Pain Descriptors / Indicators: Grimacing;Guarding;Discomfort Pain Intervention(s): Limited activity within patient's tolerance;Monitored during session    Home Living Family/patient expects to be  discharged to:: Private residence Living Arrangements: Alone Available Help at Discharge: Family;Available PRN/intermittently Type of Home: House Home Access: Stairs to enter;Ramped entrance   Entrance Stairs-Number of Steps: 1 Home Layout: One level Home Equipment: Walker - 2 wheels;Tub bench Additional Comments: Patient very HOH, infor from previous admission    Prior Function Level of Independence: Needs assistance   Gait / Transfers Assistance Needed: uses RW for ambulation   ADL's / Homemaking Assistance Needed: family assist        Hand Dominance        Extremity/Trunk Assessment   Upper Extremity Assessment Upper Extremity Assessment: Generalized weakness    Lower Extremity Assessment Lower Extremity Assessment: Generalized weakness;RLE deficits/detail;LLE deficits/detail RLE Deficits / Details: grossly 3/5 LLE Deficits / Details: grossly 2+/5    Cervical / Trunk Assessment Cervical / Trunk Assessment: Kyphotic  Communication   Communication: HOH  Cognition Arousal/Alertness: Awake/alert Behavior During Therapy: Anxious;Flat affect Overall Cognitive Status: Within Functional Limits for tasks assessed                                        General Comments      Exercises     Assessment/Plan    PT Assessment Patient needs continued PT services  PT Problem List Decreased strength;Decreased range of motion;Decreased activity tolerance;Decreased balance;Decreased mobility;Pain       PT Treatment Interventions DME instruction;Gait training;Functional mobility training;Therapeutic activities;Therapeutic exercise;Balance training;Patient/family education    PT Goals (Current goals can be found in the Care Plan section)  Acute Rehab PT Goals Patient Stated Goal: none stated PT Goal Formulation: With patient Time For Goal Achievement: 07/09/18 Potential to Achieve Goals: Fair    Frequency Min 3X/week   Barriers to discharge         Co-evaluation               AM-PAC PT "6 Clicks" Mobility  Outcome Measure Help needed turning from your back to your side while in a flat bed without using bedrails?: A Lot Help needed moving from lying on your back to sitting on the side of a flat bed without using bedrails?: A Lot Help needed moving to and from a bed to a chair (including a wheelchair)?: A Lot Help needed standing up from a chair using your arms (e.g., wheelchair or bedside chair)?: A Lot Help needed to walk in hospital room?: Total Help needed climbing 3-5 steps with a railing? : Total 6 Click Score: 10    End of Session   Activity Tolerance: Patient tolerated treatment well;Patient limited by fatigue;Patient limited by pain Patient left: in chair;with call bell/phone within reach Nurse Communication: Mobility status PT Visit Diagnosis: Unsteadiness on feet (R26.81);Other abnormalities of gait and mobility (R26.89);Muscle weakness (generalized) (M62.81);History of falling (Z91.81);Difficulty in walking, not elsewhere classified (R26.2);Pain Pain - Right/Left: Left Pain - part of body: Hip    Time: 1340-1403 PT Time Calculation (min) (ACUTE ONLY): 23 min  Charges:   PT Evaluation $PT Eval High Complexity: 1 High PT Treatments $Therapeutic Activity: 23-37 mins        2:27 PM, 06/25/18 Lonell Grandchild, MPT Physical Therapist with [facility] [phone] office [phone] mobile phone

## 2018-06-25 NOTE — Progress Notes (Signed)
PROGRESS NOTE  Hannah Arias HFW:[id number] DOB: [birth year] DOA: 06/20/2018 PCP: Celene Squibb, MD  Brief History:  85 y.o.femalewith medical history ofdiastolic CHF, GERD, and recent left hip fracture presenting with 1 to 2-day history of worsening shortness of breath. Review the medical record shows that the patient has had some shortness of breath for approximately 1 week. She denies any fevers, chills, chest pain but complains of a nonproductive cough and chest congestion.  Patient was recently admitted to the hospital from 04/30/2018 through 05/07/2018 secondary to a left hip fracture from mechanical fall. She underwent ORIF of the left hip on 05/01/2018, and she was discharged to the [place] for rehab. Since she has been at the [facility], it is been noted that she has had some hypoxia and worsening lower extremity edema. Notably, the patient had her furosemide increased by her primary provider on 06/09/2018 from 20 mg daily to 40 mg daily. During that office visit, the patient had a weight of 171 pounds. However, it was noted on 05/28/2018 during a medical visit, the patient had a weight of 163 pounds. Prior to her admission for her left hip fracture, the patient did not require any supplemental oxygen. However since she has transitioned to the [facility], she has been placed on 2 L nasal cannula secondary to hypoxia and shortness of breath.  The patient does note some occasional orthopnea type symptoms with worsening lower extremity edema. In the emergency department, the patient was afebrile and hemodynamically stable saturating 94-95% on 2 L. BMP, LFTs, CBC were essentially unremarkable.Hemoglobin was 9.2 which is at her usual baseline. BMP was 434.0. Troponin was 0.15. Chest x-ray showed right lower lobe opacity with increased interstitial markings. The patient was started on vancomycin and cefepime.  In the early am 06/21/18, pt had respiratory distress  requiring BiPAP for short period of time.  Repeat cxr did not show much change from 06/20/18.  Assessment/Plan: Acute on chronic diastolic CHF -Significant improvement in patient's volume and urine output  -continue IV diuresis and X 1 more dose of metolazone -Renal function has remained within normal limits. -continue daily weight and acute I's and O's -OYDXAJOINOMVEH--MC94-70%, +diastolic dysfx, RVSP 96.2 -low sodium diet education provided -continue weaning oxygen supplementation as tolerated.   Acute on Chronic respiratory failure with hypoxia -secondary to CHF and pneumonia -Patient has been chronically on 2 L nasal cannula at SNF. -pt required BiPAP early am 06/21/18-->now back to [facility] 5L -continue diuresis and complete IV antibiotics for PNA. -Last antibiotic therapy date is today 06/25/2018 -Patient is afebrile.  Elevated troponin -Patient has no chest pain -trend is flat and representing demand ischemia -personally reviewed EKG--sinus, no STT changes  Lobar pneumonia/HCAP -Continue cefepime -06/18/2018 influenza PCR negative -continue duonebs and pulmicort -continue tapering steroids -viral respiratory panel--neg -last antibiotic therapy anticipated for later today 06/25/18  GERD -Continue PPI -denying any abd pain, nausea or vomiting.  Recent left hip fracture -05/01/2018 ORIF left hip -PT evaluation -continue Tramadol PRN pain -back to SNF for physical rehab once stable.   Thrombocytopenia -has been chronic intermittent -drop due to acute illness -serum E36--629 -serum folic UTML--46 -follow platelets trend.   Disposition Plan:    Discussed with daughter-in-law and son at bedside.  They are concerned of the poor overall response and quality since January after breaking her hip.  Despite improvement in her fluid overload and pneumonia process, they feel that the patient will  better be served under hospice care.  Family Communication:    Son and  daughter-in-law  Consultants: palliative care/hospice care  Code Status:  FULL   DVT Prophylaxis:  [place] Lovenox   Procedures: As Listed in Progress Note Above  Antibiotics: vanco 3/1>>3/2 Cefepime 3/2>>   Subjective: Denies chest pain, no nausea, no vomiting, no abdominal pain.  Breathing continue improving.  Patient is afebrile.  Using 5 L nasal cannula supplementation.  Patient reporting good urine output.  Objective: Vitals:   06/25/18 0500 06/25/18 0651 06/25/18 0757 06/25/18 0805  BP:  128/79    Pulse:      Resp:  18    Temp:  97.8 F (36.6 C)    TempSrc:  Axillary    SpO2:  96% 99% 98%  Weight: 74 kg     Height:        Intake/Output Summary (Last 24 hours) at 06/25/2018 1204 Last data filed at 06/24/2018 1856 Gross per 24 hour  Intake 580 ml  Output 1800 ml  Net -1220 ml   Weight change: -1.8 kg   Exam: General exam: Alert, awake, afebrile and in no distress.  Denies chest pain, nausea, vomiting or abdominal pain.  With improvement in fluid overload. Respiratory system: Scattered rhonchi right, very little expiratory wheezing; no using accessory muscles.  Wearing nasal cannula supplementation and no having frank crackles on exam. Cardiovascular system: RRR. No murmurs, rubs, gallops. Gastrointestinal system: Abdomen is nondistended, soft and nontender. No organomegaly or masses felt. Normal bowel sounds heard. Central nervous system: Alert and oriented. No focal neurological deficits. Extremities: No cyanosis or clubbing; 1+ edema bilaterally. Skin: No rashes, no petechiae. Psychiatry: Mood and affect appropriate.  Data Reviewed: I have personally reviewed following labs and imaging studies  Basic Metabolic Panel: Recent Labs  Lab 06/20/18 0855 06/21/18 0542 06/22/18 0531 06/23/18 0538 06/25/18 0520  NA 143 143 145 143 142  K 4.0 3.5 3.9 4.4 3.9  CL 100 97* 98 97* 93*  CO2 36* 36* 38* 39* 41*  GLUCOSE 125* 113* 172* 172* 150*  BUN 24* 23 30*  41* 46*  CREATININE 0.58 0.60 0.85 0.82 0.83  CALCIUM 8.6* 8.6* 8.6* 8.4* 8.9  MG  --   --  1.8  --   --    Liver Function Tests: Recent Labs  Lab 06/20/18 0855  AST 17  ALT 10  ALKPHOS 57  BILITOT 0.8  PROT 5.7*  ALBUMIN 2.7*   CBC: Recent Labs  Lab 06/20/18 0855 06/21/18 0542 06/22/18 0531  WBC 7.1 4.2 3.0*  NEUTROABS 5.0  --  2.2  HGB 9.2* 9.8* 9.4*  HCT 31.3* 32.5* 31.2*  MCV 104.3* 103.5* 103.7*  PLT 163 154 154   Cardiac Enzymes: Recent Labs  Lab 06/20/18 0855 06/20/18 1329 06/20/18 2022  TROPONINI 0.15* 0.14* 0.11*   Urine analysis:    Component Value Date/Time   COLORURINE AMBER (A) 05/05/2018 1446   APPEARANCEUR CLOUDY (A) 05/05/2018 1446   LABSPEC 1.028 05/05/2018 1446   PHURINE 5.0 05/05/2018 1446   GLUCOSEU NEGATIVE 05/05/2018 1446   HGBUR MODERATE (A) 05/05/2018 1446   BILIRUBINUR NEGATIVE 05/05/2018 1446   KETONESUR NEGATIVE 05/05/2018 1446   PROTEINUR 30 (A) 05/05/2018 1446   UROBILINOGEN 0.2 05/11/2013 1548   NITRITE NEGATIVE 05/05/2018 1446   LEUKOCYTESUR MODERATE (A) 05/05/2018 1446    Recent Results (from the past 240 hour(s))  MRSA PCR Screening     Status: None   Collection Time: 06/20/18 11:40 AM  Result Value Ref Range Status   MRSA by PCR NEGATIVE NEGATIVE Final    Comment:        The GeneXpert MRSA Assay (FDA approved for NASAL specimens only), is one component of a comprehensive MRSA colonization surveillance program. It is not intended to diagnose MRSA infection nor to guide or monitor treatment for MRSA infections. Performed at [facility], [address], [place], [place] [address]   Respiratory Panel by PCR     Status: None   Collection Time: 06/21/18 10:15 AM  Result Value Ref Range Status   Adenovirus NOT DETECTED NOT DETECTED Final   Coronavirus 229E NOT DETECTED NOT DETECTED Final    Comment: (NOTE) The Coronavirus on the Respiratory Panel, DOES NOT test for the novel  Coronavirus (2019 nCoV)     Coronavirus HKU1 NOT DETECTED NOT DETECTED Final   Coronavirus NL63 NOT DETECTED NOT DETECTED Final   Coronavirus OC43 NOT DETECTED NOT DETECTED Final   Metapneumovirus NOT DETECTED NOT DETECTED Final   Rhinovirus / Enterovirus NOT DETECTED NOT DETECTED Final   Influenza A NOT DETECTED NOT DETECTED Final   Influenza B NOT DETECTED NOT DETECTED Final   Parainfluenza Virus 1 NOT DETECTED NOT DETECTED Final   Parainfluenza Virus 2 NOT DETECTED NOT DETECTED Final   Parainfluenza Virus 3 NOT DETECTED NOT DETECTED Final   Parainfluenza Virus 4 NOT DETECTED NOT DETECTED Final   Respiratory Syncytial Virus NOT DETECTED NOT DETECTED Final   Bordetella pertussis NOT DETECTED NOT DETECTED Final   Chlamydophila pneumoniae NOT DETECTED NOT DETECTED Final   Mycoplasma pneumoniae NOT DETECTED NOT DETECTED Final    Comment: Performed at [facility], [place] [address], [place], [place] [address]     Scheduled Meds: . budesonide (PULMICORT) nebulizer solution  0.5 mg Nebulization BID  . docusate sodium  100 mg Oral BID  . enoxaparin (LOVENOX) injection  40 mg Subcutaneous Q24H  . ferrous sulfate  325 mg Oral Q breakfast  . furosemide  40 mg Intravenous BID  . Glucerna  237 mL Oral Daily  . guaiFENesin  20 mL Oral TID  . ipratropium-albuterol  3 mL Nebulization Q6H WA  . mouth rinse  15 mL Mouth Rinse BID  . methylPREDNISolone (SOLU-MEDROL) injection  60 mg Intravenous TID  . pantoprazole  40 mg Oral Daily  . potassium chloride  20 mEq Oral Daily  . sodium chloride flush  3 mL Intravenous Q12H   Continuous Infusions: . sodium chloride    . ceFEPime (MAXIPIME) IV 1 g (06/25/18 1203)    Procedures/Studies: Dg Chest 2 View  Result Date: 06/20/2018 CLINICAL DATA:  Shortness of breath and productive cough for 1 week. EXAM: CHEST - 2 VIEW COMPARISON:  05/03/2018 FINDINGS: Stable mild cardiomegaly and ectasia of the thoracic aorta. Aortic atherosclerosis. New right lower lobe opacity and small  right pleural effusion, suspicious for pneumonia. Left lung remains clear. IMPRESSION: New right lower lobe opacity and small right pleural effusion, suspicious for pneumonia. Recommend chest radiographic follow-up to ensure resolution. Electronically Signed   By: Earle Gell M.D.   On: 06/20/2018 10:05   Dg Chest Port 1 View  Result Date: 06/25/2018 CLINICAL DATA:  Shortness of breath, chronic kidney disease EXAM: PORTABLE CHEST 1 VIEW COMPARISON:  06/23/2010 FINDINGS: Cardiomegaly. Tortuous calcified aorta. Right upper mediastinal soft tissue prominence shown on prior CT to represent a combination of nodular goiter and prominent tortuous great vessels. Right basilar atelectasis or infiltrate with left base atelectasis. No definite  visible effusions or acute bony abnormality. No significant change since prior study. IMPRESSION: No significant change since prior study. Electronically Signed   By: Rolm Baptise M.D.   On: 06/25/2018 07:53   Dg Chest Port 1 View  Result Date: 06/23/2018 CLINICAL DATA:  Difficulty breathing EXAM: PORTABLE CHEST 1 VIEW COMPARISON:  06/21/2018 FINDINGS: Cardiac shadow is stable. Aortic calcifications and tortuosity are again seen. Stable scoliosis is noted. Increasing consolidation in the right base is noted with right-sided pleural effusion. Persistent density in the right paratracheal region is seen related to tortuous vascularity IMPRESSION: Increasing right basilar consolidation and effusion. Electronically Signed   By: Inez Catalina M.D.   On: 06/23/2018 07:39   Dg Chest Port 1 View  Result Date: 06/21/2018 CLINICAL DATA:  Labored breathing EXAM: PORTABLE CHEST 1 VIEW COMPARISON:  06/20/2018, CT 05/03/2018, radiograph 04/30/2018 FINDINGS: Bilateral pleural effusions, right greater than left with continued airspace disease at the right lung base. Stable cardiomediastinal silhouette with aortic atherosclerosis. No pneumothorax. IMPRESSION: 1. Similar appearance of right greater  than left pleural effusion with continued airspace disease at the right base which may reflect atelectasis or pneumonia Electronically Signed   By: Donavan Foil M.D.   On: 06/21/2018 02:04   Xr Hip Unilat W Or W/o Pelvis 2-3 Views Left  Result Date: 06/17/2018 AP lateral left hip obtained and reviewed this shows trochanteric nail with interval healing.  No evidence of loosening or subsidence. Impression: Satisfactory postop ORIF left hip fracture with truck nail.   Barton Dubois, MD  [facility] Pager [phone]   06/25/2018, 12:04 PM   LOS: 5 days

## 2018-06-25 NOTE — Evaluation (Signed)
Modified Barium Swallow Progress Note  Patient Details  Name: Hannah Arias MRN: [id number] Date of Birth: 07/22/1910  Today's Date: 06/25/2018  Modified Barium Swallow completed.  Full report located under Chart Review in the Imaging Section.  Brief recommendations include the following:  Clinical Impression  Pt presents with mild/moderate sensorimotor oropharyngeal dysphagia characterized by consistent trace to minimal amount of penetration of thin liquids during the swallow to the level of the cords with subsequent silent aspiration after the swallow secondary to decreased Laryngeal vestibule closure (LVC). Oral phase is characterized by decreased bolus cohesion and premature spillage to the level of the pyriforms; mild to mod residue of thin liquids pooling in the valleculae and pyriforms after the swallow of which she is not sensate to. Consistent pharyngeal residue of all consistencies noted after the swallow secondary to decreased strength and duration of pharyngeal squeeze during the swallow. Volume dependent improved LVC manifested by no penetration or aspiration visualized with NTL or puree trials. Difficulty with oral AP tranist of pill requiring multiple trials and consistencies to transfer; Pill with stasis in the valleculae requiring again multiple trials of NTL and puree to pass through pharyngeal space. Esophageal sweep reveals stasis of the pill in the upper esophagus with what appeared to be reduced esophageal paristalsis and despite multiple trials and additional administrations was not visualized passing through the distal esophagus. No Radiologist present to confirm. Recommend continue D3/mechanical soft diet with NTL  with precautions re: small sips/bites, alternate bites and sips, take additional dry swallows throughout meal, always be seated upright for all PO. Recommend meds be administered crushed in puree. ST will continue to follow for diet tolerance and family/patient  education.    Swallow Evaluation Recommendations       SLP Diet Recommendations: Dysphagia 3 (Mech soft) solids;Nectar thick liquid   Liquid Administration via: Cup;Straw   Medication Administration: Crushed with puree   Supervision: Intermittent supervision to cue for compensatory strategies;Staff to assist with self feeding;Patient able to self feed   Compensations: Slow rate;Small sips/bites;Lingual sweep for clearance of pocketing   Postural Changes: Seated upright at 90 degrees;Remain semi-upright after after feeds/meals (Comment)   Oral Care Recommendations: Oral care BID   Other Recommendations: Order thickener from [place]. Roddie Mc, CCC-SLP Speech Language Pathologist   Wende Bushy 06/25/2018,3:23 PM

## 2018-06-26 LAB — BASIC METABOLIC PANEL
Anion gap: 11 (ref 5–15)
BUN: 46 mg/dL — ABNORMAL HIGH (ref 8–23)
CO2: 43 mmol/L — ABNORMAL HIGH (ref 22–32)
Calcium: 9.1 mg/dL (ref 8.9–10.3)
Chloride: 86 mmol/L — ABNORMAL LOW (ref 98–111)
Creatinine, Ser: 0.75 mg/dL (ref 0.44–1.00)
GFR calc non Af Amer: >60 mL/min (ref >60)
Glucose, Bld: 105 mg/dL — ABNORMAL HIGH (ref 70–99)
Potassium: 3.4 mmol/L — ABNORMAL LOW (ref 3.5–5.1)
Sodium: 140 mmol/L (ref 135–145)

## 2018-06-26 MED ORDER — PREDNISONE 20 MG PO TABS
30.0000 mg | ORAL_TABLET | Freq: Every day | ORAL | Status: DC
  Administered 2018-06-27: 30 mg via ORAL
  Filled 2018-06-26 (×2): qty 1

## 2018-06-26 MED ORDER — FUROSEMIDE 40 MG PO TABS
40.0000 mg | ORAL_TABLET | Freq: Two times a day (BID) | ORAL | Status: DC
  Administered 2018-06-26 – 2018-06-28 (×4): 40 mg via ORAL
  Filled 2018-06-26 (×4): qty 1

## 2018-06-26 NOTE — Progress Notes (Signed)
Pt. Refused her 02, checked and noted to be 92 % on R/A. No acute distress noted. Will continue to monitor.

## 2018-06-26 NOTE — Progress Notes (Signed)
PROGRESS NOTE  Hannah Arias DUK:[id number] DOB: [birth year] DOA: 06/20/2018 PCP: Celene Squibb, MD  Brief History:  85 y.o.femalewith medical history ofdiastolic CHF, GERD, and recent left hip fracture presenting with 1 to 2-day history of worsening shortness of breath. Review the medical record shows that the patient has had some shortness of breath for approximately 1 week. She denies any fevers, chills, chest pain but complains of a nonproductive cough and chest congestion.  Patient was recently admitted to the hospital from 04/30/2018 through 05/07/2018 secondary to a left hip fracture from mechanical fall. She underwent ORIF of the left hip on 05/01/2018, and she was discharged to the [facility] for rehab. Since she has been at the [facility], it is been noted that she has had some hypoxia and worsening lower extremity edema. Notably, the patient had her furosemide increased by her primary provider on 06/09/2018 from 20 mg daily to 40 mg daily. During that office visit, the patient had a weight of 171 pounds. However, it was noted on 05/28/2018 during a medical visit, the patient had a weight of 163 pounds. Prior to her admission for her left hip fracture, the patient did not require any supplemental oxygen. However since she has transitioned to the [facility], she has been placed on 2 L nasal cannula secondary to hypoxia and shortness of breath.  The patient does note some occasional orthopnea type symptoms with worsening lower extremity edema. In the emergency department, the patient was afebrile and hemodynamically stable saturating 94-95% on 2 L. BMP, LFTs, CBC were essentially unremarkable.Hemoglobin was 9.2 which is at her usual baseline. BMP was 434.0. Troponin was 0.15. Chest x-ray showed right lower lobe opacity with increased interstitial markings. The patient was started on vancomycin and cefepime.  In the early am 06/21/18, pt had respiratory distress  requiring BiPAP for short period of time.  Repeat cxr did not show much change from 06/20/18.  Assessment/Plan: Acute on chronic diastolic CHF -Significant improvement in patient's volume and urine output  -continue Lasix, but transition to 40 mg by mouth twice a day.  Continue monitoring urine output -Renal function has remained within normal limits. -continue daily weight and acute I's and O's -[id number]--VP71-06%, +diastolic dysfx, RVSP 26.9 -low sodium diet education provided -continue weaning oxygen supplementation as tolerated.   Acute on Chronic respiratory failure with hypoxia -secondary to CHF and pneumonia -Patient has been chronically on 2 L nasal cannula at SNF. -pt required BiPAP early am 06/21/18-->now back to HFNC 5L -continue diuresis; but now transition to oral regimen. -Last antibiotic therapy date on 06/25/2018 -Patient is afebrile.  Elevated troponin -Patient has no chest pain -trend is flat and representing demand ischemia -personally reviewed EKG--sinus, no STT changes  Lobar pneumonia/HCAP -Continue cefepime -06/18/2018 influenza PCR negative -continue duonebs and pulmicort -continue tapering steroids; transitioning to oral regimen. -viral respiratory panel--neg -last antibiotic therapy anticipated for later today 06/25/18  GERD -Continue PPI -denying any abd pain, nausea or vomiting.  Recent left hip fracture -05/01/2018 ORIF left hip -PT evaluation -continue Tramadol PRN pain -back to SNF for physical rehab once stable.   Thrombocytopenia -has been chronic intermittent -drop due to acute illness -serum S85--462 -serum folic VOJJ--00 -follow platelets trend.  Hypokalemia -In the setting of diuresis -Continue repletion.   Disposition Plan:    Discussed with daughter-in-law and son at bedside on 06/25/2018.  They are concerned of the poor overall response and quality since  January after breaking her hip.  Despite improvement in her fluid  overload and pneumonia process, they feel that the patient will better be served under hospice care.  Transition diuresis to oral regimen and also transition steroids to oral.  Continue weaning oxygen supplementation as tolerated.  Family Communication:    No family at bedside.  Consultants: palliative care/hospice care  Code Status:  FULL   DVT Prophylaxis:  [place] Lovenox   Procedures: As Listed in Progress Note Above  Antibiotics: vanco 3/1>>3/2 Cefepime 3/2>>   Subjective: No chest pain, no palpitations, no nausea, no vomiting, no abdominal pain.  Patient breathing continued to improve as she has had satisfactory response to diuresis.  Using intermittent 2-3 L nasal cannula oxygen supplementation at this time.  Reports good urine output.  Objective: Vitals:   06/25/18 2052 06/26/18 0619 06/26/18 0748 06/26/18 1335  BP: 130/65 (!) 133/57    Pulse: 77 85    Resp: 20 18    Temp: 98.5 F (36.9 C) 98.5 F (36.9 C)    TempSrc: Oral Oral    SpO2: 98% 92% (!) 85% 90%  Weight:  69.2 kg    Height:        Intake/Output Summary (Last 24 hours) at 06/26/2018 1539 Last data filed at 06/26/2018 0500 Gross per 24 hour  Intake 120 ml  Output 750 ml  Net -630 ml   Weight change: -4.8 kg   Exam: General exam: Alert, awake, and afebrile.  Patient very hard of hearing.  Denies chest pain, nausea, vomiting or abdominal pain.  Reported good urine output and there has been significant improvement in her fluid overload. Respiratory system: No frank crackles, positive rhonchi right, decreased breath sounds at the bases.  No wheezing. Cardiovascular system:RRR. No murmurs, rubs, gallops. Gastrointestinal system: Abdomen is nondistended, soft and nontender. No organomegaly or masses felt. Normal bowel sounds heard. Central nervous system: Alert and oriented. No focal neurological deficits. Extremities: No cyanosis or clubbing.  Trace edema bilaterally. Skin: Multiple bruises, no rash, no  petechiae. Psychiatry: Mood and affect appropriate.  Data Reviewed: I have personally reviewed following labs and imaging studies  Basic Metabolic Panel: Recent Labs  Lab 06/21/18 0542 06/22/18 0531 06/23/18 0538 06/25/18 0520 06/26/18 0705  NA 143 145 143 142 140  K 3.5 3.9 4.4 3.9 3.4*  CL 97* 98 97* 93* 86*  CO2 36* 38* 39* 41* 43*  GLUCOSE 113* 172* 172* 150* 105*  BUN 23 30* 41* 46* 46*  CREATININE 0.60 0.85 0.82 0.83 0.75  CALCIUM 8.6* 8.6* 8.4* 8.9 9.1  MG  --  1.8  --   --   --    Liver Function Tests: Recent Labs  Lab 06/20/18 0855  AST 17  ALT 10  ALKPHOS 57  BILITOT 0.8  PROT 5.7*  ALBUMIN 2.7*   CBC: Recent Labs  Lab 06/20/18 0855 06/21/18 0542 06/22/18 0531  WBC 7.1 4.2 3.0*  NEUTROABS 5.0  --  2.2  HGB 9.2* 9.8* 9.4*  HCT 31.3* 32.5* 31.2*  MCV 104.3* 103.5* 103.7*  PLT 163 154 154   Cardiac Enzymes: Recent Labs  Lab 06/20/18 0855 06/20/18 1329 06/20/18 2022  TROPONINI 0.15* 0.14* 0.11*   Urine analysis:    Component Value Date/Time   COLORURINE AMBER (A) 05/05/2018 1446   APPEARANCEUR CLOUDY (A) 05/05/2018 1446   LABSPEC 1.028 05/05/2018 1446   PHURINE 5.0 05/05/2018 1446   GLUCOSEU NEGATIVE 05/05/2018 1446   HGBUR MODERATE (A) 05/05/2018 1446  BILIRUBINUR NEGATIVE 05/05/2018 [place] 05/05/2018 1446   PROTEINUR 30 (A) 05/05/2018 1446   UROBILINOGEN 0.2 05/11/2013 1548   NITRITE NEGATIVE 05/05/2018 1446   LEUKOCYTESUR MODERATE (A) 05/05/2018 1446    Recent Results (from the past 240 hour(s))  MRSA PCR Screening     Status: None   Collection Time: 06/20/18 11:40 AM  Result Value Ref Range Status   MRSA by PCR NEGATIVE NEGATIVE Final    Comment:        The GeneXpert MRSA Assay (FDA approved for NASAL specimens only), is one component of a comprehensive MRSA colonization surveillance program. It is not intended to diagnose MRSA infection nor to guide or monitor treatment for MRSA  infections. Performed at [facility], [address], [place], [place] [address]   Respiratory Panel by PCR     Status: None   Collection Time: 06/21/18 10:15 AM  Result Value Ref Range Status   Adenovirus NOT DETECTED NOT DETECTED Final   Coronavirus 229E NOT DETECTED NOT DETECTED Final    Comment: (NOTE) The Coronavirus on the Respiratory Panel, DOES NOT test for the novel  Coronavirus (2019 nCoV)    Coronavirus HKU1 NOT DETECTED NOT DETECTED Final   Coronavirus NL63 NOT DETECTED NOT DETECTED Final   Coronavirus OC43 NOT DETECTED NOT DETECTED Final   Metapneumovirus NOT DETECTED NOT DETECTED Final   Rhinovirus / Enterovirus NOT DETECTED NOT DETECTED Final   Influenza A NOT DETECTED NOT DETECTED Final   Influenza B NOT DETECTED NOT DETECTED Final   Parainfluenza Virus 1 NOT DETECTED NOT DETECTED Final   Parainfluenza Virus 2 NOT DETECTED NOT DETECTED Final   Parainfluenza Virus 3 NOT DETECTED NOT DETECTED Final   Parainfluenza Virus 4 NOT DETECTED NOT DETECTED Final   Respiratory Syncytial Virus NOT DETECTED NOT DETECTED Final   Bordetella pertussis NOT DETECTED NOT DETECTED Final   Chlamydophila pneumoniae NOT DETECTED NOT DETECTED Final   Mycoplasma pneumoniae NOT DETECTED NOT DETECTED Final    Comment: Performed at [facility], [place] [address] [place],  [address]     Scheduled Meds: . budesonide (PULMICORT) nebulizer solution  0.5 mg Nebulization BID  . docusate sodium  100 mg Oral BID  . enoxaparin (LOVENOX) injection  40 mg Subcutaneous Q24H  . ferrous sulfate  325 mg Oral Q breakfast  . furosemide  40 mg Intravenous BID  . Glucerna  237 mL Oral Daily  . guaiFENesin  20 mL Oral TID  . ipratropium-albuterol  3 mL Nebulization Q6H WA  . mouth rinse  15 mL Mouth Rinse BID  . methylPREDNISolone (SOLU-MEDROL) injection  60 mg Intravenous Q12H  . pantoprazole  40 mg Oral Daily  . potassium chloride  20 mEq Oral Daily  . sodium chloride flush  3 mL Intravenous  Q12H   Continuous Infusions: . sodium chloride      Procedures/Studies: Dg Chest 2 View  Result Date: 06/20/2018 CLINICAL DATA:  Shortness of breath and productive cough for 1 week. EXAM: CHEST - 2 VIEW COMPARISON:  05/03/2018 FINDINGS: Stable mild cardiomegaly and ectasia of the thoracic aorta. Aortic atherosclerosis. New right lower lobe opacity and small right pleural effusion, suspicious for pneumonia. Left lung remains clear. IMPRESSION: New right lower lobe opacity and small right pleural effusion, suspicious for pneumonia. Recommend chest radiographic follow-up to ensure resolution. Electronically Signed   By: Earle Gell M.D.   On: 06/20/2018 10:05   Dg Chest Port 1 View  Result Date: 06/25/2018  CLINICAL DATA:  Shortness of breath, chronic kidney disease EXAM: PORTABLE CHEST 1 VIEW COMPARISON:  06/23/2010 FINDINGS: Cardiomegaly. Tortuous calcified aorta. Right upper mediastinal soft tissue prominence shown on prior CT to represent a combination of nodular goiter and prominent tortuous great vessels. Right basilar atelectasis or infiltrate with left base atelectasis. No definite visible effusions or acute bony abnormality. No significant change since prior study. IMPRESSION: No significant change since prior study. Electronically Signed   By: Rolm Baptise M.D.   On: 06/25/2018 07:53   Dg Chest Port 1 View  Result Date: 06/23/2018 CLINICAL DATA:  Difficulty breathing EXAM: PORTABLE CHEST 1 VIEW COMPARISON:  06/21/2018 FINDINGS: Cardiac shadow is stable. Aortic calcifications and tortuosity are again seen. Stable scoliosis is noted. Increasing consolidation in the right base is noted with right-sided pleural effusion. Persistent density in the right paratracheal region is seen related to tortuous vascularity IMPRESSION: Increasing right basilar consolidation and effusion. Electronically Signed   By: Inez Catalina M.D.   On: 06/23/2018 07:39   Dg Chest Port 1 View  Result Date:  06/21/2018 CLINICAL DATA:  Labored breathing EXAM: PORTABLE CHEST 1 VIEW COMPARISON:  06/20/2018, CT 05/03/2018, radiograph 04/30/2018 FINDINGS: Bilateral pleural effusions, right greater than left with continued airspace disease at the right lung base. Stable cardiomediastinal silhouette with aortic atherosclerosis. No pneumothorax. IMPRESSION: 1. Similar appearance of right greater than left pleural effusion with continued airspace disease at the right base which may reflect atelectasis or pneumonia Electronically Signed   By: Donavan Foil M.D.   On: 06/21/2018 02:04   Dg Swallowing Func-speech Pathology  Result Date: 06/25/2018 Objective Swallowing Evaluation: Type of Study: MBS-Modified Barium Swallow Study  Patient Details Name: SAUNDRA GIN MRN: [id number] Date of Birth: 06-Feb-1911 Today's Date: 06/25/2018 Time: SLP Start Time (ACUTE ONLY): 8315 -SLP Stop Time (ACUTE ONLY): 1329 SLP Time Calculation (min) (ACUTE ONLY): 23 min Past Medical History: Past Medical History: Diagnosis Date . Chronic anemia 05/01/2018 . Chronic diastolic heart failure ([place]) 06/12/2018 . CKD (chronic kidney disease), stage III ([place]) 05/01/2018 . Closed fracture of olecranon process of ulna 11/07/2009  Qualifier: Diagnosis of  By: Danne [place], Chasity L  . Constipation 05/01/2018 . CTS (carpal tunnel syndrome) 03/04/2011 . GERD (gastroesophageal reflux disease)  . RUPTURE ROTATOR CUFF 03/19/2010  Qualifier: Diagnosis of  By: Aline Brochure MD, Dorothyann Peng   Past Surgical History: Past Surgical History: Procedure Laterality Date . APPENDECTOMY    85 yr old . BREAST LUMPECTOMY   . CATARACT EXTRACTION   . ELBOW SURGERY   . HEMORROIDECTOMY   . HERNIA REPAIR   . INTRAMEDULLARY (IM) NAIL INTERTROCHANTERIC Left 05/01/2018  Procedure: LEFT BIOMET AFFIXUS (IM) SHORT NAIL, INTERTROCHANTRIC FRACTURE;  Surgeon: [name], MD;  Location: [facility];  Service: Orthopedics;  Laterality: Left; . SHOULDER SURGERY    left rotator cuff . TONSILLECTOMY AND ADENOIDECTOMY     11 yr . tubal removal   HPI: 85 y.o. female with medical history of diastolic CHF, GERD, and recent left hip fracture presenting with 1 to 2-day history of worsening shortness of breath.  Review the medical record shows that the patient has had some shortness of breath for approximately 1 week.  She denies any fevers, chills, chest pain but complains of a nonproductive cough and chest congestion.    Patient was recently admitted to the hospital from 04/30/2018 through 05/07/2018 secondary to a left hip fracture from mechanical fall.  She underwent ORIF of the left hip on 05/01/2018, and she was  discharged to the [facility] for rehab.  Since she has been at the [facility], it is been noted that she has had some hypoxia and worsening lower extremity edema.  Notably, the patient had her furosemide increased by her primary provider on 06/09/2018 from 20 mg daily to 40 mg daily.  During that office visit, the patient had a weight of 171 pounds.  However, it was noted on 05/28/2018 during a medical visit, the patient had a weight of 163 pounds.  Prior to her admission for her left hip fracture, the patient did not require any supplemental oxygen.  However since she has transitioned to the [facility] she has been placed on 2 L nasal cannula secondary to hypoxia and shortness of breath.  The patient does note some occasional orthopnea type symptoms with worsening lower extremity edema. BSE determined need for instrumental testing.  Subjective: "Today is the first day I have felt like eating anything. I want some bread." Assessment / Plan / Recommendation CHL IP CLINICAL IMPRESSIONS 06/25/2018 Clinical Impression Pt presents with mild/moderate sensorimotor oropharyngeal dysphagia characterized by consistent trace to minimal amount of penetration of thin liquids during the swallow to the level of the cords with subsequent silent aspiration after the swallow secondary to decreased Laryngeal vestibule closure (LVC). Oral  phase is characterized by decreased bolus cohesion and premature spillage to the level of the pyriforms; mild to mod residue of thin liquids pooling in the valleculae and pyriforms after the swallow of which she is not sensate to. Consistent pharyngeal residue of all consistencies noted after the swallow secondary to decreased strength and duration of pharyngeal squeeze during the swallow. Volume dependent improved LVC manifested by no penetration or aspiration visualized with NTL or puree trials. Difficulty with oral AP tranist of pill requiring multiple trials and consistencies to transfer; Pill with stasis in the valleculae requiring again multiple trials of NTL and puree to pass through pharyngeal space. Esophageal sweep reveals stasis of the pill in the upper esophagus with what appeared to be reduced esophageal paristalsis and despite multiple trials and additional administrations was not visualized passing through the distal esophagus. No Radiologist present to confirm. Recommend continue D3/mechanical soft diet with NTL  with precautions re: small sips/bites, alternate bites and sips, take additional dry swallows throughout meal, always be seated upright for all PO. Recommend meds be administered crushed in puree. ST will continue to follow for diet tolerance and family/patient education.  SLP Visit Diagnosis Dysphagia, oropharyngeal phase (R13.12) Attention and concentration deficit following -- Frontal lobe and executive function deficit following -- Impact on safety and function Mild aspiration risk;Moderate aspiration risk   CHL IP TREATMENT RECOMMENDATION 06/25/2018 Treatment Recommendations Therapy as outlined in treatment plan below   Prognosis 06/25/2018 Prognosis for Safe Diet Advancement Good Barriers to Reach Goals -- Barriers/Prognosis Comment -- CHL IP DIET RECOMMENDATION 06/25/2018 SLP Diet Recommendations Dysphagia 3 (Mech soft) solids;Nectar thick liquid Liquid Administration via -- Medication  Administration Crushed with puree Compensations Slow rate;Small sips/bites;Lingual sweep for clearance of pocketing Postural Changes --   CHL IP OTHER RECOMMENDATIONS 06/25/2018 Recommended Consults -- Oral Care Recommendations Oral care BID Other Recommendations Order thickener from pharmacy   CHL IP FOLLOW UP RECOMMENDATIONS 06/25/2018 Follow up Recommendations 24 hour supervision/assistance;Skilled Nursing facility   [facility] IP FREQUENCY AND DURATION 06/25/2018 Speech Therapy Frequency (ACUTE ONLY) min 2x/week Treatment Duration 1 week      CHL IP ORAL PHASE 06/25/2018 Oral Phase Impaired Oral - Pudding Teaspoon -- Oral -  Pudding Cup -- Oral - Honey Teaspoon -- Oral - Honey Cup -- Oral - Nectar Teaspoon -- Oral - Nectar Cup Premature spillage;Decreased bolus cohesion Oral - Nectar Straw -- Oral - Thin Teaspoon -- Oral - Thin Cup Premature spillage;Decreased bolus cohesion;Piecemeal swallowing;Right anterior bolus loss Oral - Thin Straw Premature spillage;Delayed oral transit;Decreased bolus cohesion;Right anterior bolus loss Oral - Puree Premature spillage;Holding of bolus;Reduced posterior propulsion Oral - Mech Soft -- Oral - Regular -- Oral - Multi-Consistency -- Oral - Pill Premature spillage;Holding of bolus;Reduced posterior propulsion;Lingual pumping Oral Phase - Comment --  CHL IP PHARYNGEAL PHASE 06/25/2018 Pharyngeal Phase -- Pharyngeal- Pudding Teaspoon -- Pharyngeal -- Pharyngeal- Pudding Cup -- Pharyngeal -- Pharyngeal- Honey Teaspoon -- Pharyngeal -- Pharyngeal- Honey Cup -- Pharyngeal -- Pharyngeal- Nectar Teaspoon -- Pharyngeal -- Pharyngeal- Nectar Cup -- Pharyngeal -- Pharyngeal- Nectar Straw -- Pharyngeal -- Pharyngeal- Thin Teaspoon -- Pharyngeal -- Pharyngeal- Thin Cup -- Pharyngeal Material enters airway, passes BELOW cords without attempt by patient to eject out (silent aspiration) Pharyngeal- Thin Straw -- Pharyngeal Material enters airway, passes BELOW cords without attempt by patient to eject out  (silent aspiration) Pharyngeal- Puree -- Pharyngeal -- Pharyngeal- Mechanical Soft -- Pharyngeal -- Pharyngeal- Regular -- Pharyngeal -- Pharyngeal- Multi-consistency -- Pharyngeal -- Pharyngeal- Pill -- Pharyngeal -- Pharyngeal Comment --  CHL IP CERVICAL ESOPHAGEAL PHASE 06/25/2018 Cervical Esophageal Phase Impaired Pudding Teaspoon -- Pudding Cup -- Honey Teaspoon -- Honey Cup -- Nectar Teaspoon -- Nectar Cup -- Nectar Straw -- Thin Teaspoon -- Thin Cup -- Thin Straw -- Puree -- Mechanical Soft -- Regular -- Multi-consistency -- Pill -- Cervical Esophageal Comment -- Amelia H. Roddie Mc, CCC-SLP Speech Language Pathologist Wende Bushy 06/25/2018, 3:30 PM              Xr Hip Unilat W Or W/o Pelvis 2-3 Views Left  Result Date: 06/17/2018 AP lateral left hip obtained and reviewed this shows trochanteric nail with interval healing.  No evidence of loosening or subsidence. Impression: Satisfactory postop ORIF left hip fracture with truck nail.   Barton Dubois, MD  [facility] Pager [phone]   06/26/2018, 3:39 PM   LOS: 6 days

## 2018-06-27 LAB — BASIC METABOLIC PANEL
Anion gap: 9 (ref 5–15)
BUN: 45 mg/dL — ABNORMAL HIGH (ref 8–23)
CO2: 46 mmol/L — ABNORMAL HIGH (ref 22–32)
Calcium: 8.7 mg/dL — ABNORMAL LOW (ref 8.9–10.3)
Chloride: 86 mmol/L — ABNORMAL LOW (ref 98–111)
Creatinine, Ser: 0.73 mg/dL (ref 0.44–1.00)
GFR calc non Af Amer: >60 mL/min (ref >60)
Glucose, Bld: 98 mg/dL (ref 70–99)
Potassium: 3.5 mmol/L (ref 3.5–5.1)
Sodium: 141 mmol/L (ref 135–145)

## 2018-06-27 NOTE — Progress Notes (Signed)
PROGRESS NOTE  NOLENE ROCKS HCW:[id number] DOB: [birth year] DOA: 06/20/2018 PCP: Celene Squibb, MD  Brief History:  85 y.o.femalewith medical history ofdiastolic CHF, GERD, and recent left hip fracture presenting with 1 to 2-day history of worsening shortness of breath. Review the medical record shows that the patient has had some shortness of breath for approximately 1 week. She denies any fevers, chills, chest pain but complains of a nonproductive cough and chest congestion.  Patient was recently admitted to the hospital from 04/30/2018 through 05/07/2018 secondary to a left hip fracture from mechanical fall. She underwent ORIF of the left hip on 05/01/2018, and she was discharged to the [facility] for rehab. Since she has been at the [facility], it is been noted that she has had some hypoxia and worsening lower extremity edema. Notably, the patient had her furosemide increased by her primary provider on 06/09/2018 from 20 mg daily to 40 mg daily. During that office visit, the patient had a weight of 171 pounds. However, it was noted on 05/28/2018 during a medical visit, the patient had a weight of 163 pounds. Prior to her admission for her left hip fracture, the patient did not require any supplemental oxygen. However since she has transitioned to the [facility], she has been placed on 2 L nasal cannula secondary to hypoxia and shortness of breath.  The patient does note some occasional orthopnea type symptoms with worsening lower extremity edema. In the emergency department, the patient was afebrile and hemodynamically stable saturating 94-95% on 2 L. BMP, LFTs, CBC were essentially unremarkable.Hemoglobin was 9.2 which is at her usual baseline. BMP was 434.0. Troponin was 0.15. Chest x-ray showed right lower lobe opacity with increased interstitial markings. The patient was started on vancomycin and cefepime.  In the early am 06/21/18, pt had respiratory distress  requiring BiPAP for short period of time.  Repeat cxr did not show much change from 06/20/18.  Assessment/Plan: Acute on chronic diastolic CHF -Significant improvement in patient's volume and urine output  -continue oral Lasix. -Renal function has remained within normal limits. -continue daily weight and acute I's and O's -[id number]--RS85-46%, +diastolic dysfx, RVSP 27.0 -low sodium diet education provided -continue weaning oxygen supplementation as tolerated.   Acute on Chronic respiratory failure with hypoxia -secondary to CHF and pneumonia -Patient has been chronically on 2 L nasal cannula at SNF. -pt required BiPAP early am 06/21/18-->now back to HFNC 5L -continue diuresis; but now transition to oral regimen. -Last antibiotic therapy date on 06/25/2018 -Patient is afebrile and denying major component of SOB.  Elevated troponin -Patient has no chest pain -trend is flat and representing demand ischemia -personally reviewed EKG--sinus, no STT changes  Lobar pneumonia/HCAP -Continue cefepime -06/18/2018 influenza PCR negative -continue duonebs and pulmicort -continue oral steroids tapering. -viral respiratory panel--neg -last antibiotic therapy on 06/25/2018.  GERD -Continue PPI -denying any abd pain, nausea or vomiting.  Recent left hip fracture -05/01/2018 ORIF left hip -PT evaluation -continue Tramadol PRN pain   Thrombocytopenia -has been chronic intermittent -drop due to acute illness -serum J50--093 -serum folic GHWE--99 -follow platelets trend.  Hypokalemia -Continue repletion as needed. -In the setting of diuresis   Disposition Plan:    Discussed with daughter-in-law and son at bedside on 06/25/2018.  They are concerned of the poor overall response and quality since January after breaking her hip.  Despite improvement in her fluid overload and pneumonia process, they feel that the patient  will better be served under hospice care.  Transition diuresis to  oral regimen and also transition steroids to oral.  Continue weaning oxygen supplementation as tolerated.  Family Communication:    No family at bedside.  Consultants: palliative care/hospice care  Code Status:  FULL   DVT Prophylaxis:   Lovenox   Procedures: As Listed in Progress Note Above  Antibiotics: vanco 3/1>>3/2 Cefepime 3/2>>   Subjective: No chest pain, no palpitations, no nausea, no vomiting.  Continues to have good urine output after transition to oral diuretic regimen.  Wearing 2.5 L nasal cannula supplementation at baseline with good oxygen saturation.  Objective: Vitals:   06/26/18 1924 06/26/18 2202 06/27/18 0500 06/27/18 0730  BP:  (!) 115/54 (!) 141/74   Pulse:  75 78   Resp:  20    Temp:  98.9 F (37.2 C) 98.2 F (36.8 C)   TempSrc:  Oral Axillary   SpO2: 94% 98% 100% 98%  Weight:      Height:        Intake/Output Summary (Last 24 hours) at 06/27/2018 0902 Last data filed at 06/27/2018 0500 Gross per 24 hour  Intake 240 ml  Output 1500 ml  Net -1260 ml   Weight change:    Exam: General exam: Afebrile; no chest pain, no nausea, no vomiting.  Patient using 2-2.5 L nasal cannula supplementation.  Significant improvement overall in her volume status. Respiratory system: Normal respiratory effort, no using accessory muscles.  Positive scattered rhonchi.  No frank crackles, no wheezing. Cardiovascular system:RRR. No murmurs, rubs, gallops. Gastrointestinal system: Abdomen is nondistended, soft and nontender. No organomegaly or masses felt. Normal bowel sounds heard. Central nervous system: Alert and oriented. No focal neurological deficits. Extremities: No cyanosis or clubbing; trace edema bilaterally. Skin: No rashes, no petechiae; multiple bruises from recent blood draws appreciated on upper extremities Psychiatry: Normal mood and affect.  Data Reviewed: I have personally reviewed following labs and imaging studies  Basic Metabolic  Panel: Recent Labs  Lab 06/22/18 0531 06/23/18 0538 06/25/18 0520 06/26/18 0705 06/27/18 0706  NA 145 143 142 140 141  K 3.9 4.4 3.9 3.4* 3.5  CL 98 97* 93* 86* 86*  CO2 38* 39* 41* 43* 46*  GLUCOSE 172* 172* 150* 105* 98  BUN 30* 41* 46* 46* 45*  CREATININE 0.85 0.82 0.83 0.75 0.73  CALCIUM 8.6* 8.4* 8.9 9.1 8.7*  MG 1.8  --   --   --   --    Liver Function Tests: Recent Labs  Lab 06/20/18 0855  AST 17  ALT 10  ALKPHOS 57  BILITOT 0.8  PROT 5.7*  ALBUMIN 2.7*   CBC: Recent Labs  Lab 06/20/18 0855 06/21/18 0542 06/22/18 0531  WBC 7.1 4.2 3.0*  NEUTROABS 5.0  --  2.2  HGB 9.2* 9.8* 9.4*  HCT 31.3* 32.5* 31.2*  MCV 104.3* 103.5* 103.7*  PLT 163 154 154   Cardiac Enzymes: Recent Labs  Lab 06/20/18 0855 06/20/18 1329 06/20/18 2022  TROPONINI 0.15* 0.14* 0.11*   Urine analysis:    Component Value Date/Time   COLORURINE AMBER (A) 05/05/2018 1446   APPEARANCEUR CLOUDY (A) 05/05/2018 1446   LABSPEC 1.028 05/05/2018 1446   PHURINE 5.0 05/05/2018 1446   GLUCOSEU NEGATIVE 05/05/2018 1446   HGBUR MODERATE (A) 05/05/2018 1446   BILIRUBINUR NEGATIVE 05/05/2018 1446   KETONESUR NEGATIVE 05/05/2018 1446   PROTEINUR 30 (A) 05/05/2018 1446   UROBILINOGEN 0.2 05/11/2013 1548   NITRITE NEGATIVE 05/05/2018 1446  LEUKOCYTESUR MODERATE (A) 05/05/2018 1446    Recent Results (from the past 240 hour(s))  MRSA PCR Screening     Status: None   Collection Time: 06/20/18 11:40 AM  Result Value Ref Range Status   MRSA by PCR NEGATIVE NEGATIVE Final    Comment:        The GeneXpert MRSA Assay (FDA approved for NASAL specimens only), is one component of a comprehensive MRSA colonization surveillance program. It is not intended to diagnose MRSA infection nor to guide or monitor treatment for MRSA infections. Performed at [facility], [address] [place], [place] [address]   Respiratory Panel by PCR     Status: None   Collection Time: 06/21/18 10:15 AM  Result  Value Ref Range Status   Adenovirus NOT DETECTED NOT DETECTED Final   Coronavirus 229E NOT DETECTED NOT DETECTED Final    Comment: (NOTE) The Coronavirus on the Respiratory Panel, DOES NOT test for the novel  Coronavirus (2019 nCoV)    Coronavirus HKU1 NOT DETECTED NOT DETECTED Final   Coronavirus NL63 NOT DETECTED NOT DETECTED Final   Coronavirus OC43 NOT DETECTED NOT DETECTED Final   Metapneumovirus NOT DETECTED NOT DETECTED Final   Rhinovirus / Enterovirus NOT DETECTED NOT DETECTED Final   Influenza A NOT DETECTED NOT DETECTED Final   Influenza B NOT DETECTED NOT DETECTED Final   Parainfluenza Virus 1 NOT DETECTED NOT DETECTED Final   Parainfluenza Virus 2 NOT DETECTED NOT DETECTED Final   Parainfluenza Virus 3 NOT DETECTED NOT DETECTED Final   Parainfluenza Virus 4 NOT DETECTED NOT DETECTED Final   Respiratory Syncytial Virus NOT DETECTED NOT DETECTED Final   Bordetella pertussis NOT DETECTED NOT DETECTED Final   Chlamydophila pneumoniae NOT DETECTED NOT DETECTED Final   Mycoplasma pneumoniae NOT DETECTED NOT DETECTED Final    Comment: Performed at [facility], [place] [address] [place], [place] [address]     Scheduled Meds: . budesonide (PULMICORT) nebulizer solution  0.5 mg Nebulization BID  . docusate sodium  100 mg Oral BID  . enoxaparin (LOVENOX) injection  40 mg Subcutaneous Q24H  . ferrous sulfate  325 mg Oral Q breakfast  . furosemide  40 mg Oral BID  . Glucerna  237 mL Oral Daily  . guaiFENesin  20 mL Oral TID  . ipratropium-albuterol  3 mL Nebulization Q6H WA  . mouth rinse  15 mL Mouth Rinse BID  . pantoprazole  40 mg Oral Daily  . potassium chloride  20 mEq Oral Daily  . predniSONE  30 mg Oral Q breakfast  . sodium chloride flush  3 mL Intravenous Q12H   Continuous Infusions: . sodium chloride      Procedures/Studies: Dg Chest 2 View  Result Date: 06/20/2018 CLINICAL DATA:  Shortness of breath and productive cough for 1 week. EXAM: CHEST - 2 VIEW  COMPARISON:  05/03/2018 FINDINGS: Stable mild cardiomegaly and ectasia of the thoracic aorta. Aortic atherosclerosis. New right lower lobe opacity and small right pleural effusion, suspicious for pneumonia. Left lung remains clear. IMPRESSION: New right lower lobe opacity and small right pleural effusion, suspicious for pneumonia. Recommend chest radiographic follow-up to ensure resolution. Electronically Signed   By: Earle Gell M.D.   On: 06/20/2018 10:05   Dg Chest Port 1 View  Result Date: 06/25/2018 CLINICAL DATA:  Shortness of breath, chronic kidney disease EXAM: PORTABLE CHEST 1 VIEW COMPARISON:  06/23/2010 FINDINGS: Cardiomegaly. Tortuous calcified aorta. Right upper mediastinal soft tissue prominence shown on prior CT  to represent a combination of nodular goiter and prominent tortuous great vessels. Right basilar atelectasis or infiltrate with left base atelectasis. No definite visible effusions or acute bony abnormality. No significant change since prior study. IMPRESSION: No significant change since prior study. Electronically Signed   By: Rolm Baptise M.D.   On: 06/25/2018 07:53   Dg Chest Port 1 View  Result Date: 06/23/2018 CLINICAL DATA:  Difficulty breathing EXAM: PORTABLE CHEST 1 VIEW COMPARISON:  06/21/2018 FINDINGS: Cardiac shadow is stable. Aortic calcifications and tortuosity are again seen. Stable scoliosis is noted. Increasing consolidation in the right base is noted with right-sided pleural effusion. Persistent density in the right paratracheal region is seen related to tortuous vascularity IMPRESSION: Increasing right basilar consolidation and effusion. Electronically Signed   By: Inez Catalina M.D.   On: 06/23/2018 07:39   Dg Chest Port 1 View  Result Date: 06/21/2018 CLINICAL DATA:  Labored breathing EXAM: PORTABLE CHEST 1 VIEW COMPARISON:  06/20/2018, CT 05/03/2018, radiograph 04/30/2018 FINDINGS: Bilateral pleural effusions, right greater than left with continued airspace disease  at the right lung base. Stable cardiomediastinal silhouette with aortic atherosclerosis. No pneumothorax. IMPRESSION: 1. Similar appearance of right greater than left pleural effusion with continued airspace disease at the right base which may reflect atelectasis or pneumonia Electronically Signed   By: Donavan Foil M.D.   On: 06/21/2018 02:04   Dg Swallowing Func-speech Pathology  Result Date: 06/25/2018 Objective Swallowing Evaluation: Type of Study: MBS-Modified Barium Swallow Study  Patient Details Name: CARRELL PALMATIER MRN: [id number] Date of Birth: 24-Jun-1910 Today's Date: 06/25/2018 Time: SLP Start Time (ACUTE ONLY): 9381 -SLP Stop Time (ACUTE ONLY): 1329 SLP Time Calculation (min) (ACUTE ONLY): 23 min Past Medical History: Past Medical History: Diagnosis Date . Chronic anemia 05/01/2018 . Chronic diastolic heart failure ([place]) 06/12/2018 . CKD (chronic kidney disease), stage III ([place]) 05/01/2018 . Closed fracture of olecranon process of ulna 11/07/2009  Qualifier: Diagnosis of  By: Danne Harbor, Chasity L  . Constipation 05/01/2018 . CTS (carpal tunnel syndrome) 03/04/2011 . GERD (gastroesophageal reflux disease)  . RUPTURE ROTATOR CUFF 03/19/2010  Qualifier: Diagnosis of  By: Aline Brochure MD, Dorothyann Peng   Past Surgical History: Past Surgical History: Procedure Laterality Date . APPENDECTOMY    85 yr old . BREAST LUMPECTOMY   . CATARACT EXTRACTION   . ELBOW SURGERY   . HEMORROIDECTOMY   . HERNIA REPAIR   . INTRAMEDULLARY (IM) NAIL INTERTROCHANTERIC Left 05/01/2018  Procedure: LEFT BIOMET AFFIXUS (IM) SHORT NAIL, INTERTROCHANTRIC FRACTURE;  Surgeon: [name], MD;  Location: [facility];  Service: Orthopedics;  Laterality: Left; . SHOULDER SURGERY    left rotator cuff . TONSILLECTOMY AND ADENOIDECTOMY    9 yr . tubal removal   HPI: 85 y.o. female with medical history of diastolic CHF, GERD, and recent left hip fracture presenting with 1 to 2-day history of worsening shortness of breath.  Review the medical record shows that  the patient has had some shortness of breath for approximately 1 week.  She denies any fevers, chills, chest pain but complains of a nonproductive cough and chest congestion.    Patient was recently admitted to the hospital from 04/30/2018 through 05/07/2018 secondary to a left hip fracture from mechanical fall.  She underwent ORIF of the left hip on 05/01/2018, and she was discharged to the [facility] for rehab.  Since she has been at the [facility], it is been noted that she has had some hypoxia and worsening lower extremity edema.  Notably, the patient had her furosemide increased by her primary provider on 06/09/2018 from 20 mg daily to 40 mg daily.  During that office visit, the patient had a weight of 171 pounds.  However, it was noted on 05/28/2018 during a medical visit, the patient had a weight of 163 pounds.  Prior to her admission for her left hip fracture, the patient did not require any supplemental oxygen.  However since she has transitioned to the [facility], she has been placed on 2 L nasal cannula secondary to hypoxia and shortness of breath.  The patient does note some occasional orthopnea type symptoms with worsening lower extremity edema. BSE determined need for instrumental testing.  Subjective: "Today is the first day I have felt like eating anything. I want some bread." Assessment / Plan / Recommendation CHL IP CLINICAL IMPRESSIONS 06/25/2018 Clinical Impression Pt presents with mild/moderate sensorimotor oropharyngeal dysphagia characterized by consistent trace to minimal amount of penetration of thin liquids during the swallow to the level of the cords with subsequent silent aspiration after the swallow secondary to decreased Laryngeal vestibule closure (LVC). Oral phase is characterized by decreased bolus cohesion and premature spillage to the level of the pyriforms; mild to mod residue of thin liquids pooling in the valleculae and pyriforms after the swallow of which she is not sensate  to. Consistent pharyngeal residue of all consistencies noted after the swallow secondary to decreased strength and duration of pharyngeal squeeze during the swallow. Volume dependent improved LVC manifested by no penetration or aspiration visualized with NTL or puree trials. Difficulty with oral AP tranist of pill requiring multiple trials and consistencies to transfer; Pill with stasis in the valleculae requiring again multiple trials of NTL and puree to pass through pharyngeal space. Esophageal sweep reveals stasis of the pill in the upper esophagus with what appeared to be reduced esophageal paristalsis and despite multiple trials and additional administrations was not visualized passing through the distal esophagus. No Radiologist present to confirm. Recommend continue D3/mechanical soft diet with NTL  with precautions re: small sips/bites, alternate bites and sips, take additional dry swallows throughout meal, always be seated upright for all PO. Recommend meds be administered crushed in puree. ST will continue to follow for diet tolerance and family/patient education.  SLP Visit Diagnosis Dysphagia, oropharyngeal phase (R13.12) Attention and concentration deficit following -- Frontal lobe and executive function deficit following -- Impact on safety and function Mild aspiration risk;Moderate aspiration risk   CHL IP TREATMENT RECOMMENDATION 06/25/2018 Treatment Recommendations Therapy as outlined in treatment plan below   Prognosis 06/25/2018 Prognosis for Safe Diet Advancement Good Barriers to Reach Goals -- Barriers/Prognosis Comment -- CHL IP DIET RECOMMENDATION 06/25/2018 SLP Diet Recommendations Dysphagia 3 (Mech soft) solids;Nectar thick liquid Liquid Administration via -- Medication Administration Crushed with puree Compensations Slow rate;Small sips/bites;Lingual sweep for clearance of pocketing Postural Changes --   CHL IP OTHER RECOMMENDATIONS 06/25/2018 Recommended Consults -- Oral Care Recommendations Oral  care BID Other Recommendations Order thickener from pharmacy   CHL IP FOLLOW UP RECOMMENDATIONS 06/25/2018 Follow up Recommendations 24 hour supervision/assistance;Skilled Nursing facility   [facility] IP FREQUENCY AND DURATION 06/25/2018 Speech Therapy Frequency (ACUTE ONLY) min 2x/week Treatment Duration 1 week      CHL IP ORAL PHASE 06/25/2018 Oral Phase Impaired Oral - Pudding Teaspoon -- Oral - Pudding Cup -- Oral - Honey Teaspoon -- Oral - Honey Cup -- Oral - Nectar Teaspoon -- Oral - Nectar Cup Premature spillage;Decreased bolus cohesion Oral - Nectar Straw -- Oral -  Thin Teaspoon -- Oral - Thin Cup Premature spillage;Decreased bolus cohesion;Piecemeal swallowing;Right anterior bolus loss Oral - Thin Straw Premature spillage;Delayed oral transit;Decreased bolus cohesion;Right anterior bolus loss Oral - Puree Premature spillage;Holding of bolus;Reduced posterior propulsion Oral - Mech Soft -- Oral - Regular -- Oral - Multi-Consistency -- Oral - Pill Premature spillage;Holding of bolus;Reduced posterior propulsion;Lingual pumping Oral Phase - Comment --  CHL IP PHARYNGEAL PHASE 06/25/2018 Pharyngeal Phase -- Pharyngeal- Pudding Teaspoon -- Pharyngeal -- Pharyngeal- Pudding Cup -- Pharyngeal -- Pharyngeal- Honey Teaspoon -- Pharyngeal -- Pharyngeal- Honey Cup -- Pharyngeal -- Pharyngeal- Nectar Teaspoon -- Pharyngeal -- Pharyngeal- Nectar Cup -- Pharyngeal -- Pharyngeal- Nectar Straw -- Pharyngeal -- Pharyngeal- Thin Teaspoon -- Pharyngeal -- Pharyngeal- Thin Cup -- Pharyngeal Material enters airway, passes BELOW cords without attempt by patient to eject out (silent aspiration) Pharyngeal- Thin Straw -- Pharyngeal Material enters airway, passes BELOW cords without attempt by patient to eject out (silent aspiration) Pharyngeal- Puree -- Pharyngeal -- Pharyngeal- Mechanical Soft -- Pharyngeal -- Pharyngeal- Regular -- Pharyngeal -- Pharyngeal- Multi-consistency -- Pharyngeal -- Pharyngeal- Pill -- Pharyngeal -- Pharyngeal  Comment --  CHL IP CERVICAL ESOPHAGEAL PHASE 06/25/2018 Cervical Esophageal Phase Impaired Pudding Teaspoon -- Pudding Cup -- Honey Teaspoon -- Honey Cup -- Nectar Teaspoon -- Nectar Cup -- Nectar Straw -- Thin Teaspoon -- Thin Cup -- Thin Straw -- Puree -- Mechanical Soft -- Regular -- Multi-consistency -- Pill -- Cervical Esophageal Comment -- Amelia H. Roddie Mc, CCC-SLP Speech Language Pathologist Wende Bushy 06/25/2018, 3:30 PM              Xr Hip Unilat W Or W/o Pelvis 2-3 Views Left  Result Date: 06/17/2018 AP lateral left hip obtained and reviewed this shows trochanteric nail with interval healing.  No evidence of loosening or subsidence. Impression: Satisfactory postop ORIF left hip fracture with truck nail.   Barton Dubois, MD  [facility] Pager [phone]   06/27/2018, 9:02 AM   LOS: 7 days

## 2018-06-28 ENCOUNTER — Inpatient Hospital Stay
Admission: RE | Admit: 2018-06-28 | Discharge: 2018-07-21 | Disposition: E | Payer: Medicare HMO | Source: Ambulatory Visit | Attending: Internal Medicine | Admitting: Internal Medicine

## 2018-06-28 DIAGNOSIS — Z7189 Other specified counseling: Secondary | ICD-10-CM

## 2018-06-28 DIAGNOSIS — J810 Acute pulmonary edema: Secondary | ICD-10-CM

## 2018-06-28 DIAGNOSIS — J811 Chronic pulmonary edema: Secondary | ICD-10-CM

## 2018-06-28 DIAGNOSIS — Z515 Encounter for palliative care: Secondary | ICD-10-CM

## 2018-06-28 DIAGNOSIS — I5033 Acute on chronic diastolic (congestive) heart failure: Secondary | ICD-10-CM

## 2018-06-28 DIAGNOSIS — Z789 Other specified health status: Secondary | ICD-10-CM

## 2018-06-28 LAB — BASIC METABOLIC PANEL
Anion gap: 8 (ref 5–15)
BUN: 41 mg/dL — ABNORMAL HIGH (ref 8–23)
CO2: 42 mmol/L — ABNORMAL HIGH (ref 22–32)
Calcium: 8.5 mg/dL — ABNORMAL LOW (ref 8.9–10.3)
Chloride: 89 mmol/L — ABNORMAL LOW (ref 98–111)
Creatinine, Ser: 0.69 mg/dL (ref 0.44–1.00)
GFR calc Af Amer: >60 mL/min (ref >60)
GFR calc non Af Amer: >60 mL/min (ref >60)
Glucose, Bld: 104 mg/dL — ABNORMAL HIGH (ref 70–99)
Potassium: 3.2 mmol/L — ABNORMAL LOW (ref 3.5–5.1)
SODIUM: 139 mmol/L (ref 135–145)

## 2018-06-28 MED ORDER — IPRATROPIUM-ALBUTEROL 0.5-2.5 (3) MG/3ML IN SOLN: 3.0000 mL | Freq: Three times a day (TID) | RESPIRATORY_TRACT | Status: AC

## 2018-06-28 MED ORDER — POTASSIUM CHLORIDE CRYS ER 20 MEQ PO TBCR
40.0000 meq | EXTENDED_RELEASE_TABLET | Freq: Every day | ORAL | Status: DC
  Administered 2018-06-28: 40 meq via ORAL
  Filled 2018-06-28: qty 2

## 2018-06-28 MED ORDER — FUROSEMIDE 40 MG PO TABS: 40.0000 mg | ORAL_TABLET | Freq: Two times a day (BID) | ORAL | Status: AC

## 2018-06-28 MED ORDER — GLUCERNA PO LIQD: 237.0000 mL | Freq: Every day | ORAL | Status: AC

## 2018-06-28 MED ORDER — POTASSIUM CHLORIDE CRYS ER 20 MEQ PO TBCR: 40.0000 meq | EXTENDED_RELEASE_TABLET | Freq: Every day | ORAL | Status: AC

## 2018-06-28 MED ORDER — PREDNISONE 20 MG PO TABS: 20.0000 mg | ORAL_TABLET | Freq: Every day | ORAL | Status: DC

## 2018-06-28 MED ORDER — BUDESONIDE 0.5 MG/2ML IN SUSP: 0.5000 mg | Freq: Two times a day (BID) | RESPIRATORY_TRACT | Status: AC

## 2018-06-28 MED ORDER — PREDNISONE 20 MG PO TABS: 20.0000 mg | ORAL_TABLET | Freq: Every day | ORAL | Status: AC

## 2018-06-28 NOTE — Consult Note (Signed)
Consultation Note Date: 06/27/2018   Patient Name: Hannah Arias  DOB: Mar 18, 1911  MRN: [id number]  Age / Sex: 85 y.o., female  PCP: Celene Squibb, MD Referring Physician: Barton Dubois, MD  Reason for Consultation: Establishing goals of care  HPI/Patient Profile: 85 y.o. female  with past medical history of diastolic CHF, CKD stage 3, GERD, recent left hip fracture and surgical repair (admission 04/30/18-05/07/18) admitted on 06/20/2018 with dyspnea, cough, BLE edema r/t acute on chronic diastolic CHF and HCAP.   Clinical Assessment and Goals of Care: I have met today at Ms. Begin's bedside with her grandson, Reita Cliche, and then with her son, Lissa Merlin and his wife Levander Campion. I discussed with Lissa Merlin and Levander Campion as well as HCPOA/[place] over telephone. Family all confirm the desire for hospice to follow along with her at [facility]. They feel that her quality of life has significantly declined since her hip fracture and would like to have help with optimizing her QOL for the time she has left which is why they have requested hospice.   I did speak further with Algis Downs, and Vertell Limber regarding MOST form. They have initially chosen: DNR, comfort measures (return to hospital only if cannot be made comfortable), decide limits/benefits of antibiotics at time of infection, trial of IVF, NO feeding tube. Vertell Limber is not present in person so Levander Campion will take MOST form for him to sign and I have signed my part. They will call me when complete and will call if they wish to make any changes.   All questions/concerns addressed. Emotional support provided. Ms. Macfadden is anxious to return to [facility].   Late Entry: Dianne has returned with signed MOST form from [facility]. Copy placed in shadow chart and original form placed in discharge folder along with DNR form.   Primary Decision Maker HCPOA Vertell Limber (and Daryle but Daryle referred me  to [facility] for decisions)    SUMMARY OF RECOMMENDATIONS   - Return to SNF with hospice in place - MOST completed as above  Code Status/Advance Care Planning:  DNR   Symptom Management:   Lasix and oxygen for support of CHF  No complaints  Hospice to manage at SNF  Palliative Prophylaxis:   Bowel Regimen, Delirium Protocol, Frequent Pain Assessment, Oral Care and Turn Reposition  Additional Recommendations (Limitations, Scope, Preferences):  Avoid Hospitalization  Psycho-social/Spiritual:   Desire for further Chaplaincy support:no  Additional Recommendations: Caregiving  Support/Resources and Education on Hospice  Prognosis:   < 6 months  Discharge Planning: [place] with Hospice      Primary Diagnoses: Present on Admission: . Acute on chronic diastolic CHF (congestive heart failure) ([place])   I have reviewed the medical record, interviewed the patient and family, and examined the patient. The following aspects are pertinent.  Past Medical History:  Diagnosis Date  . Chronic anemia 05/01/2018  . Chronic diastolic heart failure ([place]) 06/12/2018  . CKD (chronic kidney disease), stage III ([place]) 05/01/2018  . Closed fracture of olecranon process of ulna 11/07/2009  Qualifier: Diagnosis of  By: Danne Harbor, Chasity L   . Constipation 05/01/2018  . CTS (carpal tunnel syndrome) 03/04/2011  . GERD (gastroesophageal reflux disease)   . RUPTURE ROTATOR CUFF 03/19/2010   Qualifier: Diagnosis of  By: Aline Brochure MD, Dorothyann Peng     Social History   Socioeconomic History  . Marital status: Widowed    Spouse name: Not on file  . Number of children: Not on file  . Years of education: Not on file  . Highest education level: Not on file  Occupational History  . Not on file  Social Needs  . Financial resource strain: Not on file  . Food insecurity:    Worry: Not on file    Inability: Not on file  . Transportation needs:    Medical: Not on file     Non-medical: Not on file  Tobacco Use  . Smoking status: Never Smoker  . Smokeless tobacco: Never Used  Substance and Sexual Activity  . Alcohol use: No  . Drug use: Not on file  . Sexual activity: Not on file  Lifestyle  . Physical activity:    Days per week: Not on file    Minutes per session: Not on file  . Stress: Not on file  Relationships  . Social connections:    Talks on phone: Not on file    Gets together: Not on file    Attends religious service: Not on file    Active member of club or organization: Not on file    Attends meetings of clubs or organizations: Not on file    Relationship status: Not on file  Other Topics Concern  . Not on file  Social History Narrative  . Not on file   Family History  Problem Relation Age of Onset  . Cancer Other   . Diabetes Other    Scheduled Meds: . budesonide (PULMICORT) nebulizer solution  0.5 mg Nebulization BID  . docusate sodium  100 mg Oral BID  . enoxaparin (LOVENOX) injection  40 mg Subcutaneous Q24H  . ferrous sulfate  325 mg Oral Q breakfast  . furosemide  40 mg Oral BID  . Glucerna  237 mL Oral Daily  . guaiFENesin  20 mL Oral TID  . ipratropium-albuterol  3 mL Nebulization Q6H WA  . mouth rinse  15 mL Mouth Rinse BID  . pantoprazole  40 mg Oral Daily  . potassium chloride  40 mEq Oral Daily  . [START ON 06/29/2018] predniSONE  20 mg Oral Q breakfast  . sodium chloride flush  3 mL Intravenous Q12H   Continuous Infusions: . sodium chloride     PRN Meds:.sodium chloride, acetaminophen, LORazepam, ondansetron (ZOFRAN) IV, polyethylene glycol, Resource ThickenUp Clear, simethicone, sodium chloride flush, traMADol Allergies  Allergen Reactions  . Aspirin     Stomach problem  . Codeine     hallucinate  . Penicillins Rash    Has patient had a PCN reaction causing immediate rash, facial/tongue/throat swelling, SOB or lightheadedness with hypotension: Yes Has patient had a PCN reaction causing severe rash involving  mucus membranes or skin necrosis: No Has patient had a PCN reaction that required hospitalization No Has patient had a PCN reaction occurring within the last 10 years: No If all of the above answers are "NO", then may proceed with Cephalosporin use.    Review of Systems  Constitutional: Positive for activity change, appetite change and fatigue.  Respiratory: Negative for cough and shortness of breath.   Cardiovascular: Positive  for leg swelling.  Neurological: Positive for weakness.  Psychiatric/Behavioral: Negative for agitation and sleep disturbance.    Physical Exam Vitals signs and nursing note reviewed.  Constitutional:      Appearance: She is ill-appearing.     Comments: Frail, elderly, HOH  Cardiovascular:     Rate and Rhythm: Normal rate.  Pulmonary:     Effort: No tachypnea, accessory muscle usage or respiratory distress.     Breath sounds: Decreased breath sounds present.  Abdominal:     Palpations: Abdomen is soft.  Neurological:     Mental Status: She is alert.     Comments: Oriented to person and place, somewhat to situation     Vital Signs: BP (!) 113/54 (BP Location: Right Arm)   Pulse 62   Temp 98.2 F (36.8 C) (Oral)   Resp (!) 24   Ht '5\' 3"'$  (1.6 m)   Wt 69.2 kg   SpO2 95%   BMI 27.02 kg/m  Pain Scale: 0-10   Pain Score: 0-No pain   SpO2: SpO2: 95 % O2 Device:SpO2: 95 % O2 Flow Rate: .O2 Flow Rate (L/min): 3 L/min  IO: Intake/output summary:   Intake/Output Summary (Last 24 hours) at 06/23/2018 1022 Last data filed at 06/27/2018 2300 Gross per 24 hour  Intake 480 ml  Output 700 ml  Net -220 ml    LBM: Last BM Date: 06/22/18 Baseline Weight: Weight: 72.1 kg Most recent weight: Weight: 69.2 kg     Palliative Assessment/Data: 30%   Flowsheet Rows     Most Recent Value  Intake Tab  Referral Department  Hospitalist  Unit at Time of Referral  Med/Surg Unit  Palliative Care Primary Diagnosis  Pulmonary  Date Notified  06/24/18    Palliative Care Type  New Palliative care  Reason for referral  Clarify Goals of Care  Date of Admission  06/20/18  # of days IP prior to Palliative referral  4  Clinical Assessment  Psychosocial & Spiritual Assessment  Palliative Care Outcomes      Time In/Out: 9381-0175, 1025-8527 Time Total: 75 min Greater than 50%  of this time was spent counseling and coordinating care related to the above assessment and plan.  Signed by: Vinie Sill, NP Palliative Medicine Team Pager # [phone] (M-F 8a-5p) Team Phone # [phone] (Nights/Weekends)

## 2018-06-28 NOTE — Discharge Summary (Signed)
Physician Discharge Summary  Hannah Arias UXL:[id number] DOB: 06-Jun-1910 DOA: 06/20/2018  PCP: Celene Squibb, MD  Admit date: 06/20/2018 Discharge date: 07/17/2018  Time spent: 35 minutes  Recommendations for Outpatient Follow-up:  1. Repeat CBC to follow platelets count 2. Repeat basic metabolic panel to follow electrolytes and renal function 3. Close follow-up the patient's weight, I's and O's and overall volume status. 4. -Per discussion with family decision has been made for no future hospitalization and hospice will follow patient at receiving facility.   Discharge Diagnoses:  Active Problems:   Acute on chronic diastolic CHF (congestive heart failure) (HCC)   Lobar pneumonia (HCC)   Elevated troponin   Chronic respiratory failure with hypoxia (HCC)   Acute on chronic respiratory failure with hypoxia (HCC)   Thrombocytopenia (HCC)   Pulmonary edema   Discharge Condition: Stable and improved.  Discharge back to skilled nursing facility for further care and rehabilitation; hospice will follow patient at the facility.  Diet recommendation: Heart healthy diet (dysphagia 3 with thick liquids).  Filed Weights   06/25/18 0500 06/26/18 0619 07/18/2018 0500  Weight: 74 kg 69.2 kg 69.2 kg    Brief History of present illness:   85 y.o. female with medical history of diastolic CHF, GERD, and recent left hip fracture presenting with 1 to 2-day history of worsening shortness of breath.  Review the medical record shows that the patient has had some shortness of breath for approximately 1 week.  She denies any fevers, chills, chest pain but complains of a nonproductive cough and chest congestion.    Patient was recently admitted to the hospital from 04/30/2018 through 05/07/2018 secondary to a left hip fracture from mechanical fall.  She underwent ORIF of the left hip on 05/01/2018, and she was discharged to the [facility] for rehab.  Since she has been at the [facility], it is been noted  that she has had some hypoxia and worsening lower extremity edema.  Notably, the patient had her furosemide increased by her primary provider on 06/09/2018 from 20 mg daily to 40 mg daily.  During that office visit, the patient had a weight of 171 pounds.  However, it was noted on 05/28/2018 during a medical visit, the patient had a weight of 163 pounds.  Prior to her admission for her left hip fracture, the patient did not require any supplemental oxygen.  However since she has transitioned to the [facility], she has been placed on 2 L nasal cannula secondary to hypoxia and shortness of breath.   The patient does note some occasional orthopnea type symptoms with worsening lower extremity edema. In the emergency department, the patient was afebrile and hemodynamically stable saturating 94-95% on 2 L.  BMP, LFTs, CBC were essentially unremarkable.  Hemoglobin was 9.2 which is at her usual baseline.  BMP was 434.0.  Troponin was 0.15.  Chest x-ray showed right lower lobe opacity with increased interstitial markings.  The patient was started on vancomycin and cefepime.   Hospital Course:  Acute on chronic diastolic CHF -Significant improvement in patient's volume overload status -continue having excellent urine output.  -continue oral Lasix. -Renal function has remained within normal limits. -continue daily weight and acute I's and O's -[id number]--DG38-75%, +diastolic dysfx, RVSP 64.3 -low sodium diet education provided -continue weaning oxygen supplementation as tolerated.    Acute on Chronic respiratory failure with hypoxia -secondary to CHF and pneumonia -Patient has been chronically on 2 L nasal cannula at SNF. -pt required BiPAP  early am 06/21/18-->now back to HFNC 2L -continue volume control with oral diuresis. -Last antibiotic therapy date on 06/25/2018 -Patient is afebrile and denying major component of SOB.   Elevated troponin -Patient has no chest pain -trend is flat and representing  demand ischemia in the setting of heart failure exacerbation. -EKG--sinus, no STT changes   Lobar pneumonia/HCAP -06/18/2018 influenza PCR negative -continue duonebs and pulmicort -continue oral steroids tapering (last 3 days pending at discharge). -viral respiratory panel--neg -last antibiotic therapy on 06/25/2018. -Patient is afebrile with normal WBCs.   GERD -Continue PPI -denying any abd pain, nausea or vomiting.   Recent left hip fracture -05/01/2018 ORIF left hip -PT evaluation and further rehab as per the skilled nursing facility protocol. -continue Tylenol as needed for pain   Thrombocytopenia -has been chronic/intermittent -dropped most likely in the setting of acute illness -serum V74--827 -serum folic MBEM--75 -follow platelets trend. -No signs of acute overt bleeding.   Hypokalemia -Continue repletion as needed. -Patient on daily potassium supplementation -Repeat basic metabolic panel in 5 days to check electrolytes.  Procedures:  See below for x-ray reports  Consultations:  Palliative care/hospice  Discharge Exam: Vitals:   06/22/2018 0605 07/01/2018 0932  BP: (!) 113/54   Pulse: 62   Resp:    Temp: 98.2 F (36.8 C)   SpO2: 95% 95%   General exam: Afebrile; no chest pain, no nausea, no vomiting.  Patient using 2 L nasal cannula supplementation.  Significant improvement in her volume status.  Denies shortness of breath and orthopnea. Respiratory system: Normal respiratory effort, no using accessory muscles. Positive scattered rhonchi.  No frank crackles, no wheezing currently. Cardiovascular system:RRR. No murmurs, rubs, gallops. Gastrointestinal system: Abdomen is nondistended, soft and nontender. No organomegaly or masses felt. Normal bowel sounds heard. Central nervous system: Alert and oriented. No focal neurological deficits. Extremities: No cyanosis or clubbing; trace edema lower extremity bilaterally. Skin: No rashes, no petechiae; multiple bruises  from recent blood draws appreciated on upper extremities Psychiatry: Normal mood and affect.   Discharge Instructions   Discharge Instructions    (HEART FAILURE PATIENTS) Call MD:  Anytime you have any of the following symptoms: 1) 3 pound weight gain in 24 hours or 5 pounds in 1 week 2) shortness of breath, with or without a dry hacking cough 3) swelling in the hands, feet or stomach 4) if you have to sleep on extra pillows at night in order to breathe.   Complete by:  As directed    Diet - low sodium heart healthy   Complete by:  As directed    Discharge instructions   Complete by:  As directed    Follow low-sodium diet (less than 2.5 g daily) Close follow-up of patient's I's and O's and daily weights Maintain adequate hydration Repeat basic metabolic panel in 5 days to follow electrolytes and renal function Hospice to follow patient at the facility for further assessment and determination on advanced care/treatments Discussion with family members no interested in future hospitalizations or invasive treatment. Dysphasia 3 diet with thick liquids.     Allergies as of 07/19/2018      Reactions   Aspirin    Stomach problem   Codeine    hallucinate   Penicillins Rash   Has patient had a PCN reaction causing immediate rash, facial/tongue/throat swelling, SOB or lightheadedness with hypotension: Yes Has patient had a PCN reaction causing severe rash involving mucus membranes or skin necrosis: No Has patient had a PCN reaction that  required hospitalization No Has patient had a PCN reaction occurring within the last 10 years: No If all of the above answers are "NO", then may proceed with Cephalosporin use.      Medication List    STOP taking these medications   simethicone 80 MG chewable tablet Commonly known as:  MYLICON   traMADol 50 MG tablet Commonly known as:  Ultram     TAKE these medications   acetaminophen 500 MG tablet Commonly known as:  TYLENOL Take 1,000 mg by  mouth every 8 (eight) hours.   albuterol (2.5 MG/3ML) 0.083% nebulizer solution Commonly known as:  PROVENTIL Take 3 mLs (2.5 mg total) by nebulization every 4 (four) hours as needed for wheezing or shortness of breath.   budesonide 0.5 MG/2ML nebulizer solution Commonly known as:  PULMICORT Take 2 mLs (0.5 mg total) by nebulization 2 (two) times daily.   docusate sodium 100 MG capsule Commonly known as:  COLACE Take 100 mg by mouth 2 (two) times daily.   ferrous sulfate 325 (65 FE) MG EC tablet Take 325 mg by mouth daily with breakfast.   furosemide 40 MG tablet Commonly known as:  LASIX Take 1 tablet (40 mg total) by mouth 2 (two) times daily. What changed:    medication strength  when to take this   Glucerna Liqd Take 237 mLs by mouth daily.   ipratropium-albuterol 0.5-2.5 (3) MG/3ML Soln Commonly known as:  DUONEB Take 3 mLs by nebulization every 8 (eight) hours.   omeprazole 20 MG capsule Commonly known as:  PRILOSEC Take 20 mg by mouth daily.   polyethylene glycol packet Commonly known as:  MIRALAX / GLYCOLAX Take 17 g by mouth daily as needed for mild constipation or moderate constipation.   potassium chloride SA 20 MEQ tablet Commonly known as:  K-DUR,KLOR-CON Take 2 tablets (40 mEq total) by mouth daily. Start taking on:  June 29, 2018 What changed:    medication strength  how much to take   predniSONE 20 MG tablet Commonly known as:  DELTASONE Take 1 tablet (20 mg total) by mouth daily with breakfast for 3 days. Start taking on:  June 29, 2018   sennosides-docusate sodium 8.6-50 MG tablet Commonly known as:  SENOKOT-S Take 1 tablet by mouth 2 (two) times daily.      Allergies  Allergen Reactions  . Aspirin     Stomach problem  . Codeine     hallucinate  . Penicillins Rash    Has patient had a PCN reaction causing immediate rash, facial/tongue/throat swelling, SOB or lightheadedness with hypotension: Yes Has patient had a PCN reaction  causing severe rash involving mucus membranes or skin necrosis: No Has patient had a PCN reaction that required hospitalization No Has patient had a PCN reaction occurring within the last 10 years: No If all of the above answers are "NO", then may proceed with Cephalosporin use.    Contact information for after-discharge care    [facility] .   Service:  Skilled Nursing Contact information: [address] [place] [phone]              The results of significant diagnostics from this hospitalization (including imaging, microbiology, ancillary and laboratory) are listed below for reference.    Significant Diagnostic Studies: Dg Chest 2 View  Result Date: 06/20/2018 CLINICAL DATA:  Shortness of breath and productive cough for 1 week. EXAM: CHEST - 2 VIEW COMPARISON:  05/03/2018 FINDINGS: Stable mild cardiomegaly and ectasia of the thoracic aorta. Aortic atherosclerosis. New right lower lobe opacity and small right pleural effusion, suspicious for pneumonia. Left lung remains clear. IMPRESSION: New right lower lobe opacity and small right pleural effusion, suspicious for pneumonia. Recommend chest radiographic follow-up to ensure resolution. Electronically Signed   By: Earle Gell M.D.   On: 06/20/2018 10:05   Dg Chest Port 1 View  Result Date: 06/25/2018 CLINICAL DATA:  Shortness of breath, chronic kidney disease EXAM: PORTABLE CHEST 1 VIEW COMPARISON:  06/23/2010 FINDINGS: Cardiomegaly. Tortuous calcified aorta. Right upper mediastinal soft tissue prominence shown on prior CT to represent a combination of nodular goiter and prominent tortuous great vessels. Right basilar atelectasis or infiltrate with left base atelectasis. No definite visible effusions or acute bony abnormality. No significant change since prior study. IMPRESSION: No significant change since prior study. Electronically Signed   By: Rolm Baptise  M.D.   On: 06/25/2018 07:53   Dg Chest Port 1 View  Result Date: 06/23/2018 CLINICAL DATA:  Difficulty breathing EXAM: PORTABLE CHEST 1 VIEW COMPARISON:  06/21/2018 FINDINGS: Cardiac shadow is stable. Aortic calcifications and tortuosity are again seen. Stable scoliosis is noted. Increasing consolidation in the right base is noted with right-sided pleural effusion. Persistent density in the right paratracheal region is seen related to tortuous vascularity IMPRESSION: Increasing right basilar consolidation and effusion. Electronically Signed   By: Inez Catalina M.D.   On: 06/23/2018 07:39   Dg Chest Port 1 View  Result Date: 06/21/2018 CLINICAL DATA:  Labored breathing EXAM: PORTABLE CHEST 1 VIEW COMPARISON:  06/20/2018, CT 05/03/2018, radiograph 04/30/2018 FINDINGS: Bilateral pleural effusions, right greater than left with continued airspace disease at the right lung base. Stable cardiomediastinal silhouette with aortic atherosclerosis. No pneumothorax. IMPRESSION: 1. Similar appearance of right greater than left pleural effusion with continued airspace disease at the right base which may reflect atelectasis or pneumonia Electronically Signed   By: Donavan Foil M.D.   On: 06/21/2018 02:04   Dg Swallowing Func-speech Pathology  Result Date: 06/25/2018 Objective Swallowing Evaluation: Type of Study: MBS-Modified Barium Swallow Study  Patient Details Name: Hannah Arias MRN: [id number] Date of Birth: [birth year] Today's Date: 06/25/2018 Time: SLP Start Time (ACUTE ONLY): 9983 -SLP Stop Time (ACUTE ONLY): 1329 SLP Time Calculation (min) (ACUTE ONLY): 23 min Past Medical History: Past Medical History: Diagnosis Date . Chronic anemia 05/01/2018 . Chronic diastolic heart failure ([place]) 06/12/2018 . CKD (chronic kidney disease), stage III ([place]) 05/01/2018 . Closed fracture of olecranon process of ulna 11/07/2009  Qualifier: Diagnosis of  By: Danne Harbor, Chasity L  . Constipation 05/01/2018 . CTS (carpal tunnel syndrome)  03/04/2011 . GERD (gastroesophageal reflux disease)  . RUPTURE ROTATOR CUFF 03/19/2010  Qualifier: Diagnosis of  By: Aline Brochure MD, Dorothyann Peng   Past Surgical History: Past Surgical History: Procedure Laterality Date . APPENDECTOMY    85 yr old . BREAST LUMPECTOMY   . CATARACT EXTRACTION   . ELBOW SURGERY   . HEMORROIDECTOMY   . HERNIA REPAIR   . INTRAMEDULLARY (IM) NAIL INTERTROCHANTERIC Left 05/01/2018  Procedure: LEFT BIOMET AFFIXUS (IM) SHORT NAIL, INTERTROCHANTRIC FRACTURE;  Surgeon: [name], MD;  Location: [facility];  Service: Orthopedics;  Laterality: Left; . SHOULDER SURGERY    left rotator cuff . TONSILLECTOMY AND ADENOIDECTOMY    9 yr . tubal removal   HPI: 85 y.o. female with medical history of diastolic CHF, GERD, and recent left hip fracture presenting with 1 to 2-day history of  worsening shortness of breath.  Review the medical record shows that the patient has had some shortness of breath for approximately 1 week.  She denies any fevers, chills, chest pain but complains of a nonproductive cough and chest congestion.    Patient was recently admitted to the hospital from 04/30/2018 through 05/07/2018 secondary to a left hip fracture from mechanical fall.  She underwent ORIF of the left hip on 05/01/2018, and she was discharged to the [facility] for rehab.  Since she has been at the [facility], it is been noted that she has had some hypoxia and worsening lower extremity edema.  Notably, the patient had her furosemide increased by her primary provider on 06/09/2018 from 20 mg daily to 40 mg daily.  During that office visit, the patient had a weight of 171 pounds.  However, it was noted on 05/28/2018 during a medical visit, the patient had a weight of 163 pounds.  Prior to her admission for her left hip fracture, the patient did not require any supplemental oxygen.  However since she has transitioned to the [facility], she has been placed on 2 L nasal cannula secondary to hypoxia and shortness of breath.   The patient does note some occasional orthopnea type symptoms with worsening lower extremity edema. BSE determined need for instrumental testing.  Subjective: "Today is the first day I have felt like eating anything. I want some bread." Assessment / Plan / Recommendation CHL IP CLINICAL IMPRESSIONS 06/25/2018 Clinical Impression Pt presents with mild/moderate sensorimotor oropharyngeal dysphagia characterized by consistent trace to minimal amount of penetration of thin liquids during the swallow to the level of the cords with subsequent silent aspiration after the swallow secondary to decreased Laryngeal vestibule closure (LVC). Oral phase is characterized by decreased bolus cohesion and premature spillage to the level of the pyriforms; mild to mod residue of thin liquids pooling in the valleculae and pyriforms after the swallow of which she is not sensate to. Consistent pharyngeal residue of all consistencies noted after the swallow secondary to decreased strength and duration of pharyngeal squeeze during the swallow. Volume dependent improved LVC manifested by no penetration or aspiration visualized with NTL or puree trials. Difficulty with oral AP tranist of pill requiring multiple trials and consistencies to transfer; Pill with stasis in the valleculae requiring again multiple trials of NTL and puree to pass through pharyngeal space. Esophageal sweep reveals stasis of the pill in the upper esophagus with what appeared to be reduced esophageal paristalsis and despite multiple trials and additional administrations was not visualized passing through the distal esophagus. No Radiologist present to confirm. Recommend continue D3/mechanical soft diet with NTL  with precautions re: small sips/bites, alternate bites and sips, take additional dry swallows throughout meal, always be seated upright for all PO. Recommend meds be administered crushed in puree. ST will continue to follow for diet tolerance and family/patient  education.  SLP Visit Diagnosis Dysphagia, oropharyngeal phase (R13.12) Attention and concentration deficit following -- Frontal lobe and executive function deficit following -- Impact on safety and function Mild aspiration risk;Moderate aspiration risk   CHL IP TREATMENT RECOMMENDATION 06/25/2018 Treatment Recommendations Therapy as outlined in treatment plan below   Prognosis 06/25/2018 Prognosis for Safe Diet Advancement Good Barriers to Reach Goals -- Barriers/Prognosis Comment -- CHL IP DIET RECOMMENDATION 06/25/2018 SLP Diet Recommendations Dysphagia 3 (Mech soft) solids;Nectar thick liquid Liquid Administration via -- Medication Administration Crushed with puree Compensations Slow rate;Small sips/bites;Lingual sweep for clearance of pocketing Postural Changes --  CHL IP OTHER RECOMMENDATIONS 06/25/2018 Recommended Consults -- Oral Care Recommendations Oral care BID Other Recommendations Order thickener from pharmacy   CHL IP FOLLOW UP RECOMMENDATIONS 06/25/2018 Follow up Recommendations 24 hour supervision/assistance;Skilled Nursing facility   [facility] IP FREQUENCY AND DURATION 06/25/2018 Speech Therapy Frequency (ACUTE ONLY) min 2x/week Treatment Duration 1 week      CHL IP ORAL PHASE 06/25/2018 Oral Phase Impaired Oral - Pudding Teaspoon -- Oral - Pudding Cup -- Oral - Honey Teaspoon -- Oral - Honey Cup -- Oral - Nectar Teaspoon -- Oral - Nectar Cup Premature spillage;Decreased bolus cohesion Oral - Nectar Straw -- Oral - Thin Teaspoon -- Oral - Thin Cup Premature spillage;Decreased bolus cohesion;Piecemeal swallowing;Right anterior bolus loss Oral - Thin Straw Premature spillage;Delayed oral transit;Decreased bolus cohesion;Right anterior bolus loss Oral - Puree Premature spillage;Holding of bolus;Reduced posterior propulsion Oral - Mech Soft -- Oral - Regular -- Oral - Multi-Consistency -- Oral - Pill Premature spillage;Holding of bolus;Reduced posterior propulsion;Lingual pumping Oral Phase - Comment --  CHL IP PHARYNGEAL  PHASE 06/25/2018 Pharyngeal Phase -- Pharyngeal- Pudding Teaspoon -- Pharyngeal -- Pharyngeal- Pudding Cup -- Pharyngeal -- Pharyngeal- Honey Teaspoon -- Pharyngeal -- Pharyngeal- Honey Cup -- Pharyngeal -- Pharyngeal- Nectar Teaspoon -- Pharyngeal -- Pharyngeal- Nectar Cup -- Pharyngeal -- Pharyngeal- Nectar Straw -- Pharyngeal -- Pharyngeal- Thin Teaspoon -- Pharyngeal -- Pharyngeal- Thin Cup -- Pharyngeal Material enters airway, passes BELOW cords without attempt by patient to eject out (silent aspiration) Pharyngeal- Thin Straw -- Pharyngeal Material enters airway, passes BELOW cords without attempt by patient to eject out (silent aspiration) Pharyngeal- Puree -- Pharyngeal -- Pharyngeal- Mechanical Soft -- Pharyngeal -- Pharyngeal- Regular -- Pharyngeal -- Pharyngeal- Multi-consistency -- Pharyngeal -- Pharyngeal- Pill -- Pharyngeal -- Pharyngeal Comment --  CHL IP CERVICAL ESOPHAGEAL PHASE 06/25/2018 Cervical Esophageal Phase Impaired Pudding Teaspoon -- Pudding Cup -- Honey Teaspoon -- Honey Cup -- Nectar Teaspoon -- Nectar Cup -- Nectar Straw -- Thin Teaspoon -- Thin Cup -- Thin Straw -- Puree -- Mechanical Soft -- Regular -- Multi-consistency -- Pill -- Cervical Esophageal Comment -- Amelia H. Roddie Mc, CCC-SLP Speech Language Pathologist Wende Bushy 06/25/2018, 3:30 PM              Xr Hip Unilat W Or W/o Pelvis 2-3 Views Left  Result Date: 06/17/2018 AP lateral left hip obtained and reviewed this shows trochanteric nail with interval healing.  No evidence of loosening or subsidence. Impression: Satisfactory postop ORIF left hip fracture with truck nail.   Microbiology: Recent Results (from the past 240 hour(s))  MRSA PCR Screening     Status: None   Collection Time: 06/20/18 11:40 AM  Result Value Ref Range Status   MRSA by PCR NEGATIVE NEGATIVE Final    Comment:        The GeneXpert MRSA Assay (FDA approved for NASAL specimens only), is one component of a comprehensive MRSA  colonization surveillance program. It is not intended to diagnose MRSA infection nor to guide or monitor treatment for MRSA infections. Performed at [facility], [address], [place], [place] [address]   Respiratory Panel by PCR     Status: None   Collection Time: 06/21/18 10:15 AM  Result Value Ref Range Status   Adenovirus NOT DETECTED NOT DETECTED Final   Coronavirus 229E NOT DETECTED NOT DETECTED Final    Comment: (NOTE) The Coronavirus on the Respiratory Panel, DOES NOT test for the novel  Coronavirus (2019 nCoV)    Coronavirus HKU1 NOT DETECTED NOT  DETECTED Final   Coronavirus NL63 NOT DETECTED NOT DETECTED Final   Coronavirus OC43 NOT DETECTED NOT DETECTED Final   Metapneumovirus NOT DETECTED NOT DETECTED Final   Rhinovirus / Enterovirus NOT DETECTED NOT DETECTED Final   Influenza A NOT DETECTED NOT DETECTED Final   Influenza B NOT DETECTED NOT DETECTED Final   Parainfluenza Virus 1 NOT DETECTED NOT DETECTED Final   Parainfluenza Virus 2 NOT DETECTED NOT DETECTED Final   Parainfluenza Virus 3 NOT DETECTED NOT DETECTED Final   Parainfluenza Virus 4 NOT DETECTED NOT DETECTED Final   Respiratory Syncytial Virus NOT DETECTED NOT DETECTED Final   Bordetella pertussis NOT DETECTED NOT DETECTED Final   Chlamydophila pneumoniae NOT DETECTED NOT DETECTED Final   Mycoplasma pneumoniae NOT DETECTED NOT DETECTED Final    Comment: Performed at [facility], [place] [address], [place], [place] [address]     Labs: Basic Metabolic Panel: Recent Labs  Lab 06/22/18 0531 06/23/18 0538 06/25/18 0520 06/26/18 0705 06/27/18 0706 07/09/2018 0537  NA 145 143 142 140 141 139  K 3.9 4.4 3.9 3.4* 3.5 3.2*  CL 98 97* 93* 86* 86* 89*  CO2 38* 39* 41* 43* 46* 42*  GLUCOSE 172* 172* 150* 105* 98 104*  BUN 30* 41* 46* 46* 45* 41*  CREATININE 0.85 0.82 0.83 0.75 0.73 0.69  CALCIUM 8.6* 8.4* 8.9 9.1 8.7* 8.5*  MG 1.8  --   --   --   --   --    CBC: Recent Labs  Lab 06/22/18 0531   WBC 3.0*  NEUTROABS 2.2  HGB 9.4*  HCT 31.2*  MCV 103.7*  PLT 154   BNP (last 3 results) Recent Labs    05/03/18 0219 06/20/18 0855  BNP 118.9* 434.0*    Signed:  Barton Dubois MD.  [facility] 07/16/2018, 2:38 PM

## 2018-06-28 NOTE — Care Management (Signed)
SS #: [id number]  Patient Information   Patient Name Braniya, Farrugia ([id number]) Sex Female DOB 1910-08-03  Room Bed  A329 A329-01  Patient Demographics   Address [place] [address] Phone [phone] (Home) E-mail Address [email or web address]  Patient Ethnicity & Race   Ethnic Group Patient Race  Not Hispanic or Latino White or Caucasian  Emergency Contact(s)   Name Relation Home Work [place] Son [phone]  [phone]  Abra, [place]   [phone]  Shanetra, [place] [phone]    [facility] Relative [place], [place]   [phone]  Documents on File    Status Date Received Description  Documents for the Patient  EMR Problem Summary Not Received    EMR Patient Summary Not Received    [place] Received 06/20/10   [place] E-Signature HIPAA Notice of Privacy Received 06/17/10   [place] E-Signature HIPAA Notice of Privacy [place] Not Received    Driver's License Not Received    Advance Directives/Living Will/HCPOA/POA Received 05/19/18 DNR order  Release of Information Not Received    [place] HIPAA NOTICE OF PRIVACY - Scanned Not Received    Historic Radiology Documentation Not Received    EMR Medication Summary Not Received    Insurance Card Received 08/14/10   Historic Radiology Documentation Not Received    Financial Application Not Received    Insurance Card Received 03/04/11   AMB Intake Forms/Questionnaires Not Received    AMB Correspondence Not Received  01/10 H\T\P Ruthe Mannan   Insurance Card Received 12/10/12   Advanced Beneficiary Notice (ABN) Not Received    [place] E-Signature HIPAA Notice of Privacy Received 02/22/13 TFC-CG  E-Signature AOB [place] Not Received    Other Photo ID Not Received    Insurance Card Received 08/01/16 TFC-Aetna 2018  HIM ROI Authorization (Expired) 02/13/17 Authorization for batch [employer]/AETNA  Medicare Risk Adjustment Review   fbg   02/13/17  Insurance Card Received 03/31/18 Aetna Medicare(no Medicaid card w/pt)ROSM  Release of Information Received 04/07/18 [facility]  Insurance Card Received 05/06/18 new medicaid  Advance Directives/Living Will/HCPOA/POA Received 05/22/18   HIM ROI Authorization  06/22/18 pt acct  HIM Release of Information Output  06/22/18 Requested records  Documents for the Encounter  AOB (Assignment of Insurance Benefits) Not Received    E-signature AOB Signed 06/20/18   MEDICARE RIGHTS Not Received    E-signature Medicare Rights Signed 06/20/18   ED Patient Billing Extract   ED PB Billing Extract  Cardiac Monitoring Strip Shift Summary Received 06/21/18   Cardiac Monitoring Strip Received 06/25/18   EKG Received 06/21/18   Study Attachment for Report   External Report  Admission Information   Current Information   Attending Provider Admitting Provider Admission Type Admission Status  Barton Dubois, MD Orson Jacalynn, MD Emergency Admission (Confirmed)       Admission Date/Time Discharge Date Hospital Service Auth/Cert Status  89/21/19 08:27 AM  Internal Medicine [place] [facility] Room/Bed   [facility] AP-DEPT 300 A329/A329-01        Admission   Complaint  [facility]  Hospital Account   Name Acct ID Class Status Primary Coverage  Talise, [place] [id number] Inpatient Open [place]      Guarantor Account (for [place] [id number])   Name Relation to Pt Service Area Active? Acct Type  Taleshia, Luff Self CHSA Yes  Personal/Family  Address Phone    [address] [place], [place] [address] [phone])        Coverage Information (for Hospital Account [id number])   1. AETNA MEDICARE/AETNA MEDICARE HMO/PPO   F/O Payor/Plan Precert #  AETNA MEDICARE/AETNA MEDICARE HMO/PPO   Subscriber Subscriber #  Zyonna, [place] [facility]  [place]  [address] [place] [address] [phone]  2. MEDICAID [place]/MEDICAID OF [place]   F/O Payor/Plan Precert #  MEDICAID [employer]/MEDICAID OF [place]   Subscriber Subscriber #  Mariaguadalupe, [place] [id number]  Address Phone  [address] [address] [place], [place] [address] [phone]       Care Everywhere ID:  [id number]

## 2018-06-28 NOTE — Care Management (Signed)
Plan for return to [facility] today under [place] and with referral for Hospice. Pt's family has already discussed referral for hospice being sent to [facility]. CM will provide hospice with updated info. CM has discussed DC plan with Marianna Fuss, [facility] rep, and son Florabel Faulks.

## 2018-06-28 NOTE — NC FL2 (Addendum)
[place] LEVEL OF CARE SCREENING TOOL     IDENTIFICATION  Patient Name: Hannah Arias Birthdate: Sep 15, 1910 Sex: female Admission Date (Current Location): 06/20/2018  [facility] and [place] Number:  [employer] and Address:  [place] [address] [place]      Provider Number: [id number]  Attending Physician Name and Address:  Barton Dubois, MD  Relative Name and Phone Number:  Jolicia Delira [phone]    Current Level of Care: Hospital Recommended Level of Care: Nursing Facility Prior Approval Number: [id number]  Date Approved/Denied:   PASRR Number:    Discharge Plan: SNF    Current Diagnoses: Patient Active Problem List   Diagnosis Date Noted  . Acute on chronic respiratory failure with hypoxia ([place]) 06/21/2018  . Thrombocytopenia ([place]) 06/21/2018  . Acute on chronic diastolic CHF (congestive heart failure) ([place]) 06/20/2018  . Lobar pneumonia ([place]) 06/20/2018  . Elevated troponin 06/20/2018  . Chronic respiratory failure with hypoxia ([place]) 06/20/2018  . Chronic pain of multiple joints 06/20/2018  . HCAP (healthcare-associated pneumonia)   . Chronic diastolic heart failure ([place]) 06/12/2018  . Hypertension 05/10/2018  . Acute blood loss anemia 05/03/2018  . AKI (acute kidney injury) ([place]) 05/01/2018  . CKD (chronic kidney disease), stage III ([place]) 05/01/2018  . Abdominal hernia 05/01/2018  . Constipation 05/01/2018  . Chronic anemia 05/01/2018  . Hip fracture ([place]) 04/30/2018  . Hypoxia 05/11/2013    Orientation RESPIRATION BLADDER Height & Weight     Self, Place  O2(3lpm) Incontinent Weight: 69.2 kg Height:  5\' 3"  (160 cm)  BEHAVIORAL SYMPTOMS/MOOD NEUROLOGICAL BOWEL NUTRITION STATUS      Incontinent Diet(regurlar)  AMBULATORY STATUS COMMUNICATION OF NEEDS Skin   Extensive Assist Verbally Normal                       Personal Care Assistance Level of Assistance  Bathing, Feeding, Dressing  Bathing Assistance: Limited assistance Feeding assistance: Limited assistance Dressing Assistance: Limited assistance     Functional Limitations Info  Sight, Hearing, Speech Sight Info: Adequate Hearing Info: Impaired(very HOH) Speech Info: Adequate    SPECIAL CARE FACTORS FREQUENCY                       Contractures Contractures Info: Not present    Additional Factors Info  Code Status, Allergies Code Status Info: DNR, MOST form Allergies Info: Aspirin, codeine, penicillins           Current Medications (06/23/2018):  This is the current hospital active medication list Current Facility-Administered Medications  Medication Dose Route Frequency Provider Last Rate Last Dose  . 0.9 %  sodium chloride infusion  250 mL Intravenous PRN Tat, David, MD      . acetaminophen (TYLENOL) tablet 650 mg  650 mg Oral Q4H PRN Orson Marvel, MD   650 mg at 06/20/18 1633  . budesonide (PULMICORT) nebulizer solution 0.5 mg  0.5 mg Nebulization BID Tat, David, MD   0.5 mg at 06/22/2018 0932  . docusate sodium (COLACE) capsule 100 mg  100 mg Oral BID Tat, Shanon Brow, MD   100 mg at 07/11/2018 1015  . enoxaparin (LOVENOX) injection 40 mg  40 mg Subcutaneous Q24H Barton Dubois, MD   40 mg at 06/27/18 1400  . ferrous sulfate tablet 325 mg  325 mg Oral Q breakfast Tat, Shanon Brow, MD   325 mg at 07/15/2018 0807  . furosemide (LASIX) tablet 40 mg  40 mg Oral BID Barton Dubois, MD   40 mg at 07/15/2018 1740  . GLUCERNA liquid 237 mL  237 mL Oral Daily Tat, David, MD   237 mL at 07/05/2018 1018  . guaiFENesin (ROBITUSSIN) 100 MG/5ML solution 400 mg  20 mL Oral TID Orson Kayelyn, MD   400 mg at 07/13/2018 1014  . ipratropium-albuterol (DUONEB) 0.5-2.5 (3) MG/3ML nebulizer solution 3 mL  3 mL Nebulization Q6H WA Barton Dubois, MD   3 mL at 07/19/2018 0936  . LORazepam (ATIVAN) injection 0.5 mg  0.5 mg Intravenous BID PRN Tat, Shanon Brow, MD      . MEDLINE mouth rinse  15 mL Mouth Rinse BID Tat, David, MD   15 mL at 06/22/2018 1028  .  ondansetron (ZOFRAN) injection 4 mg  4 mg Intravenous Q6H PRN Orson Latresha, MD   4 mg at 06/22/18 1810  . pantoprazole (PROTONIX) EC tablet 40 mg  40 mg Oral Daily Tat, David, MD   40 mg at 07/20/2018 1015  . polyethylene glycol (MIRALAX / GLYCOLAX) packet 17 g  17 g Oral Daily PRN Tat, David, MD      . potassium chloride SA (K-DUR,KLOR-CON) CR tablet 40 mEq  40 mEq Oral Daily Barton Dubois, MD   40 mEq at 06/25/2018 1015  . [START ON 06/29/2018] predniSONE (DELTASONE) tablet 20 mg  20 mg Oral Q breakfast Barton Dubois, MD      . Resource ThickenUp Clear   Oral PRN Barton Dubois, MD      . simethicone [facility]) chewable tablet 80 mg  80 mg Oral Q4H PRN Tat, David, MD      . sodium chloride flush (NS) 0.9 % injection 3 mL  3 mL Intravenous Therisa Doyne, MD   3 mL at 07/03/2018 1022  . sodium chloride flush (NS) 0.9 % injection 3 mL  3 mL Intravenous PRN Tat, David, MD      . traMADol Veatrice [facility]) tablet 50 mg  50 mg Oral Q6H PRN Orson Mayu, MD         Discharge Medications: Please see discharge summary for a list of discharge medications.  Relevant Imaging Results:  Relevant Lab Results:   Additional Information Hospice  Roda Shutters, Margretta Sidle, RN

## 2018-06-28 NOTE — Care Management Important Message (Signed)
Important Message  Patient Details  Name: Hannah Arias MRN: [id number] Date of Birth: November 30, 1910   Medicare Important Message Given:  Yes    Tommy Medal 06/23/2018, 1:48 PM

## 2018-06-28 NOTE — Progress Notes (Signed)
IV removed, patient tolerated well. Report called to Inez Catalina, nurse at [facility] and all questions answered.  Patient's son aware of patient being discharged back to [facility].

## 2018-06-29 ENCOUNTER — Encounter: Payer: Self-pay | Admitting: Adult Health

## 2018-06-29 ENCOUNTER — Non-Acute Institutional Stay (SKILLED_NURSING_FACILITY): Payer: Medicare HMO | Admitting: Adult Health

## 2018-06-29 DIAGNOSIS — S72002S Fracture of unspecified part of neck of left femur, sequela: Secondary | ICD-10-CM

## 2018-06-29 DIAGNOSIS — Z7189 Other specified counseling: Secondary | ICD-10-CM

## 2018-06-29 DIAGNOSIS — D649 Anemia, unspecified: Secondary | ICD-10-CM

## 2018-06-29 DIAGNOSIS — G8929 Other chronic pain: Secondary | ICD-10-CM | POA: Diagnosis not present

## 2018-06-29 DIAGNOSIS — E43 Unspecified severe protein-calorie malnutrition: Secondary | ICD-10-CM | POA: Diagnosis not present

## 2018-06-29 DIAGNOSIS — J9611 Chronic respiratory failure with hypoxia: Secondary | ICD-10-CM | POA: Diagnosis not present

## 2018-06-29 DIAGNOSIS — K219 Gastro-esophageal reflux disease without esophagitis: Secondary | ICD-10-CM

## 2018-06-29 DIAGNOSIS — R1312 Dysphagia, oropharyngeal phase: Secondary | ICD-10-CM

## 2018-06-29 DIAGNOSIS — K5909 Other constipation: Secondary | ICD-10-CM | POA: Diagnosis not present

## 2018-06-29 DIAGNOSIS — M2550 Pain in unspecified joint: Secondary | ICD-10-CM | POA: Diagnosis not present

## 2018-06-29 DIAGNOSIS — Z515 Encounter for palliative care: Secondary | ICD-10-CM

## 2018-06-29 DIAGNOSIS — Z789 Other specified health status: Secondary | ICD-10-CM

## 2018-06-29 NOTE — Progress Notes (Signed)
Location:   [facility] Room Number: 102 P Place of Service:  SNF (31)   CODE STATUS: DNR  Allergies  Allergen Reactions  . Aspirin     Stomach problem  . Codeine     hallucinate  . Penicillins Rash    Has patient had a PCN reaction causing immediate rash, facial/tongue/throat swelling, SOB or lightheadedness with hypotension: Yes Has patient had a PCN reaction causing severe rash involving mucus membranes or skin necrosis: No Has patient had a PCN reaction that required hospitalization No Has patient had a PCN reaction occurring within the last 10 years: No If all of the above answers are "NO", then may proceed with Cephalosporin use.     Chief Complaint  Patient presents with  . Hospitalization Follow-up    Hospital follow up    HPI:  She is a  85 year old long term resident of this facility who has been hospitalized due to acute on chronic diastolic CHF acute on chronic respiratory failure; and HCAP. She was treated with abt and completed those on 06-25-18. She denies any cough; no shortness of breath; denies any worsening appetite; no anxiety. She does not like thickened liquids. She will continue to be followed for her chronic illnesses including: chronic respiratory failure; chronic pain; and anemia.   Past Medical History:  Diagnosis Date  . Chronic anemia 05/01/2018  . Chronic diastolic heart failure ([place]) 06/12/2018  . CKD (chronic kidney disease), stage III ([place]) 05/01/2018  . Closed fracture of olecranon process of ulna 11/07/2009   Qualifier: Diagnosis of  By: Danne [place], Chasity L   . Constipation 05/01/2018  . CTS (carpal tunnel syndrome) 03/04/2011  . GERD (gastroesophageal reflux disease)   . RUPTURE ROTATOR CUFF 03/19/2010   Qualifier: Diagnosis of  By: Aline Brochure MD, Dorothyann Peng      Past Surgical History:  Procedure Laterality Date  . APPENDECTOMY     85 yr old  . BREAST LUMPECTOMY    . CATARACT EXTRACTION    . ELBOW SURGERY      . HEMORROIDECTOMY    . HERNIA REPAIR    . INTRAMEDULLARY (IM) NAIL INTERTROCHANTERIC Left 05/01/2018   Procedure: LEFT BIOMET AFFIXUS (IM) SHORT NAIL, INTERTROCHANTRIC FRACTURE;  Surgeon: [name], MD;  Location: [facility];  Service: Orthopedics;  Laterality: Left;  . SHOULDER SURGERY     left rotator cuff  . TONSILLECTOMY AND ADENOIDECTOMY     11 yr  . tubal removal      Social History   Socioeconomic History  . Marital status: Widowed    Spouse name: Not on file  . Number of children: Not on file  . Years of education: Not on file  . Highest education level: Not on file  Occupational History  . Not on file  Social Needs  . Financial resource strain: Not on file  . Food insecurity:    Worry: Not on file    Inability: Not on file  . Transportation needs:    Medical: Not on file    Non-medical: Not on file  Tobacco Use  . Smoking status: Never Smoker  . Smokeless tobacco: Never Used  Substance and Sexual Activity  . Alcohol use: No  . Drug use: Not on file  . Sexual activity: Not on file  Lifestyle  . Physical activity:    Days per week: Not on file    Minutes per session: Not on file  . Stress: Not on file  Relationships  . Social connections:    Talks on phone: Not on file    Gets together: Not on file    Attends religious service: Not on file    Active member of club or organization: Not on file    Attends meetings of clubs or organizations: Not on file    Relationship status: Not on file  . Intimate partner violence:    Fear of current or ex partner: Not on file    Emotionally abused: Not on file    Physically abused: Not on file    Forced sexual activity: Not on file  Other Topics Concern  . Not on file  Social History Narrative  . Not on file   Family History  Problem Relation Age of Onset  . Cancer Other   . Diabetes Other       VITAL SIGNS Ht 5\' 3"  (1.6 m)   Wt 152 lb 9 oz (69.2 kg)   BMI 27.03 kg/m   Outpatient Encounter Medications as  of 06/29/2018  Medication Sig  . acetaminophen (TYLENOL) 500 MG tablet Take 1,000 mg by mouth every 8 (eight) hours.   Marland Kitchen albuterol (PROVENTIL) (2.5 MG/3ML) 0.083% nebulizer solution Take 3 mLs (2.5 mg total) by nebulization every 4 (four) hours as needed for wheezing or shortness of breath.  . budesonide (PULMICORT) 0.5 MG/2ML nebulizer solution Take 2 mLs (0.5 mg total) by nebulization 2 (two) times daily.  Marland Kitchen docusate sodium (COLACE) 100 MG capsule Take 100 mg by mouth 2 (two) times daily.  . ferrous sulfate 325 (65 FE) MG EC tablet Take 325 mg by mouth daily with breakfast.  . furosemide (LASIX) 40 MG tablet Take 1 tablet (40 mg total) by mouth 2 (two) times daily.  . Glucerna (GLUCERNA) LIQD Take 237 mLs by mouth daily.  Marland Kitchen ipratropium-albuterol (DUONEB) 0.5-2.5 (3) MG/3ML SOLN Take 3 mLs by nebulization every 8 (eight) hours.  . NON FORMULARY Diet Type:  D3 with nectar thickened liquids  . omeprazole (PRILOSEC) 20 MG capsule Take 20 mg by mouth daily.   . OXYGEN Inhale 2 L/min into the lungs continuous.  . polyethylene glycol (MIRALAX / GLYCOLAX) packet Take 17 g by mouth daily as needed for mild constipation or moderate constipation.  . potassium chloride SA (K-DUR,KLOR-CON) 20 MEQ tablet Take 2 tablets (40 mEq total) by mouth daily.  . predniSONE (DELTASONE) 20 MG tablet Take 1 tablet (20 mg total) by mouth daily with breakfast for 3 days.  . sennosides-docusate sodium (SENOKOT-S) 8.6-50 MG tablet Take 1 tablet by mouth 2 (two) times daily.   No facility-administered encounter medications on file as of 06/29/2018.      SIGNIFICANT DIAGNOSTIC EXAMS  REVIEWED TODAY:   06-20-18: 2-d echo:  1. The left ventricle has normal systolic function with an ejection fraction of 60-65%. The cavity size was normal. There is mildly increased left ventricular wall thickness. Left ventricular diastolic Doppler parameters are consistent with impaired  relaxation.  2. The right ventricle has normal systolic  function. The cavity was normal. There is no increase in right ventricular wall thickness. Right ventricular systolic pressure is mildly elevated with an estimated pressure of 39.7 mmHg.  3. The mitral valve is normal in structure. Mild thickening of the mitral valve leaflet. There is mild mitral annular calcification present.  4. The tricuspid valve is normal in structure.  5. The aortic valve is tricuspid Mild thickening of the aortic valve.  6. The pulmonic valve was normal in structure.  7. Normal LV systolic function; mild diastolic dysfunction; mild LVH; mild TR; mild pulmonary hypertension.  06-20-18: chest x-ray: New right lower lobe opacity and small right pleural effusion, suspicious for pneumonia. Recommend chest radiographic follow-up to ensure resolution.  06-23-18: chest x-ray: Increasing right basilar consolidation and effusion.    LABS REVIEWED PREVIOUS:   04-30-18: wbc 6.6; hgb 10.5; hct 33.1; mcv 102.2; plt 150; glucose 163; bun 22; creat 1.20; k+ 4.5; an++ 140; ca 8.8; liver normal albumin 3.4  05-31-18: wbc 4.8; hgb 8.9; hct 30.3; mcv 104.5 plt 159; glucose 90; bun 26; creat 0.62; k+ 4.2; na++ 139; ca 8.3   TODAY;   06-20-18: wbc 7.1; hgb 9.2; hct 31.3; mcv 104.3; plt  163 glucose 125; bun 24; creat 0.58; k+ 4.0; na++ 138; ca 8.6 liver normal albumin 2.7 BNP 434.0 06-22-18: wbc 3.0; hgb 9.4; hct 31.2; mcv 103.7 plt 154 glucose 172; bun 30; creat 0.85; k+ 3.9; na++ 145 ca 8.6 mag 1.8; folate  14.0; vit B 12: 449  06/27/2018: glucose 104; bun 41; creat 0.69; k+ 3.2; na++ 139; ca 8.5    Review of Systems  Constitutional: Negative for malaise/fatigue.  Respiratory: Negative for cough and shortness of breath.   Cardiovascular: Negative for chest pain, palpitations and leg swelling.  Gastrointestinal: Negative for abdominal pain, constipation and heartburn.  Musculoskeletal: Positive for joint pain. Negative for back pain and myalgias.       All joints hurt   Skin: Negative.     Neurological: Negative for dizziness.  Psychiatric/Behavioral: The patient is not nervous/anxious.     Physical Exam Constitutional:      General: She is not in acute distress.    Appearance: She is well-developed. She is not diaphoretic.  Neck:     Musculoskeletal: Neck supple.     Thyroid: No thyromegaly.  Cardiovascular:     Rate and Rhythm: Normal rate and regular rhythm.     Pulses: Normal pulses.     Heart sounds: Normal heart sounds.  Pulmonary:     Effort: Pulmonary effort is normal. No respiratory distress.     Breath sounds: Normal breath sounds.     Comments: 02 dependent  Abdominal:     General: Bowel sounds are normal. There is no distension.     Palpations: Abdomen is soft.     Tenderness: There is no abdominal tenderness.  Musculoskeletal:     Right lower leg: Edema present.     Left lower leg: Edema present.     Comments:  able to move all extremities 2+ bilateral lower extremity edema Is status post left hip fracture     Lymphadenopathy:     Cervical: No cervical adenopathy.  Skin:    General: Skin is warm and dry.  Neurological:     Mental Status: She is alert. Mental status is at baseline.  Psychiatric:        Mood and Affect: Mood normal.     ASSESSMENT/ PLAN:  TODAY;   1. Acute on chronic diastolic CHF (congestive heart failure): EF 60-65% (06-20-18): is stable will continue lasix 40 mg twice daily with k + 40 meq twice daily   2. Chronic respiratory failure with hypoxia: is stable is 02 dependent; will continue pulmicort 0.5 mg neb twice daily duoneb every 8 hours and has albuterol neb every 4 hour as needed  3. Closed left hip fracture, sequela/has chronic pain of multiple joints: is stable will continue tylenol 1 gm three times daily  4. Chronic anemia: is stable hgb 9.4; will continue iron daily will monitor  5. Chronic constipation: is stable will continue colace twice daily    6. GERD without esophagitis: is stable will continue  prilosec 20 mg daily   7. Dysphagia oropharyngeal: is stable; no further signs of aspiration present: will continue nectar thick liquids; she does not like them.   8. Protein calorie malnutrition, severe: is without change albumin 2.7 will continue glucerna daily   9. HCAP: is stable has completed abt will complete prednisone and will monitor her status.   Will check cbc cmp 07-05-18      MD is aware of resident's narcotic use and is in agreement with current plan of care. We will attempt to wean resident as apropriate   Ok Edwards NP [facility]  Contact [phone] Monday through Friday 8am- 5pm  After hours call [phone]

## 2018-07-01 ENCOUNTER — Encounter: Payer: Self-pay | Admitting: Internal Medicine

## 2018-07-01 ENCOUNTER — Non-Acute Institutional Stay (SKILLED_NURSING_FACILITY): Payer: Medicare HMO | Admitting: Internal Medicine

## 2018-07-01 DIAGNOSIS — J181 Lobar pneumonia, unspecified organism: Secondary | ICD-10-CM

## 2018-07-01 DIAGNOSIS — I5033 Acute on chronic diastolic (congestive) heart failure: Secondary | ICD-10-CM

## 2018-07-01 DIAGNOSIS — N183 Chronic kidney disease, stage 3 unspecified: Secondary | ICD-10-CM

## 2018-07-01 DIAGNOSIS — S72002S Fracture of unspecified part of neck of left femur, sequela: Secondary | ICD-10-CM

## 2018-07-01 DIAGNOSIS — R0902 Hypoxemia: Secondary | ICD-10-CM | POA: Diagnosis not present

## 2018-07-01 MED ORDER — HYDROCODONE-ACETAMINOPHEN 5-325 MG PO TABS: 1.0000 | ORAL_TABLET | ORAL | 0 refills | Status: DC | PRN

## 2018-07-01 NOTE — Progress Notes (Signed)
Entered in error

## 2018-07-01 NOTE — Progress Notes (Signed)
Provider:  Veleta Miners, MD Location:  [facility] Room Number: 103 P Place of Service:  SNF [phone]  PCP: Celene Squibb, MD Patient Care Team: Celene Squibb, MD as PCP - General (Internal Medicine)  Extended Emergency Contact Information Primary Emergency Contact: Venters,EllEs Address: [address]          [place], [place] [address] [place] Phone: [phone] Mobile Phone: [phone] Relation: Son Secondary Emergency Contact: Haniyyah, Sakuma Mobile Phone: [phone] Relation: Grandson  Code Status: DNR Goals of Care: Advanced Directive information Advanced Directives 07/01/2018  Does Patient Have a Medical Advance Directive? Yes  Type of Advance Directive Out of facility DNR (pink MOST or yellow form)  Does patient want to make changes to medical advance directive? No - Patient declined  Copy of [place] in Chart? -  Would patient like information on creating a medical advance directive? No - Patient declined  Pre-existing out of facility DNR order (yellow form or pink MOST form) Yellow form placed in chart (order not valid for inpatient use)      Chief Complaint  Patient presents with  . Readmit To SNF    Readmission    HPI: Patient is a 85 y.o. female seen today for admission to SNF for End of Life Care She was admitted to the hospital  from 41/96-22/29 for Diastolic CHF and Lobar Pneumonia. Patient has a history of hypertension, chronic diastolic CHF and GERD She  was  initially admitted to  the hospitalFrom [facility]10[facility]17 For Left Hip Fracture requiringanterior trochanteric nail placement on 1/11 She was doing well in the facility but was send to the hospital for SOB and she was hypoxic on 2 l of Oxygen. She was found to have Right LL infiltrate  She was treated with Antibiotics and Diuresis. She was also treated with prednisone. She is feeling much better. Her family has decided  to make her comfort  care and hospice is involved. Her main complain today was Back pain other wise her breathing is better. No coughing or fever.  Past Medical History:  Diagnosis Date  . Chronic anemia 05/01/2018  . Chronic diastolic heart failure ([place]) 06/12/2018  . CKD (chronic kidney disease), stage III ([place]) 05/01/2018  . Closed fracture of olecranon process of ulna 11/07/2009   Qualifier: Diagnosis of  By: Danne Harbor, Chasity L   . Constipation 05/01/2018  . CTS (carpal tunnel syndrome) 03/04/2011  . GERD (gastroesophageal reflux disease)   . RUPTURE ROTATOR CUFF 03/19/2010   Qualifier: Diagnosis of  By: Aline Brochure MD, Dorothyann Peng     Past Surgical History:  Procedure Laterality Date  . APPENDECTOMY     85 yr old  . BREAST LUMPECTOMY    . CATARACT EXTRACTION    . ELBOW SURGERY    . HEMORROIDECTOMY    . HERNIA REPAIR    . INTRAMEDULLARY (IM) NAIL INTERTROCHANTERIC Left 05/01/2018   Procedure: LEFT BIOMET AFFIXUS (IM) SHORT NAIL, INTERTROCHANTRIC FRACTURE;  Surgeon: [name], MD;  Location: [facility];  Service: Orthopedics;  Laterality: Left;  . SHOULDER SURGERY     left rotator cuff  . TONSILLECTOMY AND ADENOIDECTOMY     11 yr  . tubal removal      reports that she has never smoked. She has never used smokeless tobacco. She reports that she does not drink alcohol. No history on file for drug. Social History   Socioeconomic History  . Marital status: Widowed  Spouse name: Not on file  . Number of children: Not on file  . Years of education: Not on file  . Highest education level: Not on file  Occupational History  . Not on file  Social Needs  . Financial resource strain: Not on file  . Food insecurity:    Worry: Not on file    Inability: Not on file  . Transportation needs:    Medical: Not on file    Non-medical: Not on file  Tobacco Use  . Smoking status: Never Smoker  . Smokeless tobacco: Never Used  Substance and Sexual Activity  . Alcohol use: No  . Drug use: Not on file  .  Sexual activity: Not on file  Lifestyle  . Physical activity:    Days per week: Not on file    Minutes per session: Not on file  . Stress: Not on file  Relationships  . Social connections:    Talks on phone: Not on file    Gets together: Not on file    Attends religious service: Not on file    Active member of club or organization: Not on file    Attends meetings of clubs or organizations: Not on file    Relationship status: Not on file  . Intimate partner violence:    Fear of current or ex partner: Not on file    Emotionally abused: Not on file    Physically abused: Not on file    Forced sexual activity: Not on file  Other Topics Concern  . Not on file  Social History Narrative  . Not on file    Functional Status Survey:    Family History  Problem Relation Age of Onset  . Cancer Other   . Diabetes Other     Health Maintenance  Topic Date Due  . PNA vac Low Risk Adult (1 of 2 - PCV13) 07/08/2018 (Originally 03/15/1976)  . TETANUS/TDAP  12/11/2022  . INFLUENZA VACCINE  Completed  . DEXA SCAN  Completed    Allergies  Allergen Reactions  . Aspirin     Stomach problem  . Codeine     hallucinate  . Penicillins Rash    Has patient had a PCN reaction causing immediate rash, facial/tongue/throat swelling, SOB or lightheadedness with hypotension: Yes Has patient had a PCN reaction causing severe rash involving mucus membranes or skin necrosis: No Has patient had a PCN reaction that required hospitalization No Has patient had a PCN reaction occurring within the last 10 years: No If all of the above answers are "NO", then may proceed with Cephalosporin use.     Outpatient Encounter Medications as of 07/01/2018  Medication Sig  . acetaminophen (TYLENOL) 500 MG tablet Take 1,000 mg by mouth every 8 (eight) hours.   Marland Kitchen albuterol (PROVENTIL) (2.5 MG/3ML) 0.083% nebulizer solution Take 3 mLs (2.5 mg total) by nebulization every 4 (four) hours as needed for wheezing or  shortness of breath.  . budesonide (PULMICORT) 0.5 MG/2ML nebulizer solution Take 2 mLs (0.5 mg total) by nebulization 2 (two) times daily.  Marland Kitchen docusate sodium (COLACE) 100 MG capsule Take 100 mg by mouth 2 (two) times daily.  . ferrous sulfate 325 (65 FE) MG EC tablet Take 325 mg by mouth daily with breakfast.  . furosemide (LASIX) 40 MG tablet Take 1 tablet (40 mg total) by mouth 2 (two) times daily.  . Glucerna (GLUCERNA) LIQD Take 237 mLs by mouth daily.  Marland Kitchen ipratropium-albuterol (DUONEB) 0.5-2.5 (3) MG/3ML SOLN  Take 3 mLs by nebulization every 8 (eight) hours.  . NON FORMULARY Diet Type:  D3 with nectar thickened liquids  . omeprazole (PRILOSEC) 20 MG capsule Take 20 mg by mouth daily.   . OXYGEN Inhale 2 L/min into the lungs continuous.  . polyethylene glycol (MIRALAX / GLYCOLAX) packet Take 17 g by mouth daily as needed for mild constipation or moderate constipation.  . potassium chloride SA (K-DUR,KLOR-CON) 20 MEQ tablet Take 2 tablets (40 mEq total) by mouth daily.  . predniSONE (DELTASONE) 20 MG tablet Take 1 tablet (20 mg total) by mouth daily with breakfast for 3 days.  . sennosides-docusate sodium (SENOKOT-S) 8.6-50 MG tablet Take 1 tablet by mouth 2 (two) times daily.   No facility-administered encounter medications on file as of 07/01/2018.     Review of Systems  Constitutional: Positive for activity change and appetite change.  HENT: Negative.   Respiratory: Negative.   Cardiovascular: Positive for leg swelling.  Gastrointestinal: Negative.   Genitourinary: Negative.   Musculoskeletal: Positive for arthralgias and myalgias.  Skin: Negative.   Neurological: Positive for weakness.  Psychiatric/Behavioral: Negative.   All other systems reviewed and are negative.   Vitals:   07/01/18 1327  BP: (!) 85/52  Pulse: (!) 59  Resp: 16  Temp: (!) 96.6 F (35.9 C)  SpO2: 99%  Weight: 152 lb (68.9 kg)  Height: 5\' 3"  (1.6 m)   Body mass index is 26.93 kg/m. Physical Exam  Vitals signs and nursing note reviewed.  Constitutional:      Appearance: Normal appearance.  HENT:     Head: Normocephalic.     Nose: Nose normal.     Mouth/Throat:     Mouth: Mucous membranes are moist.     Pharynx: Oropharynx is clear.  Eyes:     Pupils: Pupils are equal, round, and reactive to light.  Neck:     Musculoskeletal: Neck supple.  Cardiovascular:     Rate and Rhythm: Normal rate and regular rhythm.     Pulses: Normal pulses.     Heart sounds: Normal heart sounds.  Pulmonary:     Effort: Pulmonary effort is normal. No respiratory distress.     Breath sounds: Normal breath sounds. No wheezing or rales.  Abdominal:     General: Abdomen is flat. Bowel sounds are normal. There is no distension.     Palpations: Abdomen is soft.     Tenderness: There is no abdominal tenderness. There is no guarding.  Musculoskeletal:     Comments: Moderate Swelling Bilateral  Skin:    General: Skin is warm and dry.  Neurological:     General: No focal deficit present.     Mental Status: She is alert and oriented to person, place, and time.  Psychiatric:        Mood and Affect: Mood normal.        Thought Content: Thought content normal.        Judgment: Judgment normal.     Labs reviewed: Basic Metabolic Panel: Recent Labs    06/22/18 0531  06/26/18 0705 06/27/18 0706 06/21/2018 0537  NA 145   < > 140 141 139  K 3.9   < > 3.4* 3.5 3.2*  CL 98   < > 86* 86* 89*  CO2 38*   < > 43* 46* 42*  GLUCOSE 172*   < > 105* 98 104*  BUN 30*   < > 46* 45* 41*  CREATININE 0.85   < > 0.75  0.73 0.69  CALCIUM 8.6*   < > 9.1 8.7* 8.5*  MG 1.8  --   --   --   --    < > = values in this interval not displayed.   Liver Function Tests: Recent Labs    04/30/18 2003 06/20/18 0855  AST 17 17  ALT 10 10  ALKPHOS 51 57  BILITOT 0.7 0.8  PROT 5.9* 5.7*  ALBUMIN 3.4* 2.7*   No results for input(s): LIPASE, AMYLASE in the last 8760 hours. No results for input(s): AMMONIA in the last 8760  hours. CBC: Recent Labs    05/31/18 0700 06/20/18 0855 06/21/18 0542 06/22/18 0531  WBC 4.8 7.1 4.2 3.0*  NEUTROABS 2.5 5.0  --  2.2  HGB 8.9* 9.2* 9.8* 9.4*  HCT 30.3* 31.3* 32.5* 31.2*  MCV 104.5* 104.3* 103.5* 103.7*  PLT 159 163 154 154   Cardiac Enzymes: Recent Labs    06/20/18 0855 06/20/18 1329 06/20/18 2022  TROPONINI 0.15* 0.14* 0.11*   BNP: Invalid input(s): POCBNP No results found for: HGBA1C No results found for: TSH Lab Results  Component Value Date   VITAMINB12 449 06/22/2018   Lab Results  Component Value Date   FOLATE 14.0 06/22/2018   Lab Results  Component Value Date   IRON 20 (L) 05/12/2013   TIBC 242 (L) 05/12/2013   FERRITIN 58 05/12/2013    Imaging and Procedures obtained prior to SNF admission: No results found.  Assessment/Plan  Chronic diastolic congestive heart failurewith LE edema Doing well on Lasix 40 mg BID On Potassium supplement Compression stockings Hypoxia On Oxygen  Chronic anemia On iron Closed fracture of left hip S/P ORIF on 01/11 Stays Full Assist She is WBAT Will start her on Norco 5/325 mg  Q 4 hours for Pain Family/ staff Communication:  D/W the family in the room and Hospice nurse Labs/tests ordered: Total time spent in this patient care encounter was 45_ minutes; greater than 50% of the visit spent counseling patient, reviewing records , Labs and coordinating care for problems addressed at this encounter.

## 2018-07-01 NOTE — Progress Notes (Deleted)
Location:  [facility] Room Number: [place] of Service:  SNF [phone]) Provider:  Veleta Miners, MD  Celene Squibb, MD  Patient Care Team: Celene Squibb, MD as PCP - General (Internal Medicine)  Extended Emergency Contact Information Primary Emergency Contact: Roznowski,EllEs Address: [address]          [place],  [address] [place] Phone: [phone] Mobile Phone: [phone] Relation: Son Secondary Emergency Contact: Jamison, Yuhasz Mobile Phone: [phone] Relation: Grandson  Code Status:  DNR Goals of care: Advanced Directive information Advanced Directives 07/01/2018  Does Patient Have a Medical Advance Directive? Yes  Type of Advance Directive Out of facility DNR (pink MOST or yellow form)  Does patient want to make changes to medical advance directive? No - Patient declined  Copy of [place] in Chart? -  Would patient like information on creating a medical advance directive? No - Patient declined  Pre-existing out of facility DNR order (yellow form or pink MOST form) Yellow form placed in chart (order not valid for inpatient use)     Chief Complaint  Patient presents with   Readmit To SNF    Readmission    HPI:  Pt is a 85 y.o. female seen today for an acute visit for    Past Medical History:  Diagnosis Date   Chronic anemia 05/01/2018   Chronic diastolic heart failure ([place]) 06/12/2018   CKD (chronic kidney disease), stage III ([facility]) 05/01/2018   Closed fracture of olecranon process of ulna 11/07/2009   Qualifier: Diagnosis of  By: Danne Harbor, Chasity L    Constipation 05/01/2018   CTS (carpal tunnel syndrome) 03/04/2011   GERD (gastroesophageal reflux disease)    RUPTURE ROTATOR CUFF 03/19/2010   Qualifier: Diagnosis of  By: Aline Brochure MD, Dorothyann Peng     Past Surgical History:  Procedure Laterality Date   APPENDECTOMY     85 yr old   BREAST LUMPECTOMY     CATARACT EXTRACTION      [place] (IM) NAIL INTERTROCHANTERIC Left 05/01/2018   Procedure: LEFT BIOMET AFFIXUS (IM) SHORT NAIL, INTERTROCHANTRIC FRACTURE;  Surgeon: [name], MD;  Location: [facility];  Service: Orthopedics;  Laterality: Left;   SHOULDER SURGERY     left rotator cuff   TONSILLECTOMY AND ADENOIDECTOMY     9 yr   tubal removal      Allergies  Allergen Reactions   Aspirin     Stomach problem   Codeine     hallucinate   Penicillins Rash    Has patient had a PCN reaction causing immediate rash, facial/tongue/throat swelling, SOB or lightheadedness with hypotension: Yes Has patient had a PCN reaction causing severe rash involving mucus membranes or skin necrosis: No Has patient had a PCN reaction that required hospitalization No Has patient had a PCN reaction occurring within the last 10 years: No If all of the above answers are "NO", then may proceed with Cephalosporin use.     Outpatient Encounter Medications as of 07/01/2018  Medication Sig   acetaminophen (TYLENOL) 500 MG tablet Take 1,000 mg by mouth every 8 (eight) hours.    albuterol (PROVENTIL) (2.5 MG/3ML) 0.083% nebulizer solution Take 3 mLs (2.5 mg total) by nebulization every 4 (four) hours as needed for wheezing or shortness of breath.   budesonide (PULMICORT) 0.5 MG/2ML nebulizer solution Take 2 mLs (  0.5 mg total) by nebulization 2 (two) times daily.   docusate sodium (COLACE) 100 MG capsule Take 100 mg by mouth 2 (two) times daily.   ferrous sulfate 325 (65 FE) MG EC tablet Take 325 mg by mouth daily with breakfast.   furosemide (LASIX) 40 MG tablet Take 1 tablet (40 mg total) by mouth 2 (two) times daily.   Glucerna (GLUCERNA) LIQD Take 237 mLs by mouth daily.   ipratropium-albuterol (DUONEB) 0.5-2.5 (3) MG/3ML SOLN Take 3 mLs by nebulization every 8 (eight) hours.   NON FORMULARY Diet Type:  D3 with nectar thickened liquids   omeprazole  (PRILOSEC) 20 MG capsule Take 20 mg by mouth daily.    OXYGEN Inhale 2 L/min into the lungs continuous.   polyethylene glycol (MIRALAX / GLYCOLAX) packet Take 17 g by mouth daily as needed for mild constipation or moderate constipation.   potassium chloride SA (K-DUR,KLOR-CON) 20 MEQ tablet Take 2 tablets (40 mEq total) by mouth daily.   predniSONE (DELTASONE) 20 MG tablet Take 1 tablet (20 mg total) by mouth daily with breakfast for 3 days.   sennosides-docusate sodium (SENOKOT-S) 8.6-50 MG tablet Take 1 tablet by mouth 2 (two) times daily.   No facility-administered encounter medications on file as of 07/01/2018.     Review of Systems  Immunization History  Administered Date(s) Administered   Influenza, High Dose Seasonal PF 01/21/2018   Tdap 12/10/2012   Pertinent  Health Maintenance Due  Topic Date Due   PNA vac Low Risk Adult (1 of 2 - PCV13) 07/08/2018 (Originally 03/15/1976)   INFLUENZA VACCINE  Completed   DEXA SCAN  Completed   No flowsheet data found. Functional Status Survey:    Vitals:   07/01/18 1158  BP: (!) 85/52  Pulse: (!) 59  Resp: 16  Temp: (!) 96.6 F (35.9 C)  SpO2: 99%  Weight: 152 lb (68.9 kg)  Height: 5\' 3"  (1.6 m)   Body mass index is 26.93 kg/m. Physical Exam  Labs reviewed: Recent Labs    06/22/18 0531  06/26/18 0705 06/27/18 0706 07/02/2018 0537  NA 145   < > 140 141 139  K 3.9   < > 3.4* 3.5 3.2*  CL 98   < > 86* 86* 89*  CO2 38*   < > 43* 46* 42*  GLUCOSE 172*   < > 105* 98 104*  BUN 30*   < > 46* 45* 41*  CREATININE 0.85   < > 0.75 0.73 0.69  CALCIUM 8.6*   < > 9.1 8.7* 8.5*  MG 1.8  --   --   --   --    < > = values in this interval not displayed.   Recent Labs    04/30/18 2003 06/20/18 0855  AST 17 17  ALT 10 10  ALKPHOS 51 57  BILITOT 0.7 0.8  PROT 5.9* 5.7*  ALBUMIN 3.4* 2.7*   Recent Labs    05/31/18 0700 06/20/18 0855 06/21/18 0542 06/22/18 0531  WBC 4.8 7.1 4.2 3.0*  NEUTROABS 2.5 5.0  --  2.2    HGB 8.9* 9.2* 9.8* 9.4*  HCT 30.3* 31.3* 32.5* 31.2*  MCV 104.5* 104.3* 103.5* 103.7*  PLT 159 163 154 154   No results found for: TSH No results found for: HGBA1C No results found for: CHOL, HDL, LDLCALC, LDLDIRECT, TRIG, CHOLHDL  Significant Diagnostic Results in last 30 days:  Dg Chest 2 View  Result Date: 06/20/2018 CLINICAL DATA:  Shortness of breath and  productive cough for 1 week. EXAM: CHEST - 2 VIEW COMPARISON:  05/03/2018 FINDINGS: Stable mild cardiomegaly and ectasia of the thoracic aorta. Aortic atherosclerosis. New right lower lobe opacity and small right pleural effusion, suspicious for pneumonia. Left lung remains clear. IMPRESSION: New right lower lobe opacity and small right pleural effusion, suspicious for pneumonia. Recommend chest radiographic follow-up to ensure resolution. Electronically Signed   By: Earle Gell M.D.   On: 06/20/2018 10:05   Dg Chest Port 1 View  Result Date: 06/25/2018 CLINICAL DATA:  Shortness of breath, chronic kidney disease EXAM: PORTABLE CHEST 1 VIEW COMPARISON:  06/23/2010 FINDINGS: Cardiomegaly. Tortuous calcified aorta. Right upper mediastinal soft tissue prominence shown on prior CT to represent a combination of nodular goiter and prominent tortuous great vessels. Right basilar atelectasis or infiltrate with left base atelectasis. No definite visible effusions or acute bony abnormality. No significant change since prior study. IMPRESSION: No significant change since prior study. Electronically Signed   By: Rolm Baptise M.D.   On: 06/25/2018 07:53   Dg Chest Port 1 View  Result Date: 06/23/2018 CLINICAL DATA:  Difficulty breathing EXAM: PORTABLE CHEST 1 VIEW COMPARISON:  06/21/2018 FINDINGS: Cardiac shadow is stable. Aortic calcifications and tortuosity are again seen. Stable scoliosis is noted. Increasing consolidation in the right base is noted with right-sided pleural effusion. Persistent density in the right paratracheal region is seen related to  tortuous vascularity IMPRESSION: Increasing right basilar consolidation and effusion. Electronically Signed   By: Inez Catalina M.D.   On: 06/23/2018 07:39   Dg Chest Port 1 View  Result Date: 06/21/2018 CLINICAL DATA:  Labored breathing EXAM: PORTABLE CHEST 1 VIEW COMPARISON:  06/20/2018, CT 05/03/2018, radiograph 04/30/2018 FINDINGS: Bilateral pleural effusions, right greater than left with continued airspace disease at the right lung base. Stable cardiomediastinal silhouette with aortic atherosclerosis. No pneumothorax. IMPRESSION: 1. Similar appearance of right greater than left pleural effusion with continued airspace disease at the right base which may reflect atelectasis or pneumonia Electronically Signed   By: Donavan Foil M.D.   On: 06/21/2018 02:04   Dg Swallowing Func-speech Pathology  Result Date: 06/25/2018 Objective Swallowing Evaluation: Type of Study: MBS-Modified Barium Swallow Study  Patient Details Name: Hannah Arias MRN: [id number] Date of Birth: 01-22-1911 Today's Date: 06/25/2018 Time: SLP Start Time (ACUTE ONLY): 8185 -SLP Stop Time (ACUTE ONLY): 6314 SLP Time Calculation (min) (ACUTE ONLY): 23 min Past Medical History: Past Medical History: Diagnosis Date  Chronic anemia 9/70/2637  Chronic diastolic heart failure ([facility]) 06/12/2018  CKD (chronic kidney disease), stage III ([place]) 05/01/2018  Closed fracture of olecranon process of ulna 11/07/2009  Qualifier: Diagnosis of  By: Danne Harbor, Chasity L   Constipation 05/01/2018  CTS (carpal tunnel syndrome) 03/04/2011  GERD (gastroesophageal reflux disease)   RUPTURE ROTATOR CUFF 03/19/2010  Qualifier: Diagnosis of  By: Aline Brochure MD, Dorothyann Peng   Past Surgical History: Past Surgical History: Procedure Laterality Date  APPENDECTOMY    85 yr old  BREAST LUMPECTOMY    CATARACT EXTRACTION    ELBOW SURGERY    HEMORROIDECTOMY    HERNIA REPAIR    [place] ([place]) NAIL INTERTROCHANTERIC Left 05/01/2018  Procedure: LEFT BIOMET AFFIXUS ([place]) SHORT  NAIL, INTERTROCHANTRIC FRACTURE;  Surgeon: [name], MD;  Location: [facility];  Service: Orthopedics;  Laterality: Left;  SHOULDER SURGERY    left rotator cuff  TONSILLECTOMY AND ADENOIDECTOMY    9 yr  tubal removal   HPI: 85 y.o. female with medical history of diastolic CHF, GERD,  and recent left hip fracture presenting with 1 to 2-day history of worsening shortness of breath.  Review the medical record shows that the patient has had some shortness of breath for approximately 1 week.  She denies any fevers, chills, chest pain but complains of a nonproductive cough and chest congestion.    Patient was recently admitted to the hospital from 04/30/2018 through 05/07/2018 secondary to a left hip fracture from mechanical fall.  She underwent ORIF of the left hip on 05/01/2018, and she was discharged to the [facility] for rehab.  Since she has been at the [facility], it is been noted that she has had some hypoxia and worsening lower extremity edema.  Notably, the patient had her furosemide increased by her primary provider on 06/09/2018 from 20 mg daily to 40 mg daily.  During that office visit, the patient had a weight of 171 pounds.  However, it was noted on 05/28/2018 during a medical visit, the patient had a weight of 163 pounds.  Prior to her admission for her left hip fracture, the patient did not require any supplemental oxygen.  However since she has transitioned to the [facility], she has been placed on 2 L nasal cannula secondary to hypoxia and shortness of breath.  The patient does note some occasional orthopnea type symptoms with worsening lower extremity edema. BSE determined need for instrumental testing.  Subjective: "Today is the first day I have felt like eating anything. I want some bread." Assessment / Plan / Recommendation CHL IP CLINICAL IMPRESSIONS 06/25/2018 Clinical Impression Pt presents with mild/moderate sensorimotor oropharyngeal dysphagia characterized by consistent trace to minimal  amount of penetration of thin liquids during the swallow to the level of the cords with subsequent silent aspiration after the swallow secondary to decreased Laryngeal vestibule closure (LVC). Oral phase is characterized by decreased bolus cohesion and premature spillage to the level of the pyriforms; mild to mod residue of thin liquids pooling in the valleculae and pyriforms after the swallow of which she is not sensate to. Consistent pharyngeal residue of all consistencies noted after the swallow secondary to decreased strength and duration of pharyngeal squeeze during the swallow. Volume dependent improved LVC manifested by no penetration or aspiration visualized with NTL or puree trials. Difficulty with oral AP tranist of pill requiring multiple trials and consistencies to transfer; Pill with stasis in the valleculae requiring again multiple trials of NTL and puree to pass through pharyngeal space. Esophageal sweep reveals stasis of the pill in the upper esophagus with what appeared to be reduced esophageal paristalsis and despite multiple trials and additional administrations was not visualized passing through the distal esophagus. No Radiologist present to confirm. Recommend continue D3/mechanical soft diet with NTL  with precautions re: small sips/bites, alternate bites and sips, take additional dry swallows throughout meal, always be seated upright for all PO. Recommend meds be administered crushed in puree. ST will continue to follow for diet tolerance and family/patient education.  SLP Visit Diagnosis Dysphagia, oropharyngeal phase (R13.12) Attention and concentration deficit following -- Frontal lobe and executive function deficit following -- Impact on safety and function Mild aspiration risk;Moderate aspiration risk   CHL IP TREATMENT RECOMMENDATION 06/25/2018 Treatment Recommendations Therapy as outlined in treatment plan below   Prognosis 06/25/2018 Prognosis for Safe Diet Advancement Good Barriers to  Reach Goals -- Barriers/Prognosis Comment -- CHL IP DIET RECOMMENDATION 06/25/2018 SLP Diet Recommendations Dysphagia 3 (Mech soft) solids;Nectar thick liquid Liquid Administration via -- Medication Administration Crushed with puree  Compensations Slow rate;Small sips/bites;Lingual sweep for clearance of pocketing Postural Changes --   CHL IP OTHER RECOMMENDATIONS 06/25/2018 Recommended Consults -- Oral Care Recommendations Oral care BID Other Recommendations Order thickener from pharmacy   CHL IP FOLLOW UP RECOMMENDATIONS 06/25/2018 Follow up Recommendations 24 hour supervision/assistance;Skilled Nursing facility   [facility] IP FREQUENCY AND DURATION 06/25/2018 Speech Therapy Frequency (ACUTE ONLY) min 2x/week Treatment Duration 1 week      CHL IP ORAL PHASE 06/25/2018 Oral Phase Impaired Oral - Pudding Teaspoon -- Oral - Pudding Cup -- Oral - Honey Teaspoon -- Oral - Honey Cup -- Oral - Nectar Teaspoon -- Oral - Nectar Cup Premature spillage;Decreased bolus cohesion Oral - Nectar Straw -- Oral - Thin Teaspoon -- Oral - Thin Cup Premature spillage;Decreased bolus cohesion;Piecemeal swallowing;Right anterior bolus loss Oral - Thin Straw Premature spillage;Delayed oral transit;Decreased bolus cohesion;Right anterior bolus loss Oral - Puree Premature spillage;Holding of bolus;Reduced posterior propulsion Oral - Mech Soft -- Oral - Regular -- Oral - Multi-Consistency -- Oral - Pill Premature spillage;Holding of bolus;Reduced posterior propulsion;Lingual pumping Oral Phase - Comment --  CHL IP PHARYNGEAL PHASE 06/25/2018 Pharyngeal Phase -- Pharyngeal- Pudding Teaspoon -- Pharyngeal -- Pharyngeal- Pudding Cup -- Pharyngeal -- Pharyngeal- Honey Teaspoon -- Pharyngeal -- Pharyngeal- Honey Cup -- Pharyngeal -- Pharyngeal- Nectar Teaspoon -- Pharyngeal -- Pharyngeal- Nectar Cup -- Pharyngeal -- Pharyngeal- Nectar Straw -- Pharyngeal -- Pharyngeal- Thin Teaspoon -- Pharyngeal -- Pharyngeal- Thin Cup -- Pharyngeal Material enters airway, passes  BELOW cords without attempt by patient to eject out (silent aspiration) Pharyngeal- Thin Straw -- Pharyngeal Material enters airway, passes BELOW cords without attempt by patient to eject out (silent aspiration) Pharyngeal- Puree -- Pharyngeal -- Pharyngeal- Mechanical Soft -- Pharyngeal -- Pharyngeal- Regular -- Pharyngeal -- Pharyngeal- Multi-consistency -- Pharyngeal -- Pharyngeal- Pill -- Pharyngeal -- Pharyngeal Comment --  CHL IP CERVICAL ESOPHAGEAL PHASE 06/25/2018 Cervical Esophageal Phase Impaired Pudding Teaspoon -- Pudding Cup -- Honey Teaspoon -- Honey Cup -- Nectar Teaspoon -- Nectar Cup -- Nectar Straw -- Thin Teaspoon -- Thin Cup -- Thin Straw -- Puree -- Mechanical Soft -- Regular -- Multi-consistency -- Pill -- Cervical Esophageal Comment -- Amelia H. Roddie Mc, CCC-SLP Speech Language Pathologist Wende Bushy 06/25/2018, 3:30 PM              Xr Hip Unilat W Or W/o Pelvis 2-3 Views Left  Result Date: 06/17/2018 AP lateral left hip obtained and reviewed this shows trochanteric nail with interval healing.  No evidence of loosening or subsidence. Impression: Satisfactory postop ORIF left hip fracture with truck nail.   Assessment/Plan There are no diagnoses linked to this encounter.   Family/ staff Communication: ***  Labs/tests ordered:  ***

## 2018-07-02 ENCOUNTER — Other Ambulatory Visit: Payer: Self-pay | Admitting: Adult Health

## 2018-07-02 MED ORDER — HYDROCODONE-ACETAMINOPHEN 5-325 MG PO TABS: 1.0000 | ORAL_TABLET | ORAL | 0 refills | Status: DC | PRN

## 2018-07-04 DIAGNOSIS — K219 Gastro-esophageal reflux disease without esophagitis: Secondary | ICD-10-CM | POA: Insufficient documentation

## 2018-07-04 DIAGNOSIS — E43 Unspecified severe protein-calorie malnutrition: Secondary | ICD-10-CM | POA: Insufficient documentation

## 2018-07-04 DIAGNOSIS — R1312 Dysphagia, oropharyngeal phase: Secondary | ICD-10-CM | POA: Insufficient documentation

## 2018-07-05 ENCOUNTER — Encounter (HOSPITAL_COMMUNITY)
Admission: RE | Admit: 2018-07-05 | Discharge: 2018-07-05 | Disposition: A | Discharge status: Home / Self Care | Payer: Medicare HMO | Source: Skilled Nursing Facility | Attending: Internal Medicine | Admitting: Internal Medicine

## 2018-07-05 DIAGNOSIS — Z9181 History of falling: Secondary | ICD-10-CM | POA: Diagnosis not present

## 2018-07-05 DIAGNOSIS — Z4789 Encounter for other orthopedic aftercare: Secondary | ICD-10-CM | POA: Diagnosis not present

## 2018-07-05 DIAGNOSIS — D649 Anemia, unspecified: Secondary | ICD-10-CM | POA: Diagnosis not present

## 2018-07-05 DIAGNOSIS — Z9981 Dependence on supplemental oxygen: Secondary | ICD-10-CM | POA: Diagnosis not present

## 2018-07-05 DIAGNOSIS — I10 Essential (primary) hypertension: Secondary | ICD-10-CM | POA: Diagnosis not present

## 2018-07-05 DIAGNOSIS — R69 Illness, unspecified: Secondary | ICD-10-CM | POA: Diagnosis not present

## 2018-07-05 LAB — COMPREHENSIVE METABOLIC PANEL
ALBUMIN: 2.7 g/dL — AB (ref 3.5–5.0)
ALT: 13 U/L (ref 0–44)
AST: 14 U/L — ABNORMAL LOW (ref 15–41)
Alkaline Phosphatase: 52 U/L (ref 38–126)
Anion gap: 9 (ref 5–15)
BILIRUBIN TOTAL: 0.9 mg/dL (ref 0.3–1.2)
BUN: 41 mg/dL — ABNORMAL HIGH (ref 8–23)
CO2: 34 mmol/L — ABNORMAL HIGH (ref 22–32)
Calcium: 8.6 mg/dL — ABNORMAL LOW (ref 8.9–10.3)
Chloride: 96 mmol/L — ABNORMAL LOW (ref 98–111)
Creatinine, Ser: 0.82 mg/dL (ref 0.44–1.00)
GFR calc Af Amer: >60 mL/min (ref >60)
GFR calc non Af Amer: 57 mL/min — ABNORMAL LOW (ref >60)
GLUCOSE: 90 mg/dL (ref 70–99)
Potassium: 4.2 mmol/L (ref 3.5–5.1)
Sodium: 139 mmol/L (ref 135–145)
TOTAL PROTEIN: 5.2 g/dL — AB (ref 6.5–8.1)

## 2018-07-05 LAB — CBC WITH DIFFERENTIAL/PLATELET
Abs Immature Granulocytes: 0.09 10*3/uL — ABNORMAL HIGH (ref 0.00–0.07)
BASOS ABS: 0.1 10*3/uL (ref 0.0–0.1)
Basophils Relative: 1 %
Eosinophils Absolute: 0.2 10*3/uL (ref 0.0–0.5)
Eosinophils Relative: 2 %
HEMATOCRIT: 28.7 % — AB (ref 36.0–46.0)
Hemoglobin: 8.7 g/dL — ABNORMAL LOW (ref 12.0–15.0)
Immature Granulocytes: 1 %
LYMPHS ABS: 1.5 10*3/uL (ref 0.7–4.0)
Lymphocytes Relative: 17 %
MCH: 31.4 pg (ref 26.0–34.0)
MCHC: 30.3 g/dL (ref 30.0–36.0)
MCV: 103.6 fL — ABNORMAL HIGH (ref 80.0–100.0)
Monocytes Absolute: 0.9 10*3/uL (ref 0.1–1.0)
Monocytes Relative: 10 %
Neutro Abs: 5.9 10*3/uL (ref 1.7–7.7)
Neutrophils Relative %: 69 %
Platelets: 140 10*3/uL — ABNORMAL LOW (ref 150–400)
RBC: 2.77 MIL/uL — ABNORMAL LOW (ref 3.87–5.11)
RDW: 15.5 % (ref 11.5–15.5)
WBC: 8.5 10*3/uL (ref 4.0–10.5)
nRBC: 0 % (ref 0.0–0.2)

## 2018-07-06 ENCOUNTER — Other Ambulatory Visit: Payer: Self-pay | Admitting: Adult Health

## 2018-07-06 ENCOUNTER — Encounter: Payer: Self-pay | Admitting: Adult Health

## 2018-07-06 ENCOUNTER — Non-Acute Institutional Stay (SKILLED_NURSING_FACILITY): Payer: Medicare HMO | Admitting: Adult Health

## 2018-07-06 DIAGNOSIS — N183 Chronic kidney disease, stage 3 unspecified: Secondary | ICD-10-CM

## 2018-07-06 DIAGNOSIS — M2550 Pain in unspecified joint: Secondary | ICD-10-CM

## 2018-07-06 DIAGNOSIS — G8929 Other chronic pain: Secondary | ICD-10-CM | POA: Diagnosis not present

## 2018-07-06 DIAGNOSIS — S72002S Fracture of unspecified part of neck of left femur, sequela: Secondary | ICD-10-CM | POA: Diagnosis not present

## 2018-07-06 DIAGNOSIS — J9611 Chronic respiratory failure with hypoxia: Secondary | ICD-10-CM | POA: Diagnosis not present

## 2018-07-06 MED ORDER — HYDROCODONE-ACETAMINOPHEN 5-325 MG PO TABS: 1.0000 | ORAL_TABLET | Freq: Four times a day (QID) | ORAL | 0 refills | Status: DC

## 2018-07-06 NOTE — Progress Notes (Signed)
Location:    [facility] Room Number: 103/P Place of Service:  SNF (31)   CODE STATUS: DNR  Allergies  Allergen Reactions  . Aspirin     Stomach problem  . Codeine     hallucinate  . Penicillins Rash    Has patient had a PCN reaction causing immediate rash, facial/tongue/throat swelling, SOB or lightheadedness with hypotension: Yes Has patient had a PCN reaction causing severe rash involving mucus membranes or skin necrosis: No Has patient had a PCN reaction that required hospitalization No Has patient had a PCN reaction occurring within the last 10 years: No If all of the above answers are "NO", then may proceed with Cephalosporin use.     Chief Complaint  Patient presents with  . Medical Management of Chronic Issues    CKD (chronic kidney disease) stage III: closed fracture of left hip sequela; chronic pain of multiple joints; chronic respiratory failure with hypoxia. Weekly follow up for the first 30 days post hospitalization.     HPI:  She is a 85 year old long term resident of this faciity being seen for the management of her chronic illnesses: ckd stage 3; left hip fracture; chronic pain; chronic respiratory failure. She continues to be followed by hospice care. There are reports of uncontrolled pain; she does yell out often due to pain. She continues to have a poor appetite.   Past Medical History:  Diagnosis Date  . Chronic anemia 05/01/2018  . Chronic diastolic heart failure ([place]) 06/12/2018  . CKD (chronic kidney disease), stage III ([place]) 05/01/2018  . Closed fracture of olecranon process of ulna 11/07/2009   Qualifier: Diagnosis of  By: Danne Harbor, Chasity L   . Constipation 05/01/2018  . CTS (carpal tunnel syndrome) 03/04/2011  . GERD (gastroesophageal reflux disease)   . RUPTURE ROTATOR CUFF 03/19/2010   Qualifier: Diagnosis of  By: Aline Brochure MD, Dorothyann Peng      Past Surgical History:  Procedure Laterality Date  . APPENDECTOMY     85 yr  old  . BREAST LUMPECTOMY    . CATARACT EXTRACTION    . ELBOW SURGERY    . HEMORROIDECTOMY    . HERNIA REPAIR    . INTRAMEDULLARY (IM) NAIL INTERTROCHANTERIC Left 05/01/2018   Procedure: LEFT BIOMET AFFIXUS (IM) SHORT NAIL, INTERTROCHANTRIC FRACTURE;  Surgeon: [name], MD;  Location: [facility];  Service: Orthopedics;  Laterality: Left;  . SHOULDER SURGERY     left rotator cuff  . TONSILLECTOMY AND ADENOIDECTOMY     11 yr  . tubal removal      Social History   Socioeconomic History  . Marital status: Widowed    Spouse name: Not on file  . Number of children: Not on file  . Years of education: Not on file  . Highest education level: Not on file  Occupational History  . Not on file  Social Needs  . Financial resource strain: Not on file  . Food insecurity:    Worry: Not on file    Inability: Not on file  . Transportation needs:    Medical: Not on file    Non-medical: Not on file  Tobacco Use  . Smoking status: Never Smoker  . Smokeless tobacco: Never Used  Substance and Sexual Activity  . Alcohol use: No  . Drug use: Not on file  . Sexual activity: Not on file  Lifestyle  . Physical activity:    Days per week: Not on file  Minutes per session: Not on file  . Stress: Not on file  Relationships  . Social connections:    Talks on phone: Not on file    Gets together: Not on file    Attends religious service: Not on file    Active member of club or organization: Not on file    Attends meetings of clubs or organizations: Not on file    Relationship status: Not on file  . Intimate partner violence:    Fear of current or ex partner: Not on file    Emotionally abused: Not on file    Physically abused: Not on file    Forced sexual activity: Not on file  Other Topics Concern  . Not on file  Social History Narrative  . Not on file   Family History  Problem Relation Age of Onset  . Cancer Other   . Diabetes Other       VITAL SIGNS There were no vitals taken  for this visit.  Outpatient Encounter Medications as of 07/06/2018  Medication Sig  . acetaminophen (TYLENOL) 500 MG tablet Take 1,000 mg by mouth every 8 (eight) hours.   . budesonide (PULMICORT) 0.5 MG/2ML nebulizer solution Take 2 mLs (0.5 mg total) by nebulization 2 (two) times daily.  Marland Kitchen docusate sodium (COLACE) 100 MG capsule Take 100 mg by mouth 2 (two) times daily.  . ferrous sulfate 325 (65 FE) MG EC tablet Take 325 mg by mouth daily with breakfast.  . furosemide (LASIX) 40 MG tablet Take 1 tablet (40 mg total) by mouth 2 (two) times daily.  . Glucerna (GLUCERNA) LIQD Take 237 mLs by mouth daily.  Marland Kitchen HYDROcodone-acetaminophen (NORCO) 5-325 MG tablet Take 1 tablet by mouth every 4 (four) hours as needed for up to 7 days for moderate pain.  Marland Kitchen ipratropium-albuterol (DUONEB) 0.5-2.5 (3) MG/3ML SOLN Take 3 mLs by nebulization every 8 (eight) hours.  . NON FORMULARY Diet Type:  D3 with nectar thickened liquids  . omeprazole (PRILOSEC) 20 MG capsule Take 20 mg by mouth daily.   . OXYGEN Inhale 2 L/min into the lungs continuous.  . potassium chloride SA (K-DUR,KLOR-CON) 20 MEQ tablet Take 2 tablets (40 mEq total) by mouth daily.  . sennosides-docusate sodium (SENOKOT-S) 8.6-50 MG tablet Take 1 tablet by mouth 2 (two) times daily.   No facility-administered encounter medications on file as of 07/06/2018.      SIGNIFICANT DIAGNOSTIC EXAMS  REVIEWED PREVIOUS:   06-20-18: 2-d echo:  1. The left ventricle has normal systolic function with an ejection fraction of 60-65%. The cavity size was normal. There is mildly increased left ventricular wall thickness. Left ventricular diastolic Doppler parameters are consistent with impaired  relaxation.  2. The right ventricle has normal systolic function. The cavity was normal. There is no increase in right ventricular wall thickness. Right ventricular systolic pressure is mildly elevated with an estimated pressure of 39.7 mmHg.  3. The mitral valve is normal  in structure. Mild thickening of the mitral valve leaflet. There is mild mitral annular calcification present.  4. The tricuspid valve is normal in structure.  5. The aortic valve is tricuspid Mild thickening of the aortic valve.  6. The pulmonic valve was normal in structure.  7. Normal LV systolic function; mild diastolic dysfunction; mild LVH; mild TR; mild pulmonary hypertension.  06-20-18: chest x-ray: New right lower lobe opacity and small right pleural effusion, suspicious for pneumonia. Recommend chest radiographic follow-up to ensure resolution.  06-23-18: chest x-ray: Increasing  right basilar consolidation and effusion.  NO NEW EXAMS   LABS REVIEWED PREVIOUS:   04-30-18: wbc 6.6; hgb 10.5; hct 33.1; mcv 102.2; plt 150; glucose 163; bun 22; creat 1.20; k+ 4.5; an++ 140; ca 8.8; liver normal albumin 3.4  05-31-18: wbc 4.8; hgb 8.9; hct 30.3; mcv 104.5 plt 159; glucose 90; bun 26; creat 0.62; k+ 4.2; na++ 139; ca 8.3  06-20-18: wbc 7.1; hgb 9.2; hct 31.3; mcv 104.3; plt  163 glucose 125; bun 24; creat 0.58; k+ 4.0; na++ 138; ca 8.6 liver normal albumin 2.7 BNP 434.0 06-22-18: wbc 3.0; hgb 9.4; hct 31.2; mcv 103.7 plt 154 glucose 172; bun 30; creat 0.85; k+ 3.9; na++ 145 ca 8.6 mag 1.8; folate  14.0; vit B 12: 449  07/07/2018: glucose 104; bun 41; creat 0.69; k+ 3.2; na++ 139; ca 8.5   TODAY;   07-05-18: wbc 8.5; hgb 8.7; hct 28.7; mcv 103.6; plt 140; glucose 90; bun 41; creat 0.82; k+ 4.2; na++ 139; ca 8.6; liver normal albumin 2.7   Review of Systems  Constitutional: Negative for malaise/fatigue.  Respiratory: Negative for cough and shortness of breath.   Cardiovascular: Negative for chest pain and leg swelling.  Gastrointestinal: Negative for abdominal pain.  Musculoskeletal: Positive for joint pain and myalgias. Negative for back pain.       Hurt all over  Skin: Negative.   Neurological: Negative for dizziness.  Psychiatric/Behavioral: The patient is not nervous/anxious.      Physical Exam Constitutional:      General: She is not in acute distress.    Appearance: She is well-developed. She is not diaphoretic.  Neck:     Thyroid: No thyromegaly.  Cardiovascular:     Rate and Rhythm: Normal rate and regular rhythm.     Pulses: Normal pulses.     Heart sounds: Normal heart sounds.  Pulmonary:     Effort: Pulmonary effort is normal. No respiratory distress.     Breath sounds: Normal breath sounds.     Comments: 02 dependent  Abdominal:     General: Bowel sounds are normal. There is no distension.     Palpations: Abdomen is soft.     Tenderness: There is no abdominal tenderness.  Musculoskeletal: Normal range of motion.     Right lower leg: Edema present.     Left lower leg: Edema present.     Comments: able to move all extremities 2+ bilateral lower extremity edema Is status post left hip fracture    Lymphadenopathy:     Cervical: No cervical adenopathy.  Skin:    General: Skin is warm and dry.  Neurological:     Mental Status: She is alert. Mental status is at baseline.  Psychiatric:        Mood and Affect: Mood normal.      ASSESSMENT/ PLAN:  TODAY ;  1. Closed left hip fracture, sequela/ chronic pain of muliple joints: is worse: will change her to vicodin 5/325 mg every 6 hours routinely for pain management.   2. CKD (chronic kidney disease) stage III: is without change bun 41 creat 0.82 will monitor   3. Chronic respiratory failure with hypoxia: is stable is 02 dependent will continue pulmicort 0.5 mg neb twice daily and duoneb every 8 hours.   PREVIOUS  4. Acute on chronic diastolic CHF (congestive heart failure): EF 60-65% (06-20-18): is stable will continue lasix 40 mg twice daily with k + 40 meq twice daily   5. Chronic anemia: is stable  hgb 8.7; will continue iron daily will monitor  6. Chronic constipation: is stable will continue colace twice daily    7. GERD without esophagitis: is stable will continue prilosec 20 mg daily    8. Dysphagia oropharyngeal: is stable; no further signs of aspiration present: will continue nectar thick liquids; she does not like them.   9. Protein calorie malnutrition, severe: is without change albumin 2.7 will continue glucerna daily    MD is aware of resident's narcotic use and is in agreement with current plan of care. We will attempt to wean resident as apropriate   Ok Edwards NP [facility]  Contact [phone] Monday through Friday 8am- 5pm  After hours call [phone]

## 2018-07-07 ENCOUNTER — Non-Acute Institutional Stay (SKILLED_NURSING_FACILITY): Payer: Medicare HMO | Admitting: Adult Health

## 2018-07-07 ENCOUNTER — Encounter: Payer: Self-pay | Admitting: Adult Health

## 2018-07-07 ENCOUNTER — Other Ambulatory Visit: Payer: Self-pay | Admitting: Adult Health

## 2018-07-07 DIAGNOSIS — J9611 Chronic respiratory failure with hypoxia: Secondary | ICD-10-CM

## 2018-07-07 DIAGNOSIS — S72002S Fracture of unspecified part of neck of left femur, sequela: Secondary | ICD-10-CM | POA: Diagnosis not present

## 2018-07-07 DIAGNOSIS — R627 Adult failure to thrive: Secondary | ICD-10-CM

## 2018-07-07 MED ORDER — MORPHINE SULFATE (CONCENTRATE) 20 MG/ML PO SOLN: 5.0000 mg | ORAL | 0 refills | Status: AC

## 2018-07-07 MED ORDER — LORAZEPAM 0.5 MG PO TABS: 0.5000 mg | ORAL_TABLET | Freq: Four times a day (QID) | ORAL | 0 refills | Status: DC | PRN

## 2018-07-07 NOTE — Progress Notes (Signed)
Location:   [facility] Room Number: 103 P Place of Service:  SNF (31)   CODE STATUS: DNR  Allergies  Allergen Reactions  . Aspirin     Stomach problem  . Codeine     hallucinate  . Penicillins Rash    Has patient had a PCN reaction causing immediate rash, facial/tongue/throat swelling, SOB or lightheadedness with hypotension: Yes Has patient had a PCN reaction causing severe rash involving mucus membranes or skin necrosis: No Has patient had a PCN reaction that required hospitalization No Has patient had a PCN reaction occurring within the last 10 years: No If all of the above answers are "NO", then may proceed with Cephalosporin use.     Chief Complaint  Patient presents with  . Acute Visit    Pain Management    HPI:  She is having uncontrolled pain. She is yelling out she is restless is unable to get comfortable. Her po intake is poor. There are no reports of fevers present.   Past Medical History:  Diagnosis Date  . Chronic anemia 05/01/2018  . Chronic diastolic heart failure ([place]) 06/12/2018  . CKD (chronic kidney disease), stage III ([place]) 05/01/2018  . Closed fracture of olecranon process of ulna 11/07/2009   Qualifier: Diagnosis of  By: Danne Harbor, Chasity L   . Constipation 05/01/2018  . CTS (carpal tunnel syndrome) 03/04/2011  . GERD (gastroesophageal reflux disease)   . RUPTURE ROTATOR CUFF 03/19/2010   Qualifier: Diagnosis of  By: Aline Brochure MD, Dorothyann Peng      Past Surgical History:  Procedure Laterality Date  . APPENDECTOMY     85 yr old  . BREAST LUMPECTOMY    . CATARACT EXTRACTION    . ELBOW SURGERY    . HEMORROIDECTOMY    . HERNIA REPAIR    . INTRAMEDULLARY (IM) NAIL INTERTROCHANTERIC Left 05/01/2018   Procedure: LEFT BIOMET AFFIXUS (IM) SHORT NAIL, INTERTROCHANTRIC FRACTURE;  Surgeon: [name], MD;  Location: [facility];  Service: Orthopedics;  Laterality: Left;  . SHOULDER SURGERY     left rotator cuff  . TONSILLECTOMY  AND ADENOIDECTOMY     11 yr  . tubal removal      Social History   Socioeconomic History  . Marital status: Widowed    Spouse name: Not on file  . Number of children: Not on file  . Years of education: Not on file  . Highest education level: Not on file  Occupational History  . Not on file  Social Needs  . Financial resource strain: Not on file  . Food insecurity:    Worry: Not on file    Inability: Not on file  . Transportation needs:    Medical: Not on file    Non-medical: Not on file  Tobacco Use  . Smoking status: Never Smoker  . Smokeless tobacco: Never Used  Substance and Sexual Activity  . Alcohol use: No  . Drug use: Not on file  . Sexual activity: Not on file  Lifestyle  . Physical activity:    Days per week: Not on file    Minutes per session: Not on file  . Stress: Not on file  Relationships  . Social connections:    Talks on phone: Not on file    Gets together: Not on file    Attends religious service: Not on file    Active member of club or organization: Not on file    Attends meetings of clubs or  organizations: Not on file    Relationship status: Not on file  . Intimate partner violence:    Fear of current or ex partner: Not on file    Emotionally abused: Not on file    Physically abused: Not on file    Forced sexual activity: Not on file  Other Topics Concern  . Not on file  Social History Narrative  . Not on file   Family History  Problem Relation Age of Onset  . Cancer Other   . Diabetes Other       VITAL SIGNS BP (!) 93/53   Pulse 87   Temp 98.7 F (37.1 C)   Resp 17   Ht 5\' 3"  (1.6 m)   Wt 152 lb 9.6 oz (69.2 kg)   SpO2 94%   BMI 27.03 kg/m   Outpatient Encounter Medications as of 07/07/2018  Medication Sig  . acetaminophen (TYLENOL) 500 MG tablet Take 1,000 mg by mouth every 8 (eight) hours.   Roseanne Kaufman [place]-Castor Oil (VENELEX) OINT Apply to sacrum and bilateral buttocks qhsift & prn for prevention. Every Shift  .  budesonide (PULMICORT) 0.5 MG/2ML nebulizer solution Take 2 mLs (0.5 mg total) by nebulization 2 (two) times daily.  Marland Kitchen docusate sodium (COLACE) 100 MG capsule Take 100 mg by mouth 2 (two) times daily.  . furosemide (LASIX) 40 MG tablet Take 1 tablet (40 mg total) by mouth 2 (two) times daily.  . Glucerna (GLUCERNA) LIQD Take 237 mLs by mouth daily.  Marland Kitchen ipratropium-albuterol (DUONEB) 0.5-2.5 (3) MG/3ML SOLN Take 3 mLs by nebulization every 8 (eight) hours.  Marland Kitchen LORazepam (ATIVAN) 0.5 MG tablet Take 1 tablet (0.5 mg total) by mouth every 6 (six) hours as needed for up to 14 days for anxiety.  Marland Kitchen morphine (ROXANOL) 20 MG/ML concentrated solution Take 0.25 mLs (5 mg total) by mouth every 4 (four) hours. AND every 2 hours as needed for breakthrough pain  . NON FORMULARY Diet Type:  D3 with nectar thickened liquids  . omeprazole (PRILOSEC) 20 MG capsule Take 20 mg by mouth daily.   . OXYGEN Inhale 2 L/min into the lungs continuous.  . potassium chloride SA (K-DUR,KLOR-CON) 20 MEQ tablet Take 2 tablets (40 mEq total) by mouth daily.  . sennosides-docusate sodium (SENOKOT-S) 8.6-50 MG tablet Take 1 tablet by mouth 2 (two) times daily.  . Wound Dressings (ALLEVYN ADHESIVE) PADS Apply Allevyn foam dressing to bilateral heels and change q3 days and prn for prevention once every 3 days   No facility-administered encounter medications on file as of 07/07/2018.      SIGNIFICANT DIAGNOSTIC EXAMS   REVIEWED PREVIOUS:   06-20-18: 2-d echo:  1. The left ventricle has normal systolic function with an ejection fraction of 60-65%. The cavity size was normal. There is mildly increased left ventricular wall thickness. Left ventricular diastolic Doppler parameters are consistent with impaired  relaxation.  2. The right ventricle has normal systolic function. The cavity was normal. There is no increase in right ventricular wall thickness. Right ventricular systolic pressure is mildly elevated with an estimated pressure of  39.7 mmHg.  3. The mitral valve is normal in structure. Mild thickening of the mitral valve leaflet. There is mild mitral annular calcification present.  4. The tricuspid valve is normal in structure.  5. The aortic valve is tricuspid Mild thickening of the aortic valve.  6. The pulmonic valve was normal in structure.  7. Normal LV systolic function; mild diastolic dysfunction; mild LVH; mild TR;  mild pulmonary hypertension.  06-20-18: chest x-ray: New right lower lobe opacity and small right pleural effusion, suspicious for pneumonia. Recommend chest radiographic follow-up to ensure resolution.  06-23-18: chest x-ray: Increasing right basilar consolidation and effusion.  NO NEW EXAMS   LABS REVIEWED PREVIOUS:   04-30-18: wbc 6.6; hgb 10.5; hct 33.1; mcv 102.2; plt 150; glucose 163; bun 22; creat 1.20; k+ 4.5; an++ 140; ca 8.8; liver normal albumin 3.4  05-31-18: wbc 4.8; hgb 8.9; hct 30.3; mcv 104.5 plt 159; glucose 90; bun 26; creat 0.62; k+ 4.2; na++ 139; ca 8.3  06-20-18: wbc 7.1; hgb 9.2; hct 31.3; mcv 104.3; plt  163 glucose 125; bun 24; creat 0.58; k+ 4.0; na++ 138; ca 8.6 liver normal albumin 2.7 BNP 434.0 06-22-18: wbc 3.0; hgb 9.4; hct 31.2; mcv 103.7 plt 154 glucose 172; bun 30; creat 0.85; k+ 3.9; na++ 145 ca 8.6 mag 1.8; folate  14.0; vit B 12: 449  07/09/2018: glucose 104; bun 41; creat 0.69; k+ 3.2; na++ 139; ca 8.5   TODAY;   07-05-18: wbc 8.5; hgb 8.7; hct 28.7; mcv 103.6; plt 140; glucose 90; bun 41; creat 0.82; k+ 4.2; na++ 139; ca 8.6; liver normal albumin 2.7  Review of Systems  Reason unable to perform ROS: unable to participate      Physical Exam Constitutional:      General: She is in acute distress.     Appearance: She is well-developed. She is not diaphoretic.  Neck:     Musculoskeletal: Neck supple.     Thyroid: No thyromegaly.  Cardiovascular:     Rate and Rhythm: Normal rate and regular rhythm.     Pulses: Normal pulses.     Heart sounds: Normal heart sounds.   Pulmonary:     Effort: Pulmonary effort is normal. No respiratory distress.     Breath sounds: Normal breath sounds.     Comments: 02 dependent  Abdominal:     General: Bowel sounds are normal. There is no distension.     Palpations: Abdomen is soft.     Tenderness: There is no abdominal tenderness.  Musculoskeletal:     Right lower leg: Edema present.     Left lower leg: Edema present.     Comments: able to move all extremities 2+ bilateral lower extremity edema Is status post left hip fracture     Lymphadenopathy:     Cervical: No cervical adenopathy.  Skin:    General: Skin is warm and dry.  Neurological:     Mental Status: She is alert. Mental status is at baseline.  Psychiatric:     Comments: Is restless       ASSESSMENT/ PLAN:  TODAY;   1. Chronic respiratory failure with hypoxia 2. Closed fracture of left hip sequela 3. Failure to thrive in adult  Will stop vicodin and iron  Will begin roxanol 5 mg every 4 hours and every 2 hours as needed Will begin ativan 0.5 mg every 6 hours as needed for anxiety   MD is aware of resident's narcotic use and is in agreement with current plan of care. We will attempt to wean resident as apropriate   Ok Edwards NP [facility]  Contact [phone] Monday through Friday 8am- 5pm  After hours call [phone]

## 2018-07-08 ENCOUNTER — Other Ambulatory Visit: Payer: Self-pay | Admitting: Adult Health

## 2018-07-09 ENCOUNTER — Encounter: Payer: Self-pay | Admitting: Adult Health

## 2018-07-09 ENCOUNTER — Other Ambulatory Visit: Payer: Self-pay | Admitting: Adult Health

## 2018-07-09 ENCOUNTER — Non-Acute Institutional Stay (SKILLED_NURSING_FACILITY): Payer: Medicare HMO | Admitting: Adult Health

## 2018-07-09 DIAGNOSIS — R627 Adult failure to thrive: Secondary | ICD-10-CM | POA: Diagnosis not present

## 2018-07-09 DIAGNOSIS — S72002S Fracture of unspecified part of neck of left femur, sequela: Secondary | ICD-10-CM

## 2018-07-09 DIAGNOSIS — J9611 Chronic respiratory failure with hypoxia: Secondary | ICD-10-CM

## 2018-07-09 MED ORDER — LORAZEPAM 0.5 MG PO TABS: 0.5000 mg | ORAL_TABLET | Freq: Every day | ORAL | 0 refills | Status: AC

## 2018-07-09 NOTE — Progress Notes (Signed)
Location:   [facility] Room Number: 103 P Place of Service:  SNF (31)   CODE STATUS: DNR  Allergies  Allergen Reactions  . Aspirin     Stomach problem  . Codeine     hallucinate  . Penicillins Rash    Has patient had a PCN reaction causing immediate rash, facial/tongue/throat swelling, SOB or lightheadedness with hypotension: Yes Has patient had a PCN reaction causing severe rash involving mucus membranes or skin necrosis: No Has patient had a PCN reaction that required hospitalization No Has patient had a PCN reaction occurring within the last 10 years: No If all of the above answers are "NO", then may proceed with Cephalosporin use.     Chief Complaint  Patient presents with  . Acute Visit    Pain Management    HPI:  Her pain is better managed. She is having increased episodes of anxiety which are worse in the AM. She is having less periods od yelling out. She is on lasix which needs to be stopped at this time due to her poor po intake. There are no reports of fevers present. She continue to be followed by hospice care.    Past Medical History:  Diagnosis Date  . Chronic anemia 05/01/2018  . Chronic diastolic heart failure ([place]) 06/12/2018  . CKD (chronic kidney disease), stage III ([place]) 05/01/2018  . Closed fracture of olecranon process of ulna 11/07/2009   Qualifier: Diagnosis of  By: Danne Harbor, Chasity L   . Constipation 05/01/2018  . CTS (carpal tunnel syndrome) 03/04/2011  . GERD (gastroesophageal reflux disease)   . RUPTURE ROTATOR CUFF 03/19/2010   Qualifier: Diagnosis of  By: Aline Brochure MD, Dorothyann Peng      Past Surgical History:  Procedure Laterality Date  . APPENDECTOMY     85 yr old  . BREAST LUMPECTOMY    . CATARACT EXTRACTION    . ELBOW SURGERY    . HEMORROIDECTOMY    . HERNIA REPAIR    . INTRAMEDULLARY (IM) NAIL INTERTROCHANTERIC Left 05/01/2018   Procedure: LEFT BIOMET AFFIXUS (IM) SHORT NAIL, INTERTROCHANTRIC FRACTURE;   Surgeon: [name], MD;  Location: [facility];  Service: Orthopedics;  Laterality: Left;  . SHOULDER SURGERY     left rotator cuff  . TONSILLECTOMY AND ADENOIDECTOMY     11 yr  . tubal removal      Social History   Socioeconomic History  . Marital status: Widowed    Spouse name: Not on file  . Number of children: Not on file  . Years of education: Not on file  . Highest education level: Not on file  Occupational History  . Not on file  Social Needs  . Financial resource strain: Not on file  . Food insecurity:    Worry: Not on file    Inability: Not on file  . Transportation needs:    Medical: Not on file    Non-medical: Not on file  Tobacco Use  . Smoking status: Never Smoker  . Smokeless tobacco: Never Used  Substance and Sexual Activity  . Alcohol use: No  . Drug use: Not on file  . Sexual activity: Not on file  Lifestyle  . Physical activity:    Days per week: Not on file    Minutes per session: Not on file  . Stress: Not on file  Relationships  . Social connections:    Talks on phone: Not on file    Gets together: Not  on file    Attends religious service: Not on file    Active member of club or organization: Not on file    Attends meetings of clubs or organizations: Not on file    Relationship status: Not on file  . Intimate partner violence:    Fear of current or ex partner: Not on file    Emotionally abused: Not on file    Physically abused: Not on file    Forced sexual activity: Not on file  Other Topics Concern  . Not on file  Social History Narrative  . Not on file   Family History  Problem Relation Age of Onset  . Cancer Other   . Diabetes Other       VITAL SIGNS BP 120/65   Pulse (!) 55   Temp 97.6 F (36.4 C)   Resp 20   Ht 5\' 3"  (1.6 m)   Wt 152 lb 9.6 oz (69.2 kg)   SpO2 90%   BMI 27.03 kg/m   Outpatient Encounter Medications as of 07/09/2018  Medication Sig  . acetaminophen (TYLENOL) 500 MG tablet Take 1,000 mg by mouth every 8  (eight) hours.   Roseanne Kaufman [place]-Castor Oil (VENELEX) OINT Apply to sacrum and bilateral buttocks qhsift & prn for prevention. Every Shift  . budesonide (PULMICORT) 0.5 MG/2ML nebulizer solution Take 2 mLs (0.5 mg total) by nebulization 2 (two) times daily.  Marland Kitchen docusate sodium (COLACE) 100 MG capsule Take 100 mg by mouth 2 (two) times daily.  . furosemide (LASIX) 40 MG tablet Take 1 tablet (40 mg total) by mouth 2 (two) times daily.  . Glucerna (GLUCERNA) LIQD Take 237 mLs by mouth daily.  Marland Kitchen ipratropium-albuterol (DUONEB) 0.5-2.5 (3) MG/3ML SOLN Take 3 mLs by nebulization every 8 (eight) hours.  Marland Kitchen LORazepam (ATIVAN) 0.5 MG tablet Take 1 tablet (0.5 mg total) by mouth every 6 (six) hours as needed for up to 14 days for anxiety.  Marland Kitchen morphine (ROXANOL) 20 MG/ML concentrated solution Take 0.25 mLs (5 mg total) by mouth every 4 (four) hours. AND every 2 hours as needed for breakthrough pain  . NON FORMULARY Diet Type:  D3 with nectar thickened liquids  . omeprazole (PRILOSEC) 20 MG capsule Take 20 mg by mouth daily.   . OXYGEN Inhale 2 L/min into the lungs continuous.  . potassium chloride SA (K-DUR,KLOR-CON) 20 MEQ tablet Take 2 tablets (40 mEq total) by mouth daily.  . sennosides-docusate sodium (SENOKOT-S) 8.6-50 MG tablet Take 1 tablet by mouth 2 (two) times daily.  . Wound Dressings (ALLEVYN ADHESIVE) PADS Apply Allevyn foam dressing to bilateral heels and change q3 days and prn for prevention once every 3 days   No facility-administered encounter medications on file as of 07/09/2018.      SIGNIFICANT DIAGNOSTIC EXAMS   REVIEWED PREVIOUS:   06-20-18: 2-d echo:  1. The left ventricle has normal systolic function with an ejection fraction of 60-65%. The cavity size was normal. There is mildly increased left ventricular wall thickness. Left ventricular diastolic Doppler parameters are consistent with impaired  relaxation.  2. The right ventricle has normal systolic function. The cavity was normal.  There is no increase in right ventricular wall thickness. Right ventricular systolic pressure is mildly elevated with an estimated pressure of 39.7 mmHg.  3. The mitral valve is normal in structure. Mild thickening of the mitral valve leaflet. There is mild mitral annular calcification present.  4. The tricuspid valve is normal in structure.  5. The aortic  valve is tricuspid Mild thickening of the aortic valve.  6. The pulmonic valve was normal in structure.  7. Normal LV systolic function; mild diastolic dysfunction; mild LVH; mild TR; mild pulmonary hypertension.  06-20-18: chest x-ray: New right lower lobe opacity and small right pleural effusion, suspicious for pneumonia. Recommend chest radiographic follow-up to ensure resolution.  06-23-18: chest x-ray: Increasing right basilar consolidation and effusion.  NO NEW EXAMS   LABS REVIEWED PREVIOUS:   04-30-18: wbc 6.6; hgb 10.5; hct 33.1; mcv 102.2; plt 150; glucose 163; bun 22; creat 1.20; k+ 4.5; an++ 140; ca 8.8; liver normal albumin 3.4  05-31-18: wbc 4.8; hgb 8.9; hct 30.3; mcv 104.5 plt 159; glucose 90; bun 26; creat 0.62; k+ 4.2; na++ 139; ca 8.3  06-20-18: wbc 7.1; hgb 9.2; hct 31.3; mcv 104.3; plt  163 glucose 125; bun 24; creat 0.58; k+ 4.0; na++ 138; ca 8.6 liver normal albumin 2.7 BNP 434.0 06-22-18: wbc 3.0; hgb 9.4; hct 31.2; mcv 103.7 plt 154 glucose 172; bun 30; creat 0.85; k+ 3.9; na++ 145 ca 8.6 mag 1.8; folate  14.0; vit B 12: 449  07/06/2018: glucose 104; bun 41; creat 0.69; k+ 3.2; na++ 139; ca 8.5   TODAY;   07-05-18: wbc 8.5; hgb 8.7; hct 28.7; mcv 103.6; plt 140; glucose 90; bun 41; creat 0.82; k+ 4.2; na++ 139; ca 8.6; liver normal albumin 2.7  Review of Systems  Reason unable to perform ROS: uanble to participate     Physical Exam Constitutional:      General: She is not in acute distress.    Appearance: She is well-developed. She is not diaphoretic.  Neck:     Musculoskeletal: Neck supple.     Thyroid: No  thyromegaly.  Cardiovascular:     Rate and Rhythm: Normal rate and regular rhythm.     Pulses: Normal pulses.     Heart sounds: Normal heart sounds.  Pulmonary:     Effort: Pulmonary effort is normal. No respiratory distress.     Breath sounds: Normal breath sounds.     Comments: 02 dependent  Abdominal:     General: Bowel sounds are normal. There is no distension.     Palpations: Abdomen is soft.     Tenderness: There is no abdominal tenderness.  Musculoskeletal:     Right lower leg: Edema present.     Left lower leg: Edema present.     Comments: able to move all extremities 2+ bilateral lower extremity edema Is status post left hip fracture      Lymphadenopathy:     Cervical: No cervical adenopathy.  Skin:    General: Skin is warm and dry.  Neurological:     Mental Status: She is alert. Mental status is at baseline.  Psychiatric:        Mood and Affect: Mood normal.        ASSESSMENT/ PLAN:  TODAY;   1. Chronic respiratory failure with hypoxia 2. Close fracture of left hip sequela  3. Failure to thrive in adult   Will stop lasix and k+  Will continue roxanol at 5 mg every 4 hours and every 2 hours as needed Will change ativan to 0.5 mg in the AM and will keep prn dosing.     MD is aware of resident's narcotic use and is in agreement with current plan of care. We will attempt to wean resident as apropriate   Ok Edwards NP [facility]  Contact [phone] Monday through Friday  8am- 5pm  After hours call [phone]

## 2018-07-12 DIAGNOSIS — R627 Adult failure to thrive: Secondary | ICD-10-CM | POA: Insufficient documentation

## 2018-07-21 DEATH — deceased

## 2020-01-30 IMAGING — DX DG KNEE COMPLETE 4+V*L*
1 series · 4 of 4 positions shown · non-contrast
Comparison: None.

CLINICAL DATA: Fall

EXAM:
LEFT KNEE - COMPLETE 4+ VIEW

[Series 1: knee · 0.14mm/px · 4 of 4 slices shown]
[im 1/4]
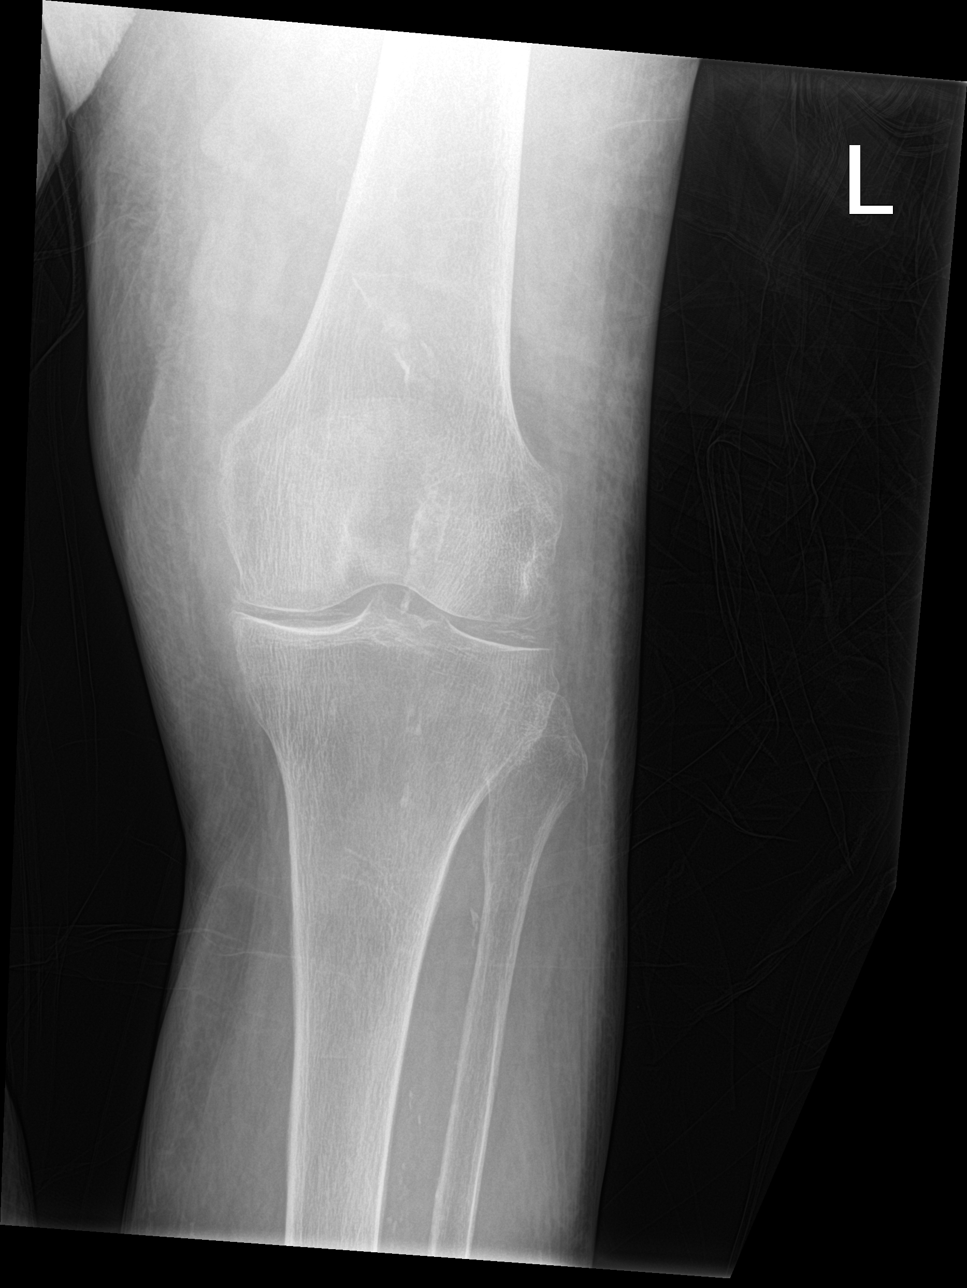
[im 2/4]
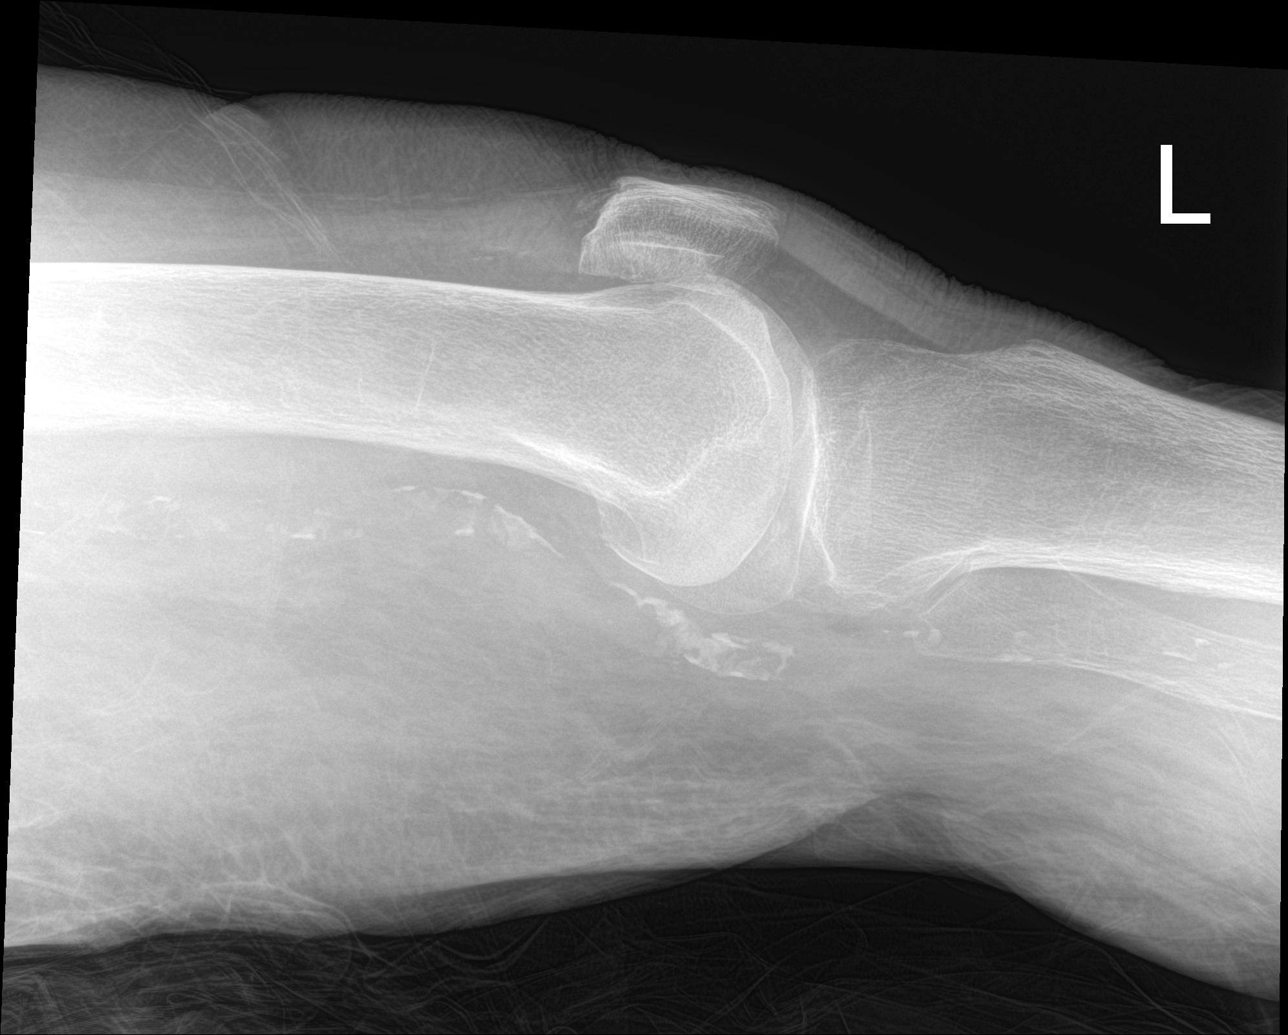
[im 3/4]
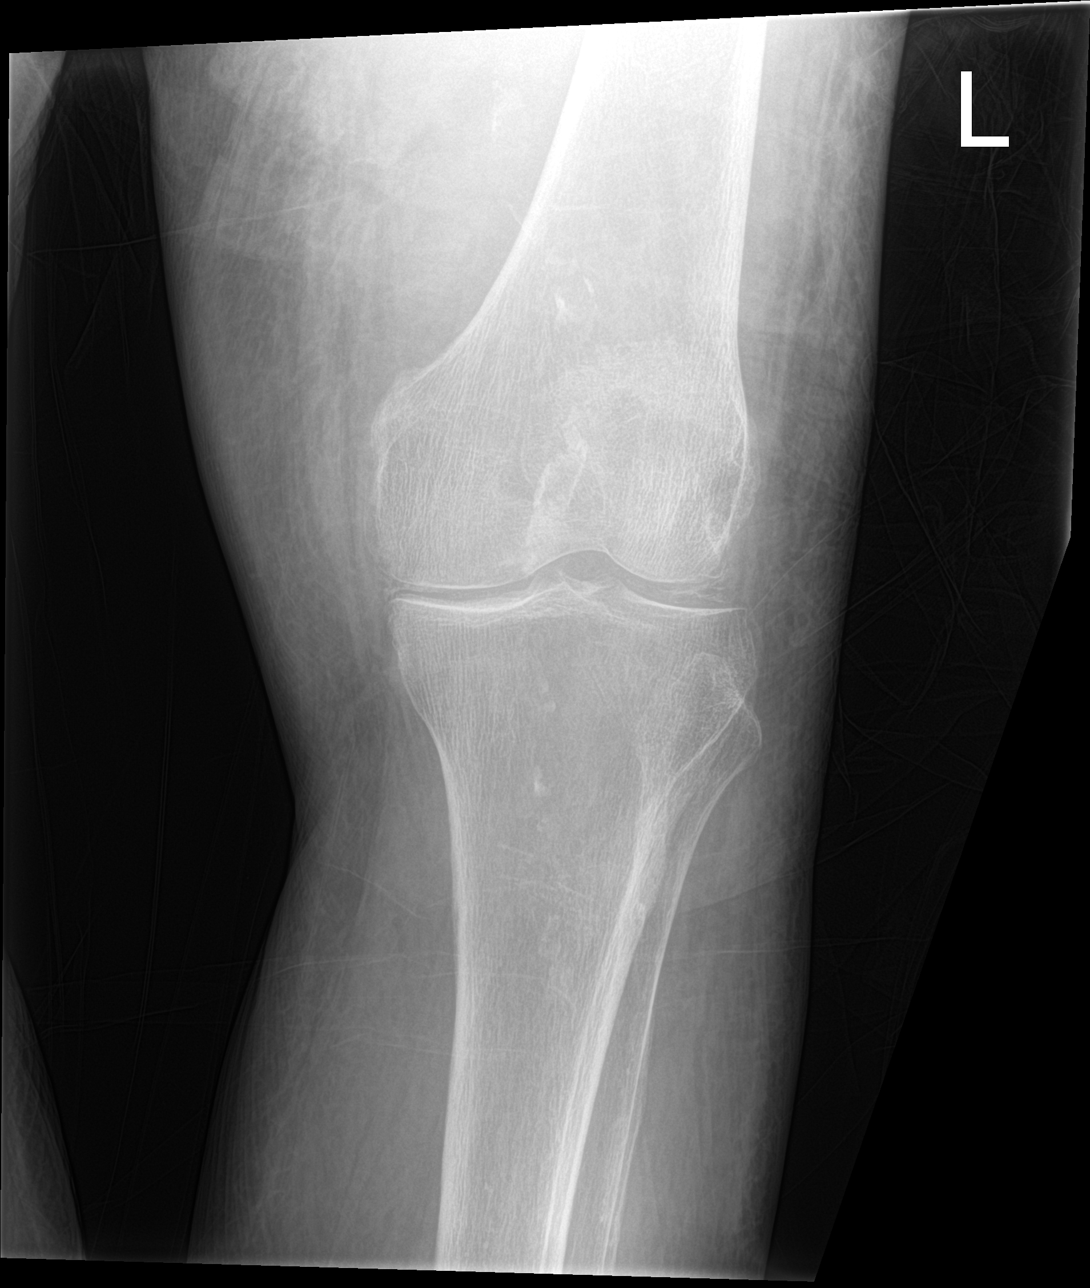
[im 4/4]
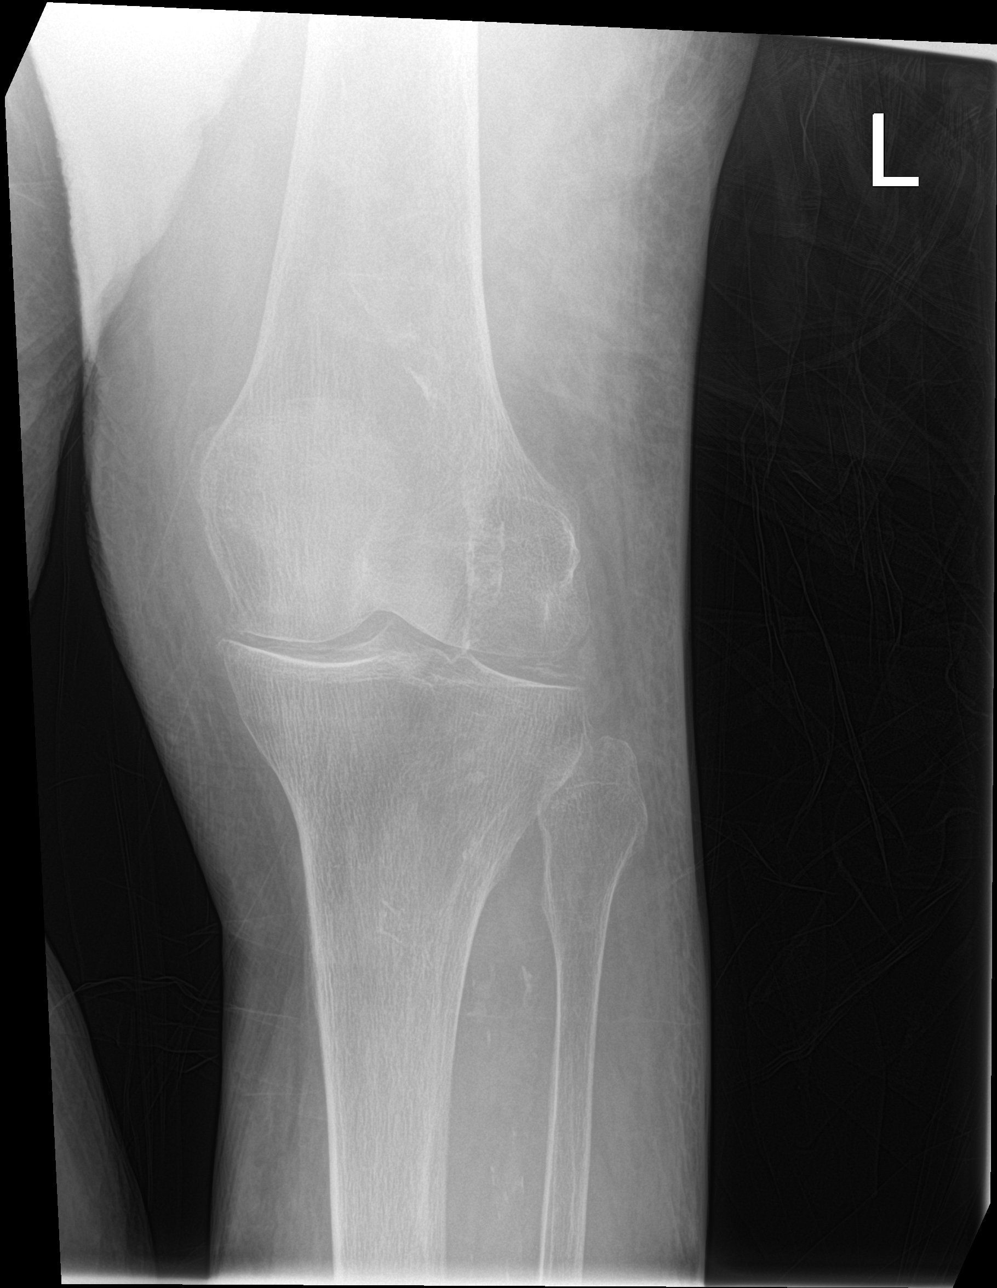

[4 of 4 positions shown; findings below may reference images not displayed]

FINDINGS: Bones appear osteopenic. Joint space calcifications. No fracture or
malalignment. Moderate degenerative changes of the medial and
lateral joint space with mild patellofemoral degenerative change. No
knee effusion. Vascular calcification
IMPRESSION: Degenerative changes without acute osseous abnormality

## 2020-01-30 IMAGING — DX DG ANKLE COMPLETE 3+V*L*
1 series · 3 of 3 positions shown · non-contrast
Comparison: None.

CLINICAL DATA: Fall with left-sided leg pain

EXAM:
LEFT ANKLE COMPLETE - 3+ VIEW

[Series 1: ankle · 0.14mm/px · 3 of 3 slices shown]
[im 1/3]
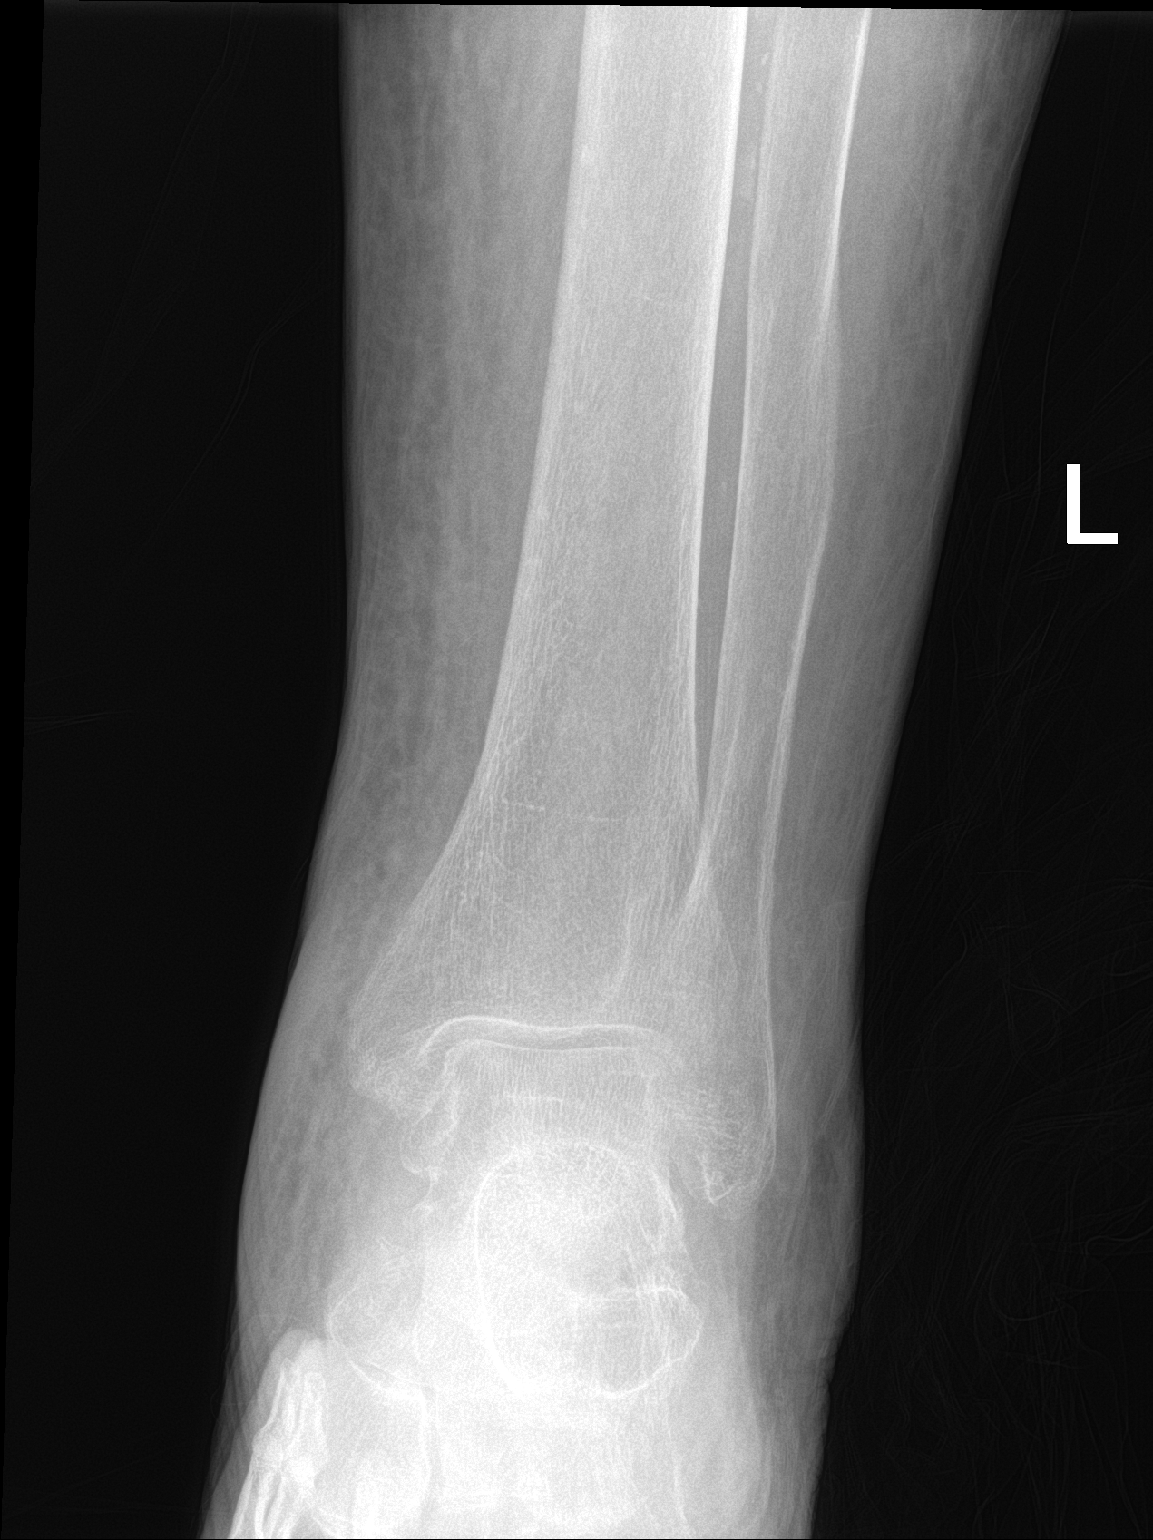
[im 2/3]
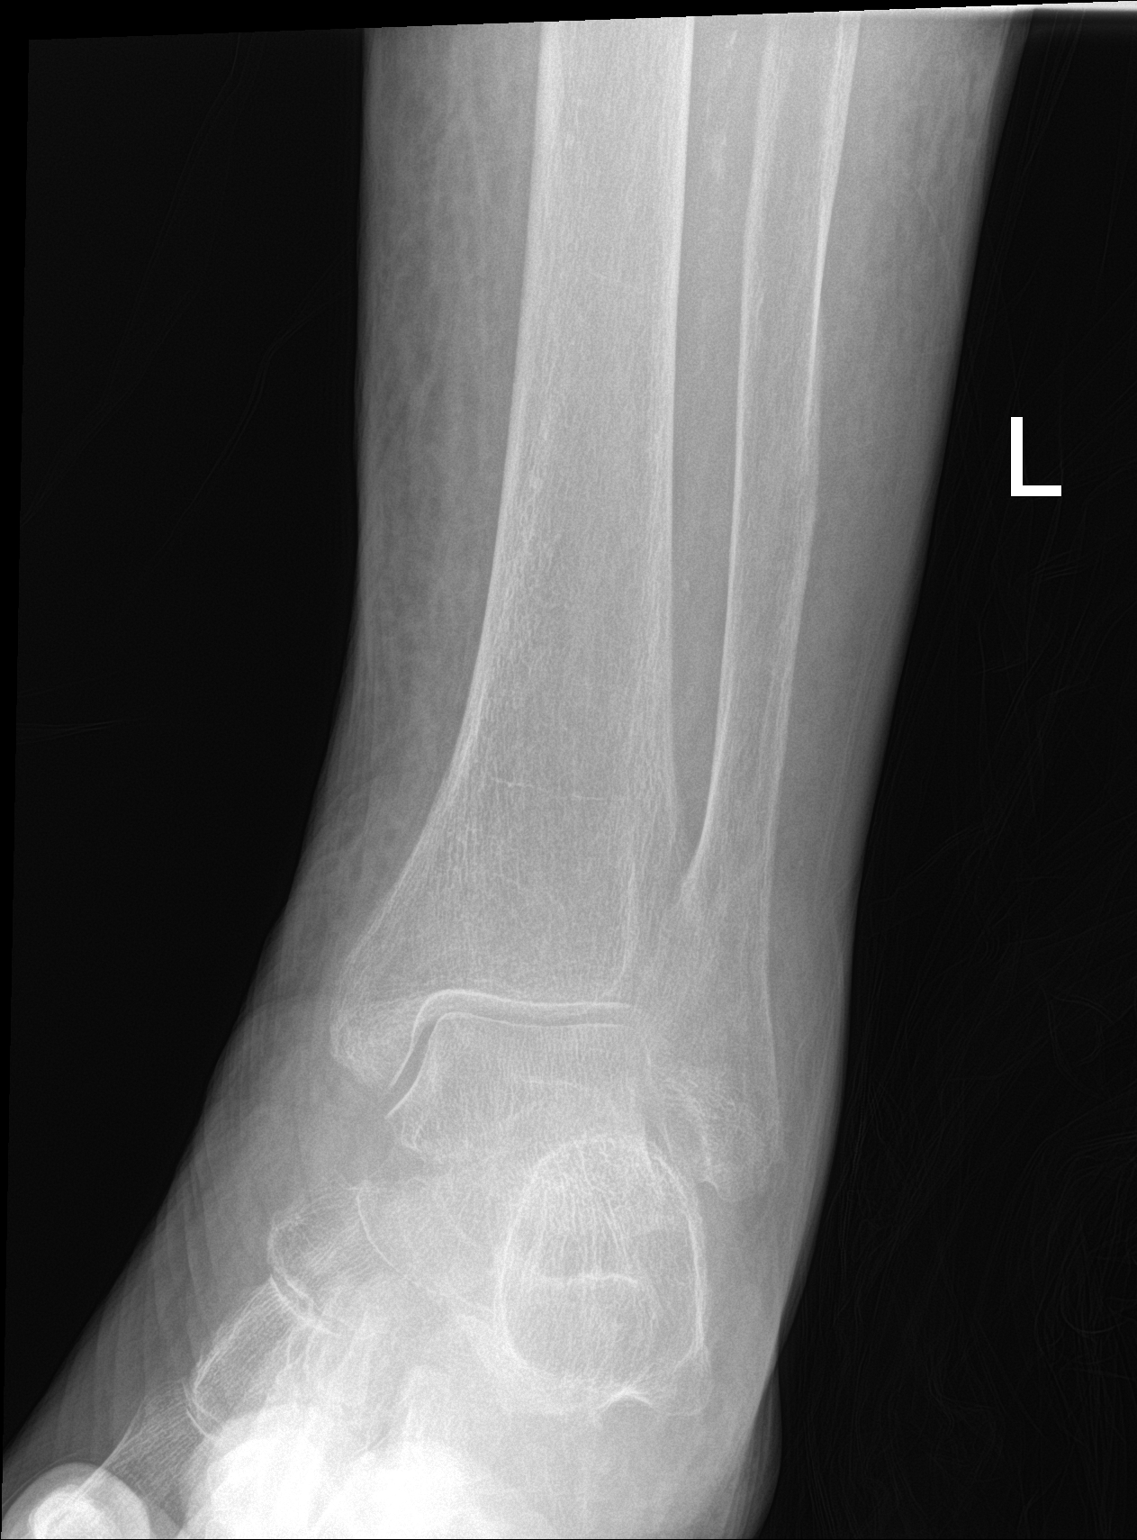
[im 3/3]
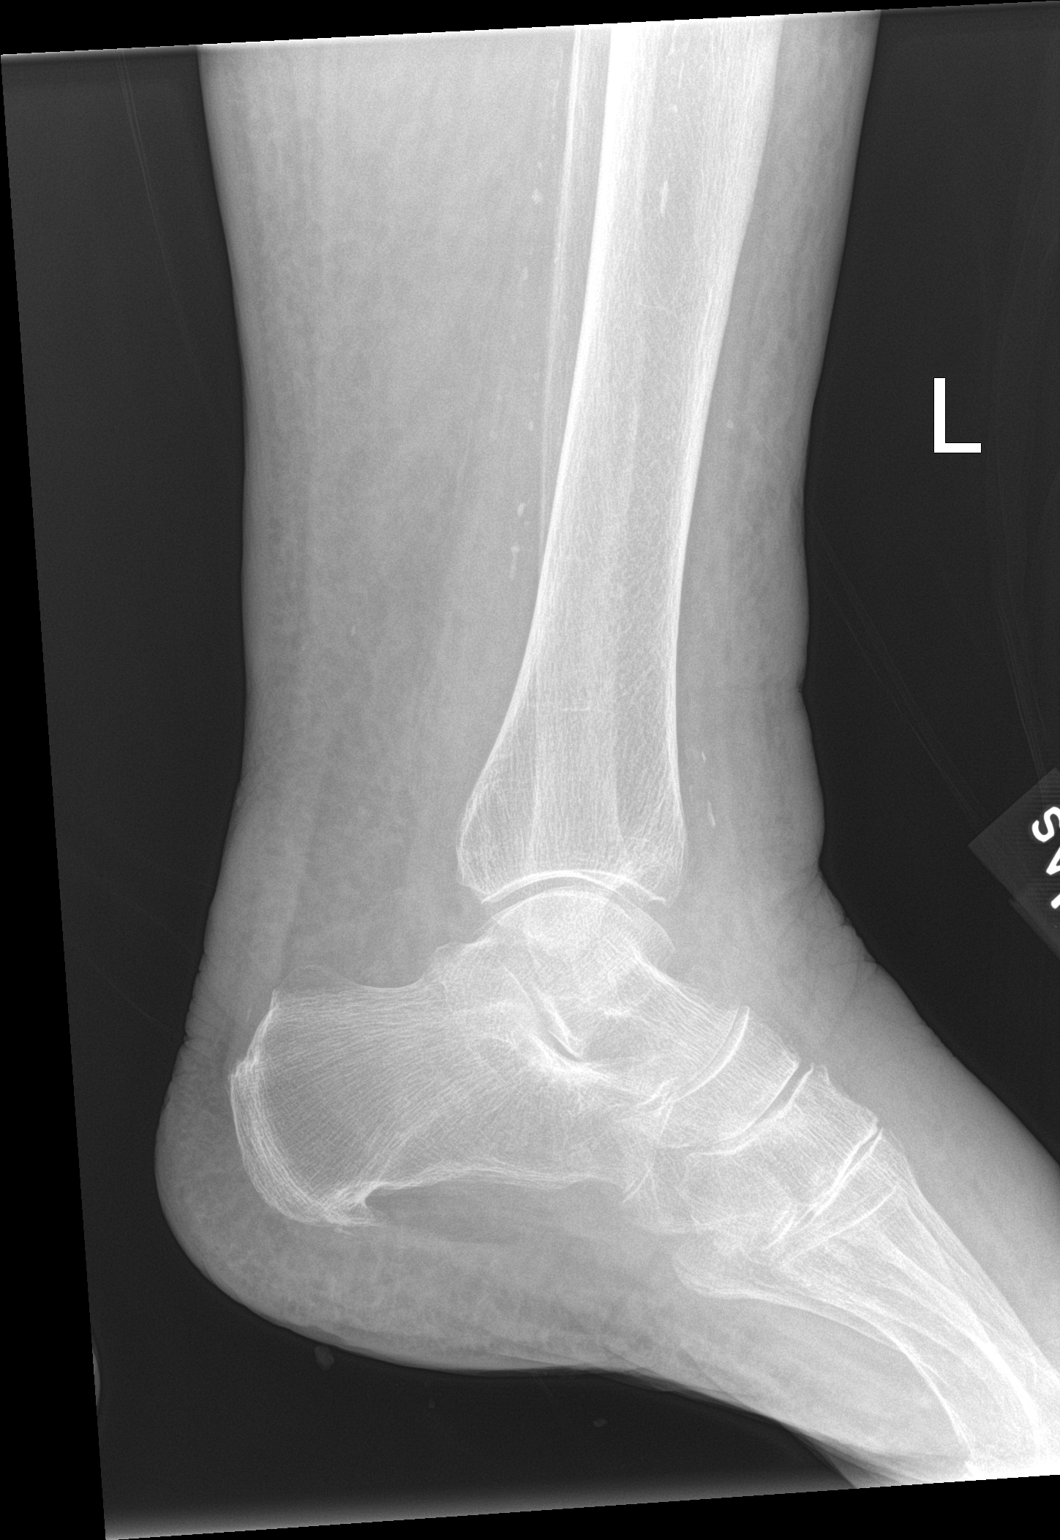

[3 of 3 positions shown; findings below may reference images not displayed]

FINDINGS: Diffuse soft tissue edema. Bones appear osteopenic. No fracture or
malalignment. Moderate plantar calcaneal spur.
IMPRESSION: No acute osseous abnormality.

## 2020-01-30 IMAGING — DX DG TIBIA/FIBULA 2V*L*
1 series · 2 of 2 positions shown · non-contrast
Comparison: None.

CLINICAL DATA: Fall

EXAM:
LEFT TIBIA AND FIBULA - 2 VIEW

[Series 1: leg · 0.14mm/px · 2 of 2 slices shown]
[im 1/2]
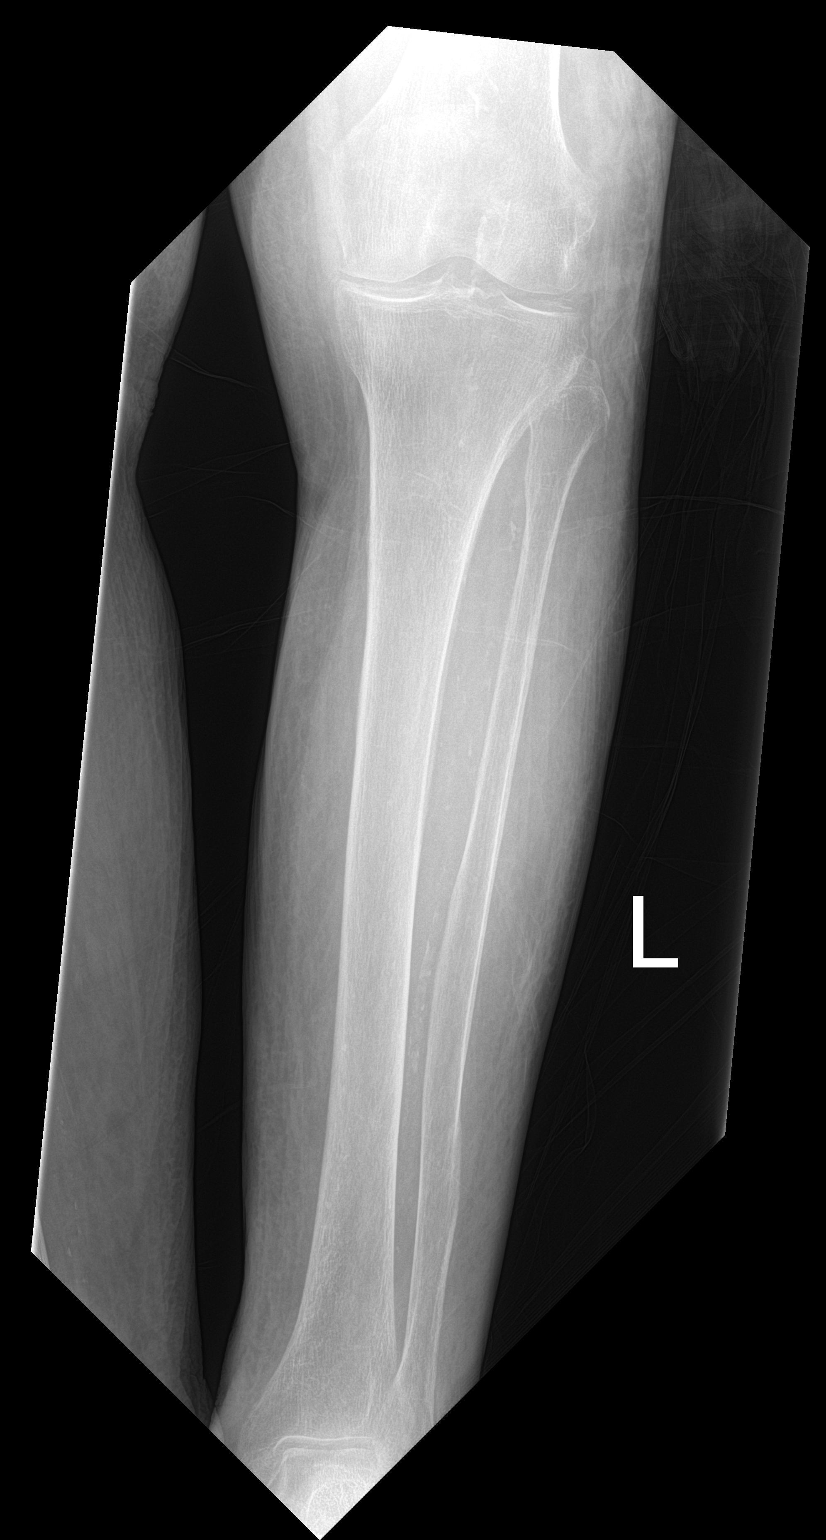
[im 2/2]
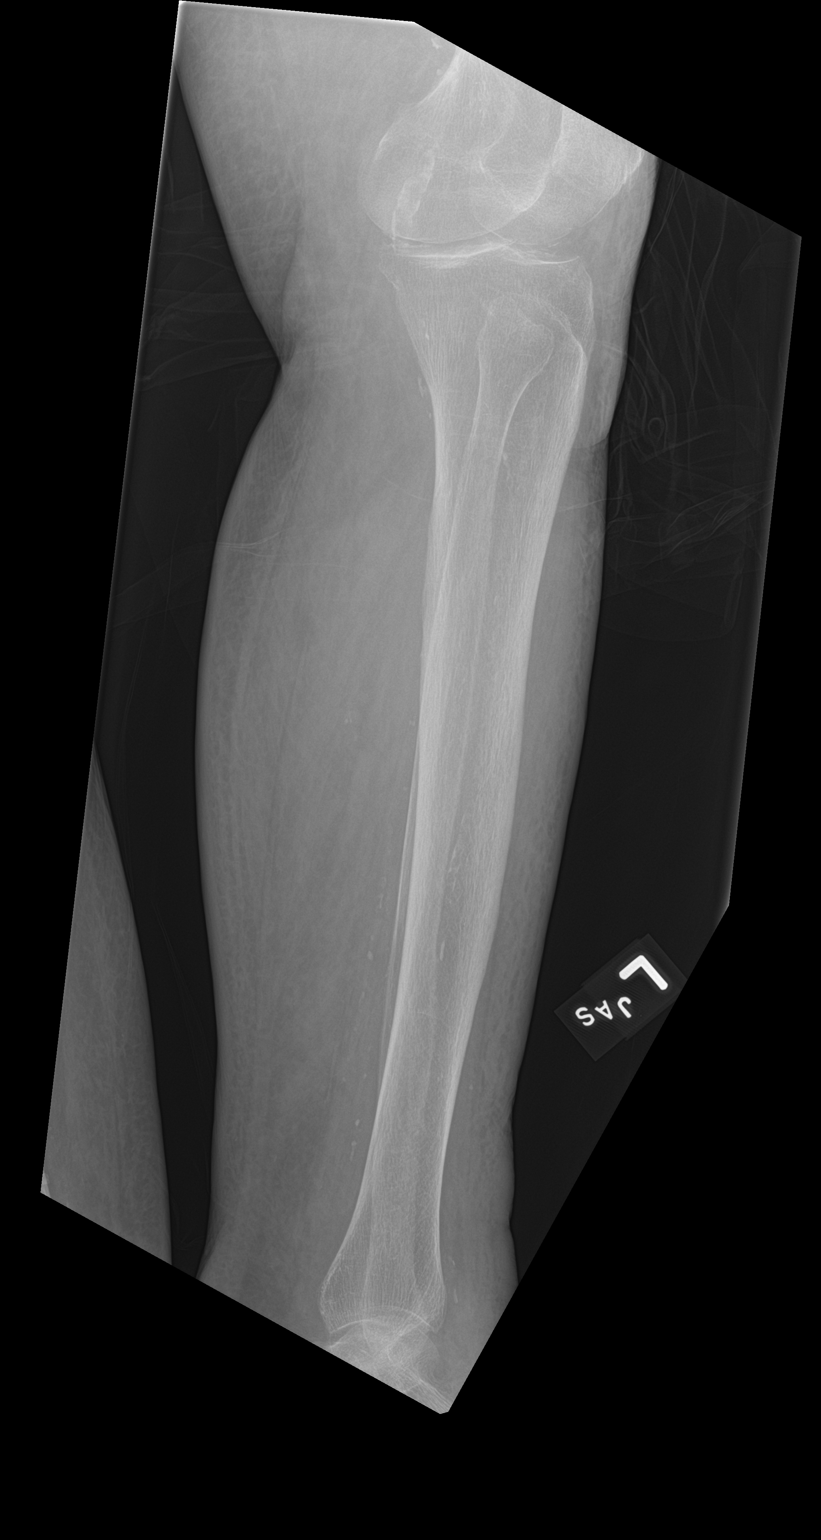

[2 of 2 positions shown; findings below may reference images not displayed]

FINDINGS: Joint space calcifications at the knee. No acute displaced fracture
or malalignment. Diffuse soft tissue swelling. Vascular
calcifications.
IMPRESSION: No acute osseous abnormality.  Chondrocalcinosis at the knee

## 2020-01-30 IMAGING — CT CT CERVICAL SPINE W/O CM
4 of 8 series · 10 of 33 positions shown, 11 images · non-contrast
Comparison: None.

CLINICAL DATA: Slipped and fell

EXAM:
CT HEAD WITHOUT CONTRAST
CT CERVICAL SPINE WITHOUT CONTRAST
TECHNIQUE: Multidetector CT imaging of the head and cervical spine was
performed following the standard protocol without intravenous
contrast. Multiplanar CT image reconstructions of the cervical spine
were also generated.

[Series 5: head bone · axial · 0.40mm/px · z∈[-97,-9]mm · 3 of 89 slices shown]
[im 23/89  bone]
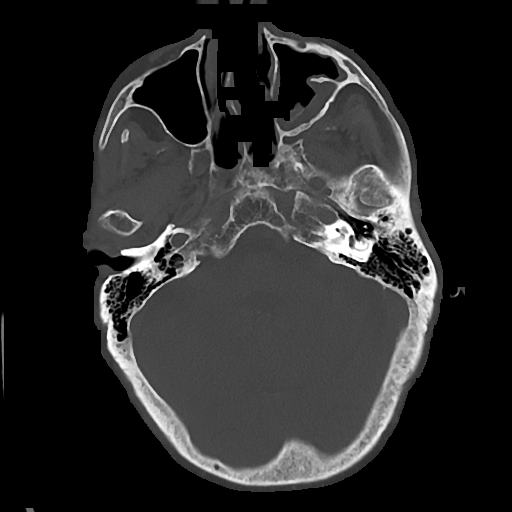
[im 45/89  bone]
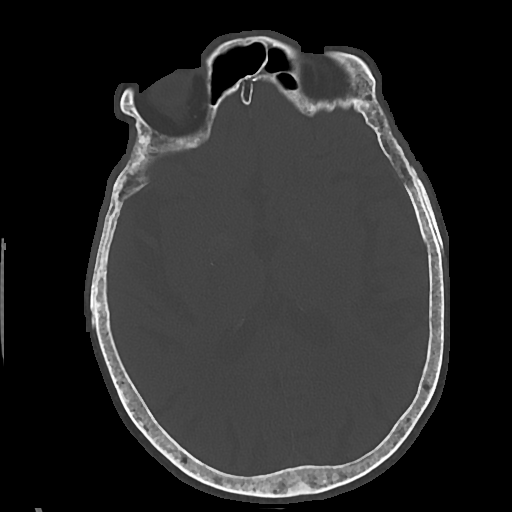
[im 67/89  bone]
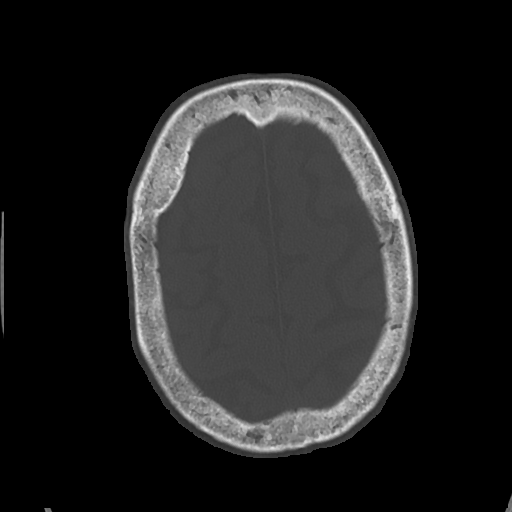

[Series 10: sag bone · sagittal · 0.23mm/px · 4 of 45 slices shown]
[im 9/45  bone]
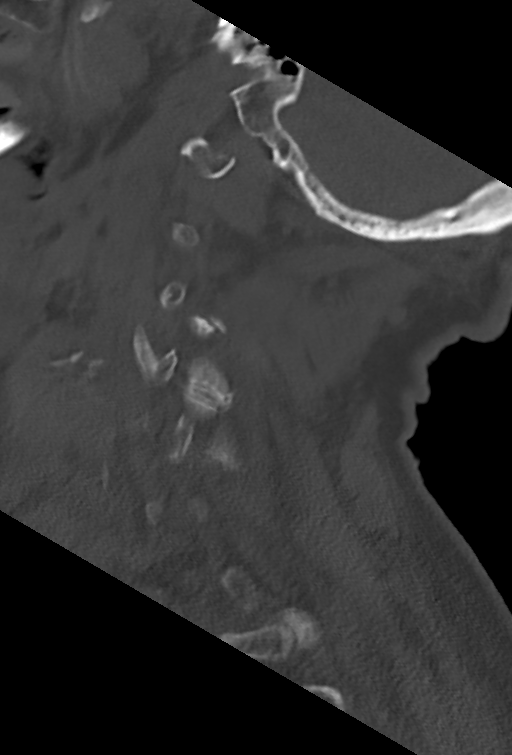
[im 18/45  bone]
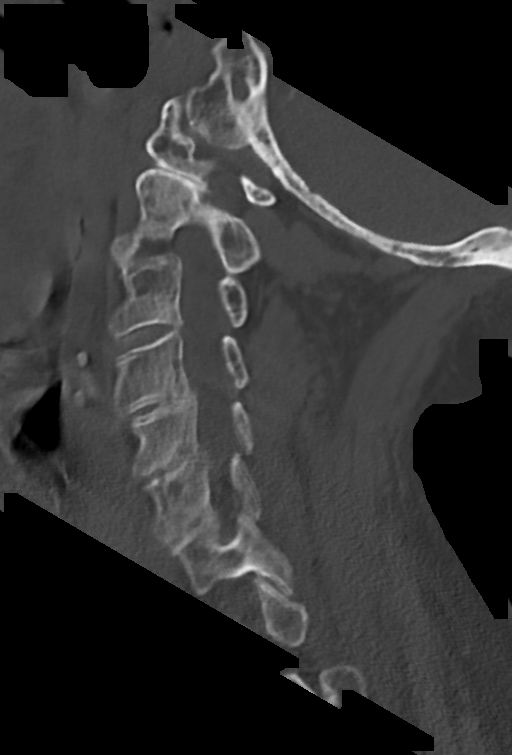
[im 27/45  bone]
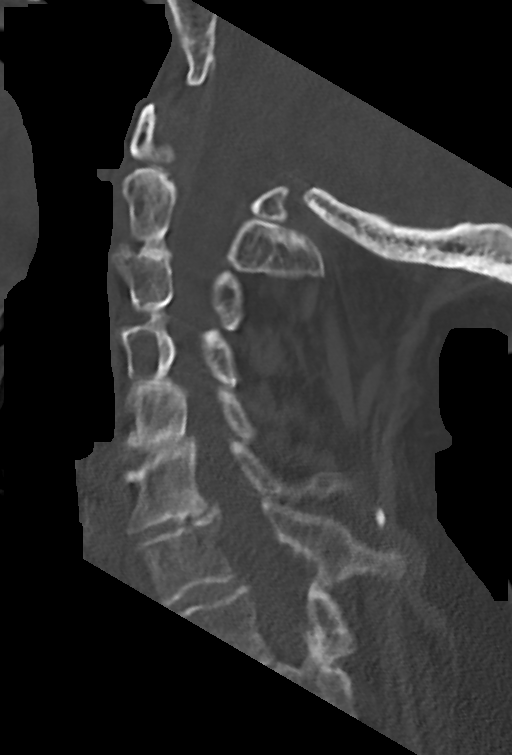
[im 36/45  bone]
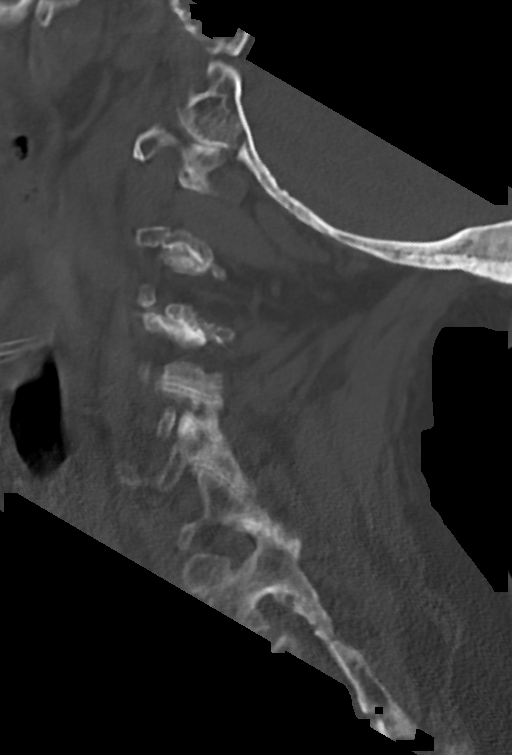

[Series 11: cor bone · coronal · 0.22mm/px · 1 of 61 slices shown]
[im 31/61  bone]
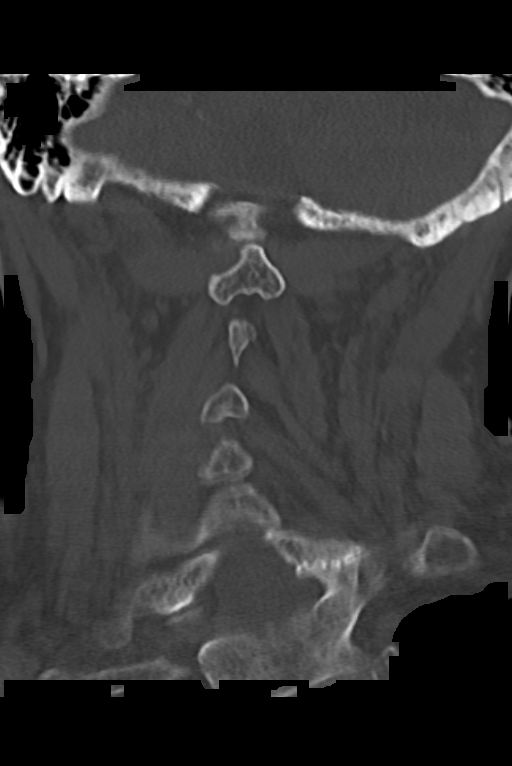

[Series 12: orthogonal axials · oblique · 0.21mm/px · 2 of 71 slices shown, 3 images]
[im 24/71  soft-tissue]
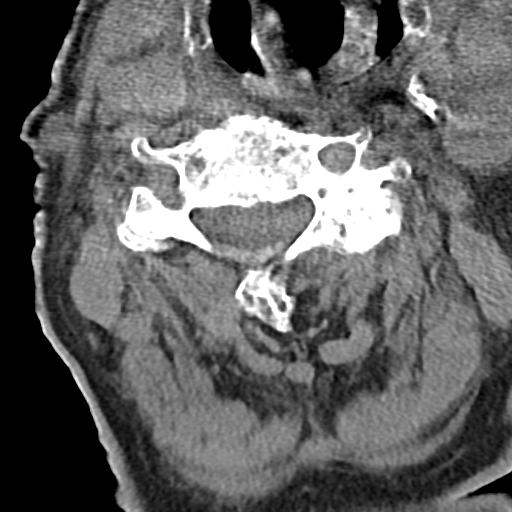
[im 24/71  bone]
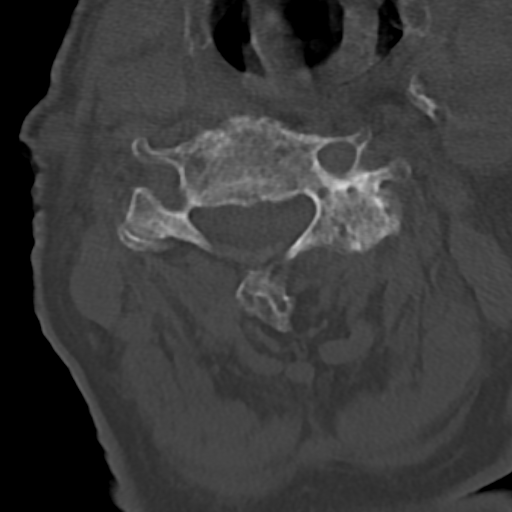
[im 47/71  bone]
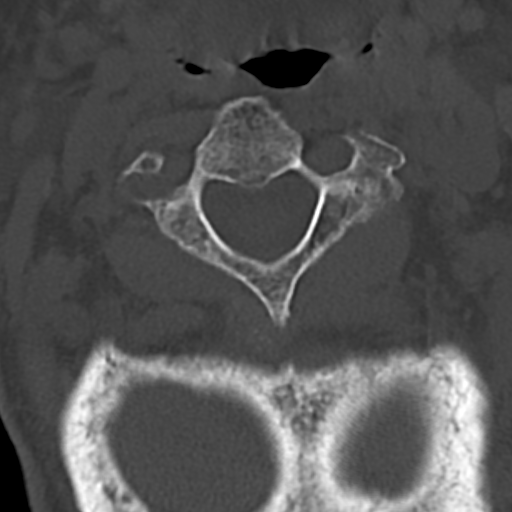

[10 of 33 positions shown; findings below may reference images not displayed]

FINDINGS: CT HEAD FINDINGS

Brain: Diffuse parenchymal atrophy. Patchy areas of hypoattenuation
in deep and periventricular white matter bilaterally. Negative for
acute intracranial hemorrhage, mass lesion, acute infarction,
midline shift, or mass-effect. Acute infarct may be inapparent on
noncontrast CT. Ventricles and sulci symmetric. Bilateral basal
ganglia mineralization.

Vascular: Atherosclerotic and physiologic intracranial
calcifications.

Skull: Normal. Negative for fracture or focal lesion.

Sinuses/Orbits: Mucoperiosteal thickening in the left maxillary
sinus. Missing dentition.

Other: Left frontal scalp soft tissue swelling.

CT CERVICAL SPINE FINDINGS

Alignment: Mild cervical dextroscoliosis. No significant
spondylolisthesis, with some degradation of reconstructions due to
motion.

Skull base and vertebrae: Negative for fracture or focal bone
lesion.

Soft tissues and spinal canal: No visible canal hematoma. No
prevertebral fluid or gas.

Disc levels: Asymmetric facet DJD C2-T1, left worse than right.
Narrowing of interspaces C4-C7 with small endplate spurs.

Upper chest: Negative.

Other: Bilateral calcified carotid bifurcation plaque.
IMPRESSION: 1. Negative for bleed or other acute intracranial process.
2. Atrophy and nonspecific white matter changes.
3. Negative for cervical fracture or other acute bone abnormality.
4. Multilevel cervical degenerative changes as above.
